# Patient Record
Sex: Female | Born: 1990
Health system: Southern US, Community
[De-identification: ages and names within clinical notes are randomized; demographics above are authoritative.]

## PROBLEM LIST (undated history)

## (undated) ENCOUNTER — Inpatient Hospital Stay (HOSPITAL_COMMUNITY): Payer: Self-pay

## (undated) DIAGNOSIS — E876 Hypokalemia: Secondary | ICD-10-CM

## (undated) DIAGNOSIS — M549 Dorsalgia, unspecified: Secondary | ICD-10-CM

## (undated) DIAGNOSIS — F329 Major depressive disorder, single episode, unspecified: Secondary | ICD-10-CM

## (undated) DIAGNOSIS — F32A Depression, unspecified: Secondary | ICD-10-CM

## (undated) DIAGNOSIS — F419 Anxiety disorder, unspecified: Secondary | ICD-10-CM

## (undated) DIAGNOSIS — R87610 Atypical squamous cells of undetermined significance on cytologic smear of cervix (ASC-US): Secondary | ICD-10-CM

## (undated) DIAGNOSIS — M419 Scoliosis, unspecified: Secondary | ICD-10-CM

## (undated) DIAGNOSIS — N739 Female pelvic inflammatory disease, unspecified: Secondary | ICD-10-CM

## (undated) DIAGNOSIS — B977 Papillomavirus as the cause of diseases classified elsewhere: Secondary | ICD-10-CM

## (undated) DIAGNOSIS — G43909 Migraine, unspecified, not intractable, without status migrainosus: Secondary | ICD-10-CM

## (undated) DIAGNOSIS — R87810 Cervical high risk human papillomavirus (HPV) DNA test positive: Secondary | ICD-10-CM

## (undated) DIAGNOSIS — D649 Anemia, unspecified: Secondary | ICD-10-CM

## (undated) HISTORY — PX: DILATION AND CURETTAGE OF UTERUS: SHX78

## (undated) HISTORY — DX: Atypical squamous cells of undetermined significance on cytologic smear of cervix (ASC-US): R87.610

## (undated) HISTORY — DX: Cervical high risk human papillomavirus (HPV) DNA test positive: R87.810

## (undated) HISTORY — PX: NO PAST SURGERIES: SHX2092

## (undated) HISTORY — DX: Hypokalemia: E87.6

---
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
## 2011-03-17 ENCOUNTER — Emergency Department (HOSPITAL_COMMUNITY)
Admission: EM | Admit: 2011-03-17 | Discharge: 2011-03-17 | Disposition: A | Discharge status: Home / Self Care | Payer: No Typology Code available for payment source | Attending: Emergency Medicine | Admitting: Emergency Medicine

## 2011-03-17 DIAGNOSIS — M545 Low back pain, unspecified: Secondary | ICD-10-CM | POA: Insufficient documentation

## 2011-05-06 ENCOUNTER — Encounter (HOSPITAL_COMMUNITY): Payer: Self-pay | Admitting: Radiology

## 2011-05-06 ENCOUNTER — Emergency Department (HOSPITAL_COMMUNITY)
Admission: EM | Admit: 2011-05-06 | Discharge: 2011-05-06 | Disposition: A | Discharge status: Home / Self Care | Payer: No Typology Code available for payment source | Attending: Emergency Medicine | Admitting: Emergency Medicine

## 2011-05-06 ENCOUNTER — Emergency Department (HOSPITAL_COMMUNITY): Payer: No Typology Code available for payment source

## 2011-05-06 DIAGNOSIS — R109 Unspecified abdominal pain: Secondary | ICD-10-CM | POA: Insufficient documentation

## 2011-05-06 DIAGNOSIS — N739 Female pelvic inflammatory disease, unspecified: Secondary | ICD-10-CM | POA: Insufficient documentation

## 2011-05-06 DIAGNOSIS — R300 Dysuria: Secondary | ICD-10-CM | POA: Insufficient documentation

## 2011-05-06 LAB — COMPREHENSIVE METABOLIC PANEL
ALT: 9 U/L (ref 0–35)
AST: 15 U/L (ref 0–37)
Albumin: 4.1 g/dL (ref 3.5–5.2)
Alkaline Phosphatase: 85 U/L (ref 39–117)
BUN: 5 mg/dL — ABNORMAL LOW (ref 6–23)
CO2: 25 mEq/L (ref 19–32)
Calcium: 10 mg/dL (ref 8.4–10.5)
Chloride: 105 mEq/L (ref 96–112)
Creatinine, Ser: 0.69 mg/dL (ref 0.50–1.10)
GFR calc Af Amer: >60 mL/min (ref >60)
GFR calc non Af Amer: >60 mL/min (ref >60)
Glucose, Bld: 101 mg/dL — ABNORMAL HIGH (ref 70–99)
Potassium: 3.4 mEq/L — ABNORMAL LOW (ref 3.5–5.1)
Sodium: 140 mEq/L (ref 135–145)
Total Bilirubin: 0.4 mg/dL (ref 0.3–1.2)
Total Protein: 7.5 g/dL (ref 6.0–8.3)

## 2011-05-06 LAB — POCT PREGNANCY, URINE: Preg Test, Ur: NEGATIVE

## 2011-05-06 LAB — URINALYSIS, ROUTINE W REFLEX MICROSCOPIC
Bilirubin Urine: NEGATIVE
Glucose, UA: NEGATIVE mg/dL
Ketones, ur: NEGATIVE mg/dL
Nitrite: NEGATIVE
Protein, ur: NEGATIVE mg/dL
Specific Gravity, Urine: 1.009 (ref 1.005–1.030)
Urobilinogen, UA: 0.2 mg/dL (ref 0.0–1.0)
pH: 6.5 (ref 5.0–8.0)

## 2011-05-06 LAB — DIFFERENTIAL
Basophils Absolute: 0.1 10*3/uL (ref 0.0–0.1)
Basophils Relative: 0 % (ref 0–1)
Eosinophils Absolute: 0.1 10*3/uL (ref 0.0–0.7)
Eosinophils Relative: 0 % (ref 0–5)
Lymphocytes Relative: 12 % (ref 12–46)
Lymphs Abs: 2.1 10*3/uL (ref 0.7–4.0)
Monocytes Absolute: 1.3 10*3/uL — ABNORMAL HIGH (ref 0.1–1.0)
Monocytes Relative: 8 % (ref 3–12)
Neutro Abs: 13.9 10*3/uL — ABNORMAL HIGH (ref 1.7–7.7)
Neutrophils Relative %: 80 % — ABNORMAL HIGH (ref 43–77)

## 2011-05-06 LAB — CBC
HCT: 37.5 % (ref 36.0–46.0)
Hemoglobin: 12.1 g/dL (ref 12.0–15.0)
MCH: 28.3 pg (ref 26.0–34.0)
MCHC: 32.3 g/dL (ref 30.0–36.0)
MCV: 87.8 fL (ref 78.0–100.0)
Platelets: 346 10*3/uL (ref 150–400)
RBC: 4.27 MIL/uL (ref 3.87–5.11)
RDW: 12.4 % (ref 11.5–15.5)
WBC: 17.4 10*3/uL — ABNORMAL HIGH (ref 4.0–10.5)

## 2011-05-06 LAB — URINE MICROSCOPIC-ADD ON

## 2011-05-06 LAB — WET PREP, GENITAL
Trich, Wet Prep: NONE SEEN
Yeast Wet Prep HPF POC: NONE SEEN

## 2011-05-06 MED ORDER — IOHEXOL 300 MG/ML  SOLN
100.0000 mL | Freq: Once | INTRAMUSCULAR | Status: AC | PRN
  Administered 2011-05-06: 100 mL via INTRAVENOUS

## 2011-05-08 LAB — GC/CHLAMYDIA PROBE AMP, GENITAL
Chlamydia, DNA Probe: NEGATIVE
GC Probe Amp, Genital: NEGATIVE

## 2011-06-14 LAB — URINALYSIS, ROUTINE W REFLEX MICROSCOPIC
Bilirubin Urine: NEGATIVE
Glucose, UA: NEGATIVE
Hgb urine dipstick: NEGATIVE
Ketones, ur: NEGATIVE
Nitrite: NEGATIVE
Protein, ur: NEGATIVE
Specific Gravity, Urine: 1.009
Urobilinogen, UA: 0.2
pH: 7

## 2011-06-14 LAB — URINE MICROSCOPIC-ADD ON

## 2011-06-14 LAB — BASIC METABOLIC PANEL
BUN: 5 — ABNORMAL LOW
CO2: 27
Calcium: 9.5
Chloride: 106
Creatinine, Ser: 0.69
Glucose, Bld: 102 — ABNORMAL HIGH
Potassium: 3.4 — ABNORMAL LOW
Sodium: 140

## 2011-06-14 LAB — PREGNANCY, URINE: Preg Test, Ur: NEGATIVE

## 2011-12-25 ENCOUNTER — Emergency Department (HOSPITAL_BASED_OUTPATIENT_CLINIC_OR_DEPARTMENT_OTHER)
Admission: EM | Admit: 2011-12-25 | Discharge: 2011-12-25 | Disposition: A | Discharge status: Home / Self Care | Payer: No Typology Code available for payment source | Attending: Emergency Medicine | Admitting: Emergency Medicine

## 2011-12-25 ENCOUNTER — Encounter (HOSPITAL_BASED_OUTPATIENT_CLINIC_OR_DEPARTMENT_OTHER): Payer: Self-pay | Admitting: Emergency Medicine

## 2011-12-25 DIAGNOSIS — Z043 Encounter for examination and observation following other accident: Secondary | ICD-10-CM | POA: Insufficient documentation

## 2011-12-25 MED ORDER — TRAMADOL HCL 50 MG PO TABS: 50.0000 mg | ORAL_TABLET | Freq: Four times a day (QID) | ORAL | Status: AC | PRN

## 2011-12-25 MED ORDER — CYCLOBENZAPRINE HCL 10 MG PO TABS: 10.0000 mg | ORAL_TABLET | Freq: Two times a day (BID) | ORAL | Status: AC | PRN
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
## 2011-12-25 NOTE — ED Provider Notes (Signed)
Medical screening examination/treatment/procedure(s) were performed by non-physician practitioner and as supervising physician I was immediately available for consultation/collaboration.   Celene Kras, MD 12/25/11 1330
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
## 2012-04-25 ENCOUNTER — Emergency Department (HOSPITAL_COMMUNITY)
Admission: EM | Admit: 2012-04-25 | Discharge: 2012-04-26 | Disposition: A | Discharge status: Home / Self Care | Payer: No Typology Code available for payment source | Attending: Emergency Medicine | Admitting: Emergency Medicine

## 2012-04-25 ENCOUNTER — Encounter (HOSPITAL_COMMUNITY): Payer: Self-pay

## 2012-04-25 ENCOUNTER — Emergency Department (HOSPITAL_COMMUNITY): Payer: No Typology Code available for payment source

## 2012-04-25 DIAGNOSIS — Y998 Other external cause status: Secondary | ICD-10-CM | POA: Insufficient documentation

## 2012-04-25 DIAGNOSIS — S134XXA Sprain of ligaments of cervical spine, initial encounter: Secondary | ICD-10-CM

## 2012-04-25 DIAGNOSIS — F17200 Nicotine dependence, unspecified, uncomplicated: Secondary | ICD-10-CM | POA: Insufficient documentation

## 2012-04-25 DIAGNOSIS — S139XXA Sprain of joints and ligaments of unspecified parts of neck, initial encounter: Secondary | ICD-10-CM | POA: Insufficient documentation

## 2012-04-25 DIAGNOSIS — Y93I9 Activity, other involving external motion: Secondary | ICD-10-CM | POA: Insufficient documentation

## 2012-04-25 MED ORDER — CYCLOBENZAPRINE HCL 10 MG PO TABS: 10.0000 mg | ORAL_TABLET | Freq: Two times a day (BID) | ORAL | Status: AC | PRN

## 2012-04-25 MED ORDER — HYDROCODONE-ACETAMINOPHEN 5-325 MG PO TABS: 1.0000 | ORAL_TABLET | Freq: Four times a day (QID) | ORAL | Status: AC | PRN

## 2012-04-25 MED ORDER — HYDROCODONE-ACETAMINOPHEN 5-325 MG PO TABS
2.0000 | ORAL_TABLET | Freq: Once | ORAL | Status: AC
  Administered 2012-04-25: 2 via ORAL
  Filled 2012-04-25: qty 2
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
## 2012-04-25 NOTE — ED Notes (Signed)
Cervical collar applied

## 2012-04-28 NOTE — ED Provider Notes (Signed)
Medical screening examination/treatment/procedure(s) were performed by non-physician practitioner and as supervising physician I was immediately available for consultation/collaboration.  Solomiya Pascale, MD 04/28/12 1508 

## 2012-05-16 ENCOUNTER — Encounter (HOSPITAL_BASED_OUTPATIENT_CLINIC_OR_DEPARTMENT_OTHER): Payer: Self-pay

## 2012-05-16 ENCOUNTER — Emergency Department (HOSPITAL_BASED_OUTPATIENT_CLINIC_OR_DEPARTMENT_OTHER)
Admission: EM | Admit: 2012-05-16 | Discharge: 2012-05-16 | Disposition: A | Discharge status: Home / Self Care | Payer: BC Managed Care – PPO | Attending: Emergency Medicine | Admitting: Emergency Medicine

## 2012-05-16 DIAGNOSIS — L089 Local infection of the skin and subcutaneous tissue, unspecified: Secondary | ICD-10-CM | POA: Insufficient documentation

## 2012-05-16 DIAGNOSIS — F17200 Nicotine dependence, unspecified, uncomplicated: Secondary | ICD-10-CM | POA: Insufficient documentation

## 2012-05-16 MED ORDER — SULFAMETHOXAZOLE-TRIMETHOPRIM 800-160 MG PO TABS: 1.0000 | ORAL_TABLET | Freq: Two times a day (BID) | ORAL | Status: AC

## 2012-05-16 MED ORDER — HYDROCODONE-ACETAMINOPHEN 5-325 MG PO TABS: 2.0000 | ORAL_TABLET | ORAL | Status: AC | PRN

## 2012-05-16 MED ORDER — CHLORHEXIDINE GLUCONATE 4 % EX LIQD: 60.0000 mL | Freq: Every day | CUTANEOUS | Status: AC | PRN

## 2012-05-16 NOTE — ED Notes (Signed)
Pt reports "i found a blood blister on my right breast" 4 days ago

## 2012-05-16 NOTE — ED Provider Notes (Signed)
Medical screening examination/treatment/procedure(s) were performed by non-physician practitioner and as supervising physician I was immediately available for consultation/collaboration.   Richardean Canal, MD 05/16/12 [phone]

## 2012-05-16 NOTE — ED Provider Notes (Signed)
History     CSN: [id number]  Arrival date & time 05/16/12  1135   First MD Initiated Contact with Patient 05/16/12 1205      Chief Complaint  Patient presents with  . Breast Pain    (Consider location/radiation/quality/duration/timing/severity/associated sxs/prior treatment) Patient is a 21 y.o. female presenting with rash. The history is provided by the patient. No language interpreter was used.  Rash  This is a new problem. The problem has been gradually worsening. The problem is associated with nothing. There has been no fever. The fever has been present for 1 to 2 days. The rash is present on the torso. The pain is at a severity of 5/10. The pain is moderate. The pain has been constant since onset. Associated symptoms include blisters. She has tried nothing for the symptoms. The treatment provided no relief.  Pt complains of a swollen area 2 right breast.   History reviewed. No pertinent past medical history.  History reviewed. No pertinent past surgical history.  No family history on file.  History  Substance Use Topics  . Smoking status: Current Everyday Smoker  . Smokeless tobacco: Not on file  . Alcohol Use: No    OB History    Grav Para Term Preterm Abortions TAB SAB Ect Mult Living                  Review of Systems  Skin: Positive for rash.  All other systems reviewed and are negative.    Allergies  Review of patient's allergies indicates no known allergies.  Home Medications   Current Outpatient Rx  Name Route Sig Dispense Refill  . CHLORHEXIDINE GLUCONATE 4 % EX LIQD Topical Apply 60 mLs (4 application total) topically daily as needed. 120 mL 0  . HYDROCODONE-ACETAMINOPHEN 5-325 MG PO TABS Oral Take 2 tablets by mouth every 4 (four) hours as needed for pain. 10 tablet 0  . SULFAMETHOXAZOLE-TRIMETHOPRIM 800-160 MG PO TABS Oral Take 1 tablet by mouth 2 (two) times daily. 20 tablet 0    BP 116/70  Pulse 93  Resp 16  Ht 5\' 1"  (1.549 m)  Wt 102 lb  (46.267 kg)  BMI 19.27 kg/m2  SpO2 100%  LMP 04/19/2012  Physical Exam  Vitals reviewed. Constitutional: She is oriented to person, place, and time. She appears well-developed and well-nourished.  HENT:  Head: Normocephalic and atraumatic.  Eyes: EOM are normal.  Neck: Normal range of motion.  Pulmonary/Chest: Effort normal.       Pimples right breast, swollen red area, no abscess  Abdominal: She exhibits no distension.  Musculoskeletal: Normal range of motion.  Neurological: She is alert and oriented to person, place, and time.  Psychiatric: She has a normal mood and affect.    ED Course  Procedures (including critical care time)  Labs Reviewed - No data to display No results found.   1. Skin infection       MDM  Pt has multiple pimples and pustules,  Pt given rx for bactrim and hibiclens rs       Lonia Skinner [place], [place] 05/16/12 1229

## 2012-07-23 ENCOUNTER — Encounter (HOSPITAL_BASED_OUTPATIENT_CLINIC_OR_DEPARTMENT_OTHER): Payer: Self-pay | Admitting: Student

## 2012-07-23 ENCOUNTER — Emergency Department (HOSPITAL_BASED_OUTPATIENT_CLINIC_OR_DEPARTMENT_OTHER)
Admission: EM | Admit: 2012-07-23 | Discharge: 2012-07-23 | Disposition: A | Discharge status: Home / Self Care | Payer: BC Managed Care – PPO | Attending: Emergency Medicine | Admitting: Emergency Medicine

## 2012-07-23 DIAGNOSIS — F17200 Nicotine dependence, unspecified, uncomplicated: Secondary | ICD-10-CM | POA: Insufficient documentation

## 2012-07-23 DIAGNOSIS — N649 Disorder of breast, unspecified: Secondary | ICD-10-CM | POA: Insufficient documentation

## 2012-07-23 DIAGNOSIS — N644 Mastodynia: Secondary | ICD-10-CM

## 2012-07-23 NOTE — ED Provider Notes (Signed)
History     CSN: [id number]  Arrival date & time 07/23/12  1204   First MD Initiated Contact with Patient 07/23/12 1244      Chief Complaint  Patient presents with  . Breast Discharge  . Breast Problem    Swelling to Right lower breast    (Consider location/radiation/quality/duration/timing/severity/associated sxs/prior treatment) HPI Comments: Pt complains of a swollen area right breat.  Pt reports area is tender and she feels a knot.  Pt reports I saw her in August and treated with antibiotics.  Pt reports area has continued to stay swollen.  Pt reports discharge from that breast.  Discharge started 3 weeks ago.  Pt denies any fever or chills.    The history is provided by the patient. No language interpreter was used.    History reviewed. No pertinent past medical history.  History reviewed. No pertinent past surgical history.  History reviewed. No pertinent family history.  History  Substance Use Topics  . Smoking status: Current Every Day Smoker  . Smokeless tobacco: Not on file  . Alcohol Use: No    OB History    Grav Para Term Preterm Abortions TAB SAB Ect Mult Living                  Review of Systems  All other systems reviewed and are negative.    Allergies  Review of patient's allergies indicates no known allergies.  Home Medications  No current outpatient prescriptions on file.  BP 101/82  Pulse 89  Temp 98.4 F (36.9 C) (Oral)  Resp 16  Ht 5' (1.524 m)  Wt 102 lb (46.267 kg)  BMI 19.92 kg/m2  SpO2 100%  LMP 07/23/2012  Physical Exam  Nursing note and vitals reviewed. Constitutional: She is oriented to person, place, and time. She appears well-developed and well-nourished.  HENT:  Head: Normocephalic.  Cardiovascular: Normal rate and normal heart sounds.   Pulmonary/Chest: Effort normal.       Right breast no obvious mass,  Possible increased area of swelling.    Neurological: She is alert and oriented to person, place, and time. She  has normal reflexes.  Skin: Skin is warm.    ED Course  Procedures (including critical care time)  Labs Reviewed - No data to display No results found.   1. Breast pain, right       MDM  Dr. Karma Ganja in to see.   Pt advised to follow up at the breast clinic for further evaluation.   Pt given primary care referral sheet.        Lonia Skinner [place], [place] 07/23/12 1431

## 2012-07-23 NOTE — ED Provider Notes (Signed)
Medical screening examination/treatment/procedure(s) were conducted as a shared visit with non-physician practitioner(s) and myself.  I personally evaluated the patient during the encounter  Pt seen and evaluated, approx 2cm area of prominence on upper right breast, no overlying erythema, no discharge expressed from nipple.  Pt referred to breast center for further imaging and evaluation.    Ethelda Chick, MD 07/23/12 [phone]

## 2012-07-23 NOTE — ED Notes (Signed)
Pt in with c/o right breast swelling with clear dc since aug 2013. Reports change to milky dc x 2-3 weeks. Right lower breast swelling with possible "knot" noted by pt. Pt unsure if "knot" is fixed or freely moveable. Pt reports tenderness to palpation.

## 2012-07-27 ENCOUNTER — Telehealth (HOSPITAL_BASED_OUTPATIENT_CLINIC_OR_DEPARTMENT_OTHER): Payer: Self-pay | Admitting: *Deleted

## 2012-07-27 NOTE — ED Provider Notes (Signed)
Patient has been told that she cannot be seen at [facility] until she has had an ultrasound of her breast. Ultrasound has been ordered.  Dione Booze, MD 07/27/12 [phone]

## 2012-07-27 NOTE — ED Notes (Signed)
Patient called to state she was unable to schedule an appointment with [facility] due to lack of breast Ultrasound.  Chart reviewed with Dr. Preston Fleeting.  Order placed for [place] of right breast for breast pain.  Call placed to [facility] with referral.  Call placed to patient with info and phone #.

## 2012-07-28 ENCOUNTER — Ambulatory Visit
Admission: RE | Admit: 2012-07-28 | Discharge: 2012-07-28 | Disposition: A | Discharge status: Home / Self Care | Payer: BC Managed Care – PPO | Source: Ambulatory Visit | Attending: Emergency Medicine | Admitting: Emergency Medicine

## 2012-07-28 DIAGNOSIS — N644 Mastodynia: Secondary | ICD-10-CM

## 2012-08-23 ENCOUNTER — Emergency Department (HOSPITAL_BASED_OUTPATIENT_CLINIC_OR_DEPARTMENT_OTHER)
Admission: EM | Admit: 2012-08-23 | Discharge: 2012-08-23 | Disposition: A | Discharge status: Home / Self Care | Payer: BC Managed Care – PPO | Attending: Emergency Medicine | Admitting: Emergency Medicine

## 2012-08-23 ENCOUNTER — Encounter (HOSPITAL_BASED_OUTPATIENT_CLINIC_OR_DEPARTMENT_OTHER): Payer: Self-pay | Admitting: *Deleted

## 2012-08-23 DIAGNOSIS — Y929 Unspecified place or not applicable: Secondary | ICD-10-CM | POA: Insufficient documentation

## 2012-08-23 DIAGNOSIS — L089 Local infection of the skin and subcutaneous tissue, unspecified: Secondary | ICD-10-CM

## 2012-08-23 DIAGNOSIS — IMO0002 Reserved for concepts with insufficient information to code with codable children: Secondary | ICD-10-CM

## 2012-08-23 DIAGNOSIS — W269XXA Contact with unspecified sharp object(s), initial encounter: Secondary | ICD-10-CM | POA: Insufficient documentation

## 2012-08-23 DIAGNOSIS — Y939 Activity, unspecified: Secondary | ICD-10-CM | POA: Insufficient documentation

## 2012-08-23 DIAGNOSIS — S61209A Unspecified open wound of unspecified finger without damage to nail, initial encounter: Secondary | ICD-10-CM | POA: Insufficient documentation

## 2012-08-23 DIAGNOSIS — F17200 Nicotine dependence, unspecified, uncomplicated: Secondary | ICD-10-CM | POA: Insufficient documentation

## 2012-08-23 MED ORDER — CEPHALEXIN 500 MG PO CAPS: 500.0000 mg | ORAL_CAPSULE | Freq: Two times a day (BID) | ORAL | Status: DC

## 2012-08-23 MED ORDER — LIDOCAINE HCL 2 % IJ SOLN
20.0000 mL | Freq: Once | INTRAMUSCULAR | Status: AC
  Administered 2012-08-23: 400 mg
  Filled 2012-08-23: qty 20

## 2012-08-23 NOTE — ED Provider Notes (Signed)
History     CSN: [id number]  Arrival date & time 08/23/12  1437   First MD Initiated Contact with Patient 08/23/12 1619      Chief Complaint  Patient presents with  . Wound Infection    (Consider location/radiation/quality/duration/timing/severity/associated sxs/prior treatment) HPI Comments: Pt states that she had a piercing to the left middle finger and the area has started to have some redness:pt is here to have the piercing removed  The history is provided by the patient. No language interpreter was used.    History reviewed. No pertinent past medical history.  History reviewed. No pertinent past surgical history.  History reviewed. No pertinent family history.  History  Substance Use Topics  . Smoking status: Current Every Day Smoker  . Smokeless tobacco: Not on file  . Alcohol Use: No    OB History    Grav Para Term Preterm Abortions TAB SAB Ect Mult Living                  Review of Systems  Constitutional: Negative.   Respiratory: Negative.   Cardiovascular: Negative.     Allergies  Review of patient's allergies indicates no known allergies.  Home Medications  No current outpatient prescriptions on file.  BP 109/70  Pulse 71  Temp 98.3 F (36.8 C)  Resp 16  Ht 5' (1.524 m)  Wt 99 lb (44.906 kg)  BMI 19.33 kg/m2  SpO2 100%  LMP 08/23/2012  Physical Exam  Nursing note and vitals reviewed. Cardiovascular: Normal rate and regular rhythm.   Pulmonary/Chest: Effort normal and breath sounds normal.  Musculoskeletal:       Pt has localized redness or dorsal aspect of the base of left middle finger:pt has full rom without drainage:pt has a piercing to the area    ED Course  FOREIGN BODY REMOVAL Performed by: Teressa Lower Authorized by: Teressa Lower Consent: Verbal consent obtained. Consent given by: patient Patient identity confirmed: verbally with patient Time out: Immediately prior to procedure a "time out" was called to verify  the correct patient, procedure, equipment, support staff and site/side marked as required. Body area: skin General location: upper extremity Location details: left long finger Anesthesia: digital block Local anesthetic: lidocaine 2% without epinephrine Removal mechanism: scalpel Dressing: dressing applied and antibiotic ointment Depth: subcutaneous Complexity: simple Post-procedure assessment: foreign body removed Patient tolerance: Patient tolerated the procedure well with no immediate complications.   (including critical care time)  Labs Reviewed - No data to display No results found.   1. Skin infection   2. Foreign body       MDM  pts tetanus is NWG:NFAOZHYQ removed        Teressa Lower, NP 08/23/12 1728

## 2012-08-23 NOTE — ED Notes (Signed)
Pt c/o left 3rd finger infection from dermal piercing .

## 2012-08-23 NOTE — ED Provider Notes (Signed)
Medical screening examination/treatment/procedure(s) were performed by non-physician practitioner and as supervising physician I was immediately available for consultation/collaboration.   Jakobi Thetford, MD 08/23/12 2315 

## 2012-12-18 ENCOUNTER — Encounter (HOSPITAL_BASED_OUTPATIENT_CLINIC_OR_DEPARTMENT_OTHER): Payer: Self-pay | Admitting: Student

## 2012-12-18 ENCOUNTER — Emergency Department (HOSPITAL_BASED_OUTPATIENT_CLINIC_OR_DEPARTMENT_OTHER)
Admission: EM | Admit: 2012-12-18 | Discharge: 2012-12-18 | Disposition: A | Discharge status: Home / Self Care | Payer: BC Managed Care – PPO | Attending: Emergency Medicine | Admitting: Emergency Medicine

## 2012-12-18 DIAGNOSIS — F17200 Nicotine dependence, unspecified, uncomplicated: Secondary | ICD-10-CM | POA: Insufficient documentation

## 2012-12-18 DIAGNOSIS — G5600 Carpal tunnel syndrome, unspecified upper limb: Secondary | ICD-10-CM | POA: Insufficient documentation

## 2012-12-18 DIAGNOSIS — R209 Unspecified disturbances of skin sensation: Secondary | ICD-10-CM | POA: Insufficient documentation

## 2012-12-18 DIAGNOSIS — G5601 Carpal tunnel syndrome, right upper limb: Secondary | ICD-10-CM

## 2012-12-18 MED ORDER — IBUPROFEN 800 MG PO TABS: 800.0000 mg | ORAL_TABLET | Freq: Three times a day (TID) | ORAL | Status: DC | PRN

## 2012-12-18 NOTE — ED Provider Notes (Signed)
History     CSN: [id number]  Arrival date & time 12/18/12  1056   First MD Initiated Contact with Patient 12/18/12 1117      Chief Complaint  Patient presents with  . Wrist Pain    right wrist    (Consider location/radiation/quality/duration/timing/severity/associated sxs/prior treatment) Patient is a 22 y.o. female presenting with wrist pain.  Wrist Pain   Pt reports increasing pain in R wrist for the last 3-4 weeks, now associated with intermittent numbness in R hand. Pain occasionally radiates up her arm, worse at night and while she is at work. Denies any trauma/injury. No prior history of same.   History reviewed. No pertinent past medical history.  History reviewed. No pertinent past surgical history.  History reviewed. No pertinent family history.  History  Substance Use Topics  . Smoking status: Current Every Day Smoker  . Smokeless tobacco: Not on file  . Alcohol Use: No    OB History   Grav Para Term Preterm Abortions TAB SAB Ect Mult Living                  Review of Systems All other systems reviewed and are negative except as noted in HPI.   Allergies  Review of patient's allergies indicates no known allergies.  Home Medications  No current outpatient prescriptions on file.  BP 114/65  Pulse 59  Temp(Src) 98.8 F (37.1 C) (Oral)  Resp 16  Ht 5' (1.524 m)  Wt 100 lb (45.36 kg)  BMI 19.53 kg/m2  SpO2 100%  LMP 11/18/2012  Physical Exam  Constitutional: She is oriented to person, place, and time. She appears well-developed and well-nourished.  HENT:  Head: Normocephalic and atraumatic.  Neck: Neck supple.  Pulmonary/Chest: Effort normal.  Musculoskeletal: Normal range of motion. She exhibits tenderness (Positive Phalen's and Tinnel's signs). She exhibits no edema.  Distribution of paresthesias is not consistent with median nerve but she is unsure where the numbness was previously. Now only tip of R middle finger  Neurological: She is  alert and oriented to person, place, and time. No cranial nerve deficit.  Psychiatric: She has a normal mood and affect. Her behavior is normal.    ED Course  Procedures (including critical care time)  Labs Reviewed - No data to display No results found.   1. Carpal tunnel syndrome, right       MDM  Likely carpal tunnel syndrome. Advised wrist splint, NSAIDs, rest and Hand followup if not improving.         Charles B. Bernette Mayers, MD 12/18/12 1144

## 2012-12-18 NOTE — ED Notes (Signed)
Pt in with c/o right wrist pain x 2 weeks with reports of numbness and tingling in right hand and finds this past weekend. Pt able to move right hand and fingers without incident.

## 2013-03-06 ENCOUNTER — Encounter (HOSPITAL_BASED_OUTPATIENT_CLINIC_OR_DEPARTMENT_OTHER): Payer: Self-pay

## 2013-03-06 ENCOUNTER — Emergency Department (HOSPITAL_BASED_OUTPATIENT_CLINIC_OR_DEPARTMENT_OTHER)
Admission: EM | Admit: 2013-03-06 | Discharge: 2013-03-06 | Disposition: A | Discharge status: Home / Self Care | Payer: BC Managed Care – PPO | Attending: Emergency Medicine | Admitting: Emergency Medicine

## 2013-03-06 DIAGNOSIS — G43909 Migraine, unspecified, not intractable, without status migrainosus: Secondary | ICD-10-CM | POA: Insufficient documentation

## 2013-03-06 DIAGNOSIS — F17200 Nicotine dependence, unspecified, uncomplicated: Secondary | ICD-10-CM | POA: Insufficient documentation

## 2013-03-06 DIAGNOSIS — G8929 Other chronic pain: Secondary | ICD-10-CM | POA: Insufficient documentation

## 2013-03-06 MED ORDER — SUMATRIPTAN SUCCINATE 100 MG PO TABS: 100.0000 mg | ORAL_TABLET | ORAL | Status: DC | PRN

## 2013-03-06 NOTE — ED Notes (Signed)
C/o HA across forehead and "down into my sinuses" x 2 days-deneis n/v, +photophobia

## 2013-03-06 NOTE — ED Provider Notes (Signed)
History    CSN: [id number] Arrival date & time 03/06/13  1344 First MD Initiated Contact with Patient 03/06/13 1400     Chief Complaint  Patient presents with  . Headache   HPI Comments: Headaches ongoing for over a year and a half.  They will last days at a time.  Usually one to two times per month.  When they occur they last for days.    Patient is a 22 y.o. female presenting with headaches. The history is provided by the patient.  Headache Pain location:  Frontal Radiates to: moves around sinuses and eyes. Timing:  Constant Chronicity:  Chronic Similar to prior headaches: yes   Context: bright light and loud noise   Associated symptoms: no fever and no neck pain    the patient has never seen anyone for this trouble before. Her mother finally convinced her to get this checked out. It hasn't particularly changed. History reviewed. No pertinent past medical history.  History reviewed. No pertinent past surgical history.  No family history on file.  History  Substance Use Topics  . Smoking status: Current Every Day Smoker  . Smokeless tobacco: Not on file  . Alcohol Use: No    OB History   Grav Para Term Preterm Abortions TAB SAB Ect Mult Living                  Review of Systems  Constitutional: Negative for fever.  HENT: Negative for neck pain.   Neurological: Positive for headaches.  All other systems reviewed and are negative.    Allergies  Review of patient's allergies indicates no known allergies.  Home Medications   Current Outpatient Rx  Name  Route  Sig  Dispense  Refill  . NAPROXEN PO   Oral   Take by mouth.         Marland Kitchen ibuprofen (ADVIL,MOTRIN) 800 MG tablet   Oral   Take 1 tablet (800 mg total) by mouth every 8 (eight) hours as needed for pain.   30 tablet   0     BP 122/76  Pulse 61  Temp(Src) 98.1 F (36.7 C) (Oral)  Resp 20  SpO2 100%  LMP 02/21/2013  Physical Exam  Nursing note and vitals reviewed. Constitutional: She is  oriented to person, place, and time. She appears well-developed and well-nourished. No distress.  HENT:  Head: Normocephalic and atraumatic.  Right Ear: External ear normal.  Left Ear: External ear normal.  Mouth/Throat: Oropharynx is clear and moist.  Eyes: Conjunctivae are normal. Right eye exhibits no discharge. Left eye exhibits no discharge. No scleral icterus.  Neck: Neck supple. No tracheal deviation present.  Cardiovascular: Normal rate, regular rhythm and intact distal pulses.   Pulmonary/Chest: Effort normal and breath sounds normal. No stridor. No respiratory distress. She has no wheezes. She has no rales.  Abdominal: Soft. Bowel sounds are normal. She exhibits no distension. There is no tenderness. There is no rebound and no guarding.  Musculoskeletal: She exhibits no edema and no tenderness.  Neurological: She is alert and oriented to person, place, and time. She has normal strength. No cranial nerve deficit ( no gross defecits noted) or sensory deficit. She exhibits normal muscle tone. She displays no seizure activity. Coordination normal.  No pronator drift bilateral upper extrem, able to hold both legs off bed for 5 seconds, sensation intact in all extremities, no visual field cuts, no left or right sided neglect  Skin: Skin is warm and dry. No rash  noted.  Psychiatric: She has a normal mood and affect.    ED Course  Procedures (including critical care time)  Labs Reviewed - No data to display No results found.    MDM  patient does have a normal neurologic exam. She does not appear to be in any distress. I doubt brain tumor, acute infection, subarachnoid hemorrhage or other emergency medical condition. I suspect she may have migraine headaches considering the chronicity and pattern she describes. I will discharge her home on a course of Imitrex. The primary care doctor or neurologist to make sure this treatment is effective and to discuss other options       Celene Kras, MD 03/06/13 [id number]

## 2013-04-24 ENCOUNTER — Encounter (HOSPITAL_BASED_OUTPATIENT_CLINIC_OR_DEPARTMENT_OTHER): Payer: Self-pay | Admitting: *Deleted

## 2013-04-24 ENCOUNTER — Emergency Department (HOSPITAL_BASED_OUTPATIENT_CLINIC_OR_DEPARTMENT_OTHER)
Admission: EM | Admit: 2013-04-24 | Discharge: 2013-04-24 | Disposition: A | Discharge status: Home / Self Care | Payer: BC Managed Care – PPO | Attending: Emergency Medicine | Admitting: Emergency Medicine

## 2013-04-24 DIAGNOSIS — Y92838 Other recreation area as the place of occurrence of the external cause: Secondary | ICD-10-CM | POA: Insufficient documentation

## 2013-04-24 DIAGNOSIS — T148XXA Other injury of unspecified body region, initial encounter: Secondary | ICD-10-CM

## 2013-04-24 DIAGNOSIS — F17200 Nicotine dependence, unspecified, uncomplicated: Secondary | ICD-10-CM | POA: Insufficient documentation

## 2013-04-24 DIAGNOSIS — X58XXXA Exposure to other specified factors, initial encounter: Secondary | ICD-10-CM | POA: Insufficient documentation

## 2013-04-24 DIAGNOSIS — S335XXA Sprain of ligaments of lumbar spine, initial encounter: Secondary | ICD-10-CM | POA: Insufficient documentation

## 2013-04-24 DIAGNOSIS — Y9239 Other specified sports and athletic area as the place of occurrence of the external cause: Secondary | ICD-10-CM | POA: Insufficient documentation

## 2013-04-24 DIAGNOSIS — Y9389 Activity, other specified: Secondary | ICD-10-CM | POA: Insufficient documentation

## 2013-04-24 MED ORDER — CYCLOBENZAPRINE HCL 5 MG PO TABS: 5.0000 mg | ORAL_TABLET | Freq: Two times a day (BID) | ORAL | Status: DC | PRN

## 2013-04-24 NOTE — ED Notes (Signed)
Lower back and right shoulder pain x 3 days since going to amusement park. Things she was slung around on the rides causing the pain.

## 2013-04-24 NOTE — ED Provider Notes (Signed)
  CSN: [id number]     Arrival date & time 04/24/13  1423 History     First MD Initiated Contact with Patient 04/24/13 1440     Chief Complaint  Patient presents with  . Back Pain   (Consider location/radiation/quality/duration/timing/severity/associated sxs/prior Treatment) HPI Comments: Pt states that she was riding rides 5 days ago  And has had pain for the last 4 days  Patient is a 22 y.o. female presenting with back pain. The history is provided by the patient. No language interpreter was used.  Back Pain Location:  Lumbar spine Quality:  Aching Radiates to:  Does not radiate Pain severity:  Moderate Onset quality:  Gradual Duration:  2 days Timing:  Constant Progression:  Unable to specify Relieved by:  Nothing Worsened by:  Nothing tried   History reviewed. No pertinent past medical history. History reviewed. No pertinent past surgical history. No family history on file. History  Substance Use Topics  . Smoking status: Current Every Day Smoker  . Smokeless tobacco: Not on file  . Alcohol Use: No   OB History   Grav Para Term Preterm Abortions TAB SAB Ect Mult Living                 Review of Systems  Constitutional: Negative.   Respiratory: Negative.   Cardiovascular: Negative.   Musculoskeletal: Positive for back pain.    Allergies  Review of patient's allergies indicates no known allergies.  Home Medications   Current Outpatient Rx  Name  Route  Sig  Dispense  Refill  . cyclobenzaprine (FLEXERIL) 5 MG tablet   Oral   Take 1 tablet (5 mg total) by mouth 2 (two) times daily as needed for muscle spasms.   20 tablet   0   . ibuprofen (ADVIL,MOTRIN) 800 MG tablet   Oral   Take 1 tablet (800 mg total) by mouth every 8 (eight) hours as needed for pain.   30 tablet   0   . NAPROXEN PO   Oral   Take by mouth.         . SUMAtriptan (IMITREX) 100 MG tablet   Oral   Take 1 tablet (100 mg total) by mouth every 2 (two) hours as needed for migraine.  Max 200 mg per 24 hour period   10 tablet   0    BP 124/84  Pulse 96  Temp(Src) 98.7 F (37.1 C) (Oral)  Resp 20  Ht 5' (1.524 m)  Wt 100 lb (45.36 kg)  BMI 19.53 kg/m2  SpO2 100%  LMP 04/20/2013 Physical Exam  Nursing note and vitals reviewed. Constitutional: She is oriented to person, place, and time. She appears well-developed and well-nourished.  HENT:  Head: Normocephalic and atraumatic.  Eyes: EOM are normal.  Neck: Neck supple.  Cardiovascular: Normal rate and regular rhythm.   Pulmonary/Chest: Effort normal and breath sounds normal.  Musculoskeletal: Normal range of motion.  Lumbar paraspinal tenderness:posterior right shoulder pain:pt has full rom  Neurological: She is alert and oriented to person, place, and time.  Skin: Skin is warm and dry.  Psychiatric: She has a normal mood and affect.    ED Course   Procedures (including critical care time)  Labs Reviewed - No data to display No results found. 1. Muscle strain     MDM  Will treat symptomatically:pt doesn't need imaging at this time  Teressa Lower, NP 04/24/13 1503

## 2013-04-24 NOTE — ED Provider Notes (Signed)
Medical screening examination/treatment/procedure(s) were performed by non-physician practitioner and as supervising physician I was immediately available for consultation/collaboration.  Derwood Kaplan, MD 04/24/13 1616

## 2013-05-11 ENCOUNTER — Ambulatory Visit: Payer: BC Managed Care – PPO | Admitting: Family Medicine

## 2013-06-04 ENCOUNTER — Emergency Department (HOSPITAL_BASED_OUTPATIENT_CLINIC_OR_DEPARTMENT_OTHER)
Admission: EM | Admit: 2013-06-04 | Discharge: 2013-06-04 | Disposition: A | Discharge status: Home / Self Care | Payer: BC Managed Care – PPO | Attending: Emergency Medicine | Admitting: Emergency Medicine

## 2013-06-04 ENCOUNTER — Encounter (HOSPITAL_BASED_OUTPATIENT_CLINIC_OR_DEPARTMENT_OTHER): Payer: Self-pay | Admitting: Student

## 2013-06-04 DIAGNOSIS — B9789 Other viral agents as the cause of diseases classified elsewhere: Secondary | ICD-10-CM

## 2013-06-04 DIAGNOSIS — R509 Fever, unspecified: Secondary | ICD-10-CM | POA: Insufficient documentation

## 2013-06-04 DIAGNOSIS — F17200 Nicotine dependence, unspecified, uncomplicated: Secondary | ICD-10-CM | POA: Insufficient documentation

## 2013-06-04 DIAGNOSIS — R110 Nausea: Secondary | ICD-10-CM | POA: Insufficient documentation

## 2013-06-04 DIAGNOSIS — J029 Acute pharyngitis, unspecified: Secondary | ICD-10-CM | POA: Insufficient documentation

## 2013-06-04 DIAGNOSIS — J988 Other specified respiratory disorders: Secondary | ICD-10-CM | POA: Insufficient documentation

## 2013-06-04 DIAGNOSIS — R5381 Other malaise: Secondary | ICD-10-CM | POA: Insufficient documentation

## 2013-06-04 LAB — CBC
HCT: 41 % (ref 36.0–46.0)
Hemoglobin: 13.4 g/dL (ref 12.0–15.0)
RBC: 4.54 MIL/uL (ref 3.87–5.11)
WBC: 5.8 10*3/uL (ref 4.0–10.5)

## 2013-06-04 NOTE — ED Provider Notes (Signed)
CSN: [id number]     Arrival date & time 06/04/13  1251 History   First MD Initiated Contact with Patient 06/04/13 1337     Chief Complaint  Patient presents with  . URI   (Consider location/radiation/quality/duration/timing/severity/associated sxs/prior Treatment) HPI Complains of cough nasal congestion sore throat, fatigue and nausea onset 4 days ago. Symptoms accompanied by subjective fever. No vomiting. No abdominal pain. No diarrhea. No chest pain. No other associated symptoms. . Sore throat is worse with swallowing. Nothing makes other symptoms better or worse No treatment prior to coming here History reviewed. No pertinent past medical history. History reviewed. No pertinent past surgical history. past medical history is negative History reviewed. No pertinent family history. History  Substance Use Topics  . Smoking status: Current Every Day Smoker  . Smokeless tobacco: Not on file  . Alcohol Use: No   OB History   Grav Para Term Preterm Abortions TAB SAB Ect Mult Living                 Review of Systems  Constitutional: Positive for fatigue.  HENT: Positive for congestion and sore throat.   Respiratory: Negative.   Cardiovascular: Negative.   Gastrointestinal: Positive for nausea.  Musculoskeletal: Negative.   Skin: Negative.   Neurological: Negative.   Psychiatric/Behavioral: Negative.   All other systems reviewed and are negative.    Allergies  Review of patient's allergies indicates no known allergies.  Home Medications   Current Outpatient Rx  Name  Route  Sig  Dispense  Refill  . cyclobenzaprine (FLEXERIL) 5 MG tablet   Oral   Take 1 tablet (5 mg total) by mouth 2 (two) times daily as needed for muscle spasms.   20 tablet   0   . ibuprofen (ADVIL,MOTRIN) 800 MG tablet   Oral   Take 1 tablet (800 mg total) by mouth every 8 (eight) hours as needed for pain.   30 tablet   0   . NAPROXEN PO   Oral   Take by mouth.         . SUMAtriptan (IMITREX)  100 MG tablet   Oral   Take 1 tablet (100 mg total) by mouth every 2 (two) hours as needed for migraine. Max 200 mg per 24 hour period   10 tablet   0    BP 118/70  Pulse 60  Temp(Src) 99.5 F (37.5 C) (Oral)  Resp 20  Wt 100 lb (45.36 kg)  BMI 19.53 kg/m2  LMP 05/21/2013 Physical Exam  Nursing note and vitals reviewed. Constitutional: She appears well-developed and well-nourished. No distress.  HENT:  Head: Normocephalic and atraumatic.  Right Ear: External ear normal.  Left Ear: External ear normal.  Nose: Nose normal.  Mouth/Throat: No oropharyngeal exudate.  Oral pharynx reddened uvula midline. Handling secretions well. Bilateral tympanic membranes normal  Eyes: Conjunctivae are normal. Pupils are equal, round, and reactive to light.  Neck: Neck supple. No tracheal deviation present. No thyromegaly present.  Cardiovascular: Normal rate and regular rhythm.   No murmur heard. Pulmonary/Chest: Effort normal and breath sounds normal.  Abdominal: Soft. Bowel sounds are normal. She exhibits no distension. There is no tenderness.  No splenomegaly  Musculoskeletal: Normal range of motion. She exhibits no edema and no tenderness.  Lymphadenopathy:    She has no cervical adenopathy.  Neurological: She is alert. Coordination normal.  Skin: Skin is warm and dry. No rash noted.  Psychiatric: She has a normal mood and affect.   Results  for orders placed during the hospital encounter of 06/04/13  CBC      Result Value Range   WBC 5.8  4.0 - 10.5 K/uL   RBC 4.54  3.87 - 5.11 MIL/uL   Hemoglobin 13.4  12.0 - 15.0 g/dL   HCT 78.2  95.6 - 21.3 %   MCV 90.3  78.0 - 100.0 fL   MCH 29.5  26.0 - 34.0 pg   MCHC 32.7  30.0 - 36.0 g/dL   RDW 08.6  57.8 - 46.9 %   Platelets 287  150 - 400 K/uL  MONONUCLEOSIS SCREEN      Result Value Range   Mono Screen NEGATIVE  NEGATIVE   No results found.  ED Course  Procedures (including critical care time) Labs Review Labs Reviewed - No data  to display Imaging Review No results found. 3 PM patient resting comfortably. No distress. MDM  No diagnosis found. Spent 5 minutes counseling patient on smoking cessation Plan expectant management Referral wellness Center Diagnosis #1 viral respiratory illness #2 tobacco abuse    Doug Sou, MD 06/04/13 1506

## 2013-06-04 NOTE — ED Notes (Signed)
Cold symptoms - nasal congestion, headaches, cough, upper chest congestion

## 2013-06-28 ENCOUNTER — Telehealth: Payer: Self-pay

## 2013-06-28 ENCOUNTER — Emergency Department (HOSPITAL_BASED_OUTPATIENT_CLINIC_OR_DEPARTMENT_OTHER)
Admission: EM | Admit: 2013-06-28 | Discharge: 2013-06-28 | Disposition: A | Discharge status: Home / Self Care | Payer: BC Managed Care – PPO | Attending: Emergency Medicine | Admitting: Emergency Medicine

## 2013-06-28 ENCOUNTER — Emergency Department (HOSPITAL_BASED_OUTPATIENT_CLINIC_OR_DEPARTMENT_OTHER): Payer: BC Managed Care – PPO

## 2013-06-28 ENCOUNTER — Encounter (HOSPITAL_BASED_OUTPATIENT_CLINIC_OR_DEPARTMENT_OTHER): Payer: Self-pay | Admitting: Emergency Medicine

## 2013-06-28 DIAGNOSIS — Y929 Unspecified place or not applicable: Secondary | ICD-10-CM | POA: Insufficient documentation

## 2013-06-28 DIAGNOSIS — X500XXA Overexertion from strenuous movement or load, initial encounter: Secondary | ICD-10-CM | POA: Insufficient documentation

## 2013-06-28 DIAGNOSIS — S93409A Sprain of unspecified ligament of unspecified ankle, initial encounter: Secondary | ICD-10-CM | POA: Insufficient documentation

## 2013-06-28 DIAGNOSIS — S93401A Sprain of unspecified ligament of right ankle, initial encounter: Secondary | ICD-10-CM

## 2013-06-28 DIAGNOSIS — Y9301 Activity, walking, marching and hiking: Secondary | ICD-10-CM | POA: Insufficient documentation

## 2013-06-28 DIAGNOSIS — F17200 Nicotine dependence, unspecified, uncomplicated: Secondary | ICD-10-CM | POA: Insufficient documentation

## 2013-06-28 MED ORDER — IBUPROFEN 800 MG PO TABS: 800.0000 mg | ORAL_TABLET | Freq: Three times a day (TID) | ORAL | Status: DC | PRN

## 2013-06-28 NOTE — ED Notes (Signed)
Right ankle injury today. Twisted it while walking.

## 2013-06-28 NOTE — Telephone Encounter (Signed)
Patient was not available to talk when called.

## 2013-06-28 NOTE — ED Provider Notes (Signed)
CSN: [id number]     Arrival date & time 06/28/13  1734 History   First MD Initiated Contact with Patient 06/28/13 1834     Chief Complaint  Patient presents with  . Ankle Pain   (Consider location/radiation/quality/duration/timing/severity/associated sxs/prior Treatment) Patient is a 22 y.o. female presenting with ankle pain. The history is provided by the patient. No language interpreter was used.  Ankle Pain Location:  Ankle Time since incident:  1 day Ankle location:  R ankle Pain details:    Quality:  Aching   Radiates to:  Does not radiate   Onset quality:  Gradual   Timing:  Constant Chronicity:  New Dislocation: no   Foreign body present:  No foreign bodies Worsened by:  Nothing tried Ineffective treatments:  None tried Associated symptoms: swelling   Pt reports she has turned the same ankle trice going down stairs.   Pt complains of pain with walking  History reviewed. No pertinent past medical history. History reviewed. No pertinent past surgical history. No family history on file. History  Substance Use Topics  . Smoking status: Current Every Day Smoker  . Smokeless tobacco: Not on file  . Alcohol Use: No   OB History   Grav Para Term Preterm Abortions TAB SAB Ect Mult Living                 Review of Systems  Musculoskeletal: Positive for joint swelling and myalgias.  All other systems reviewed and are negative.    Allergies  Review of patient's allergies indicates no known allergies.  Home Medications   Current Outpatient Rx  Name  Route  Sig  Dispense  Refill  . cyclobenzaprine (FLEXERIL) 5 MG tablet   Oral   Take 1 tablet (5 mg total) by mouth 2 (two) times daily as needed for muscle spasms.   20 tablet   0   . ibuprofen (ADVIL,MOTRIN) 800 MG tablet   Oral   Take 1 tablet (800 mg total) by mouth every 8 (eight) hours as needed for pain.   30 tablet   0   . NAPROXEN PO   Oral   Take by mouth.         . SUMAtriptan (IMITREX) 100 MG  tablet   Oral   Take 1 tablet (100 mg total) by mouth every 2 (two) hours as needed for migraine. Max 200 mg per 24 hour period   10 tablet   0    BP 111/76  Pulse 94  Temp(Src) 98.6 F (37 C) (Oral)  Resp 16  Ht 5' (1.524 m)  Wt 100 lb (45.36 kg)  BMI 19.53 kg/m2  SpO2 100%  LMP 06/24/2013 Physical Exam  Nursing note and vitals reviewed. Constitutional: She is oriented to person, place, and time. She appears well-developed and well-nourished.  HENT:  Head: Normocephalic and atraumatic.  Eyes: Pupils are equal, round, and reactive to light.  Neck: Normal range of motion.  Cardiovascular: Normal rate.   Pulmonary/Chest: Effort normal and breath sounds normal.  Musculoskeletal: She exhibits tenderness.  Swollen tender right ankle,  Decreased range of motion,  nv and ns intact  Neurological: She is alert and oriented to person, place, and time.  Skin: Skin is warm.  Psychiatric: She has a normal mood and affect.    ED Course  Procedures (including critical care time) Labs Review Labs Reviewed - No data to display Imaging Review Dg Ankle Complete Right  06/28/2013   CLINICAL DATA:  Ankle pain after  twisting injury  EXAM: RIGHT ANKLE - COMPLETE 3+ VIEW  COMPARISON:  None.  FINDINGS: No evidence of fracture dislocation. There is fullness in the ankle joint anteriorly, suspicious for an ankle joint effusion. There is no evidence of arthropathy or other focal bone abnormality.  IMPRESSION: Possible ankle joint effusion. No acute bony abnormality identified.   Electronically Signed   By: Britta Mccreedy M.D.   On: 06/28/2013 18:25    EKG Interpretation   None       MDM   1. Ankle sprain, right, initial encounter    ibuprofen    Elson Areas, PA-C 06/28/13 1907

## 2013-06-29 ENCOUNTER — Encounter: Payer: Self-pay | Admitting: Family Medicine

## 2013-06-29 ENCOUNTER — Encounter: Payer: Self-pay | Admitting: General Practice

## 2013-06-29 ENCOUNTER — Ambulatory Visit (INDEPENDENT_AMBULATORY_CARE_PROVIDER_SITE_OTHER): Payer: BC Managed Care – PPO | Admitting: Family Medicine

## 2013-06-29 VITALS — BP 110/76 | HR 66 | Temp 97.9°F | Resp 16 | Ht 60.5 in | Wt 102.2 lb

## 2013-06-29 DIAGNOSIS — R002 Palpitations: Secondary | ICD-10-CM | POA: Insufficient documentation

## 2013-06-29 DIAGNOSIS — E876 Hypokalemia: Secondary | ICD-10-CM | POA: Insufficient documentation

## 2013-06-29 LAB — CBC WITH DIFFERENTIAL/PLATELET
Basophils Relative: 0.7 % (ref 0.0–3.0)
Eosinophils Relative: 1.5 % (ref 0.0–5.0)
HCT: 39.2 % (ref 36.0–46.0)
Hemoglobin: 12.9 g/dL (ref 12.0–15.0)
Lymphs Abs: 1.7 10*3/uL (ref 0.7–4.0)
MCV: 89.2 fl (ref 78.0–100.0)
Monocytes Absolute: 0.8 10*3/uL (ref 0.1–1.0)
Neutro Abs: 5.2 10*3/uL (ref 1.4–7.7)
Neutrophils Relative %: 65.7 % (ref 43.0–77.0)
RBC: 4.39 Mil/uL (ref 3.87–5.11)
WBC: 7.8 10*3/uL (ref 4.5–10.5)

## 2013-06-29 LAB — BASIC METABOLIC PANEL
BUN: 8 mg/dL (ref 6–23)
GFR: 123.23 mL/min (ref >60.00)
Glucose, Bld: 82 mg/dL (ref 70–99)
Potassium: 3.8 mEq/L (ref 3.5–5.1)

## 2013-06-29 NOTE — ED Provider Notes (Signed)
Medical screening examination/treatment/procedure(s) were performed by non-physician practitioner and as supervising physician I was immediately available for consultation/collaboration.  Megan E Docherty, MD 06/29/13 1538 

## 2013-06-29 NOTE — Progress Notes (Signed)
  Subjective:    Patient ID: Tammy Mejia, female    DOB: 12/06/1990, 22 y.o.   MRN: [id number]  HPI New to establish.  No previous PCP.  Last pap 2012.  Palpitations- was told in HS she had low K+.  Describes current sensation as 'heart sink and then come back up'.  Occuring 2-3x/week.  No particular time of day, no relation to food or caffeine.  Started 2-3 weeks ago.  Started while in [place] w/ boyfriend.  Some associated SOB.  Lasts for 'seconds'.  Spontaneously resolves w/ lying or sitting down.  No nausea.  Some dizziness.  No hx of similar.  Pt admits to unprotected sex and wonders if she could be pregnant   Review of Systems For ROS see HPI     Objective:   Physical Exam  Vitals reviewed. Constitutional: She is oriented to person, place, and time. She appears well-developed and well-nourished. No distress.  HENT:  Head: Normocephalic and atraumatic.  Multiple facial piercings  Neck: No thyromegaly present.  Cardiovascular: Normal rate, regular rhythm, normal heart sounds and intact distal pulses.   No murmur heard. Pulmonary/Chest: Effort normal and breath sounds normal. No respiratory distress. She has no wheezes. She has no rales.  Musculoskeletal: She exhibits no edema.  Lymphadenopathy:    She has no cervical adenopathy.  Neurological: She is alert and oriented to person, place, and time. No cranial nerve deficit. Coordination normal.  Skin: Skin is warm and dry. No rash noted. No erythema.  Multiple tattoos  Psychiatric: She has a normal mood and affect. Her behavior is normal.          Assessment & Plan:

## 2013-06-29 NOTE — Telephone Encounter (Signed)
Unable to reach prior to visit  

## 2013-06-29 NOTE — Patient Instructions (Signed)
We'll notify you of your lab results and determine the next steps Make sure you are drinking plenty of water Change positions slowly- allow yourself time to adjust Call with any questions or concerns Welcome!  We're glad to have you!

## 2013-07-01 NOTE — Assessment & Plan Note (Signed)
New.  Asymptomatic in office today.  EKG WNL.  Check labs to r/o anemia, electrolyte abnormalities, thyroid problem.  Encouraged increased water intake, regular eating.  Refer to cards for complete evaluation.

## 2013-07-01 NOTE — Assessment & Plan Note (Signed)
New to provider, pt w/ hx of this.  Check labs to ensure normal electrolytes in setting of palpitations.

## 2013-07-03 ENCOUNTER — Ambulatory Visit: Payer: BC Managed Care – PPO | Admitting: Cardiology

## 2013-07-03 ENCOUNTER — Encounter: Payer: Self-pay | Admitting: General Practice

## 2013-07-24 ENCOUNTER — Encounter: Payer: Self-pay | Admitting: Cardiology

## 2013-07-25 ENCOUNTER — Emergency Department (HOSPITAL_BASED_OUTPATIENT_CLINIC_OR_DEPARTMENT_OTHER)
Admission: EM | Admit: 2013-07-25 | Discharge: 2013-07-25 | Disposition: A | Discharge status: Home / Self Care | Payer: BC Managed Care – PPO | Attending: Emergency Medicine | Admitting: Emergency Medicine

## 2013-07-25 ENCOUNTER — Encounter (HOSPITAL_BASED_OUTPATIENT_CLINIC_OR_DEPARTMENT_OTHER): Payer: Self-pay | Admitting: Emergency Medicine

## 2013-07-25 DIAGNOSIS — N946 Dysmenorrhea, unspecified: Secondary | ICD-10-CM | POA: Insufficient documentation

## 2013-07-25 DIAGNOSIS — Z3202 Encounter for pregnancy test, result negative: Secondary | ICD-10-CM | POA: Insufficient documentation

## 2013-07-25 DIAGNOSIS — Z862 Personal history of diseases of the blood and blood-forming organs and certain disorders involving the immune mechanism: Secondary | ICD-10-CM | POA: Insufficient documentation

## 2013-07-25 DIAGNOSIS — Z8639 Personal history of other endocrine, nutritional and metabolic disease: Secondary | ICD-10-CM | POA: Insufficient documentation

## 2013-07-25 LAB — URINALYSIS, ROUTINE W REFLEX MICROSCOPIC
Glucose, UA: NEGATIVE mg/dL
Hgb urine dipstick: NEGATIVE
Protein, ur: NEGATIVE mg/dL
Specific Gravity, Urine: 1.007 (ref 1.005–1.030)

## 2013-07-25 LAB — PREGNANCY, URINE: Preg Test, Ur: NEGATIVE

## 2013-07-25 MED ORDER — IBUPROFEN 400 MG PO TABS
400.0000 mg | ORAL_TABLET | Freq: Once | ORAL | Status: AC
  Administered 2013-07-25: 400 mg via ORAL
  Filled 2013-07-25: qty 1

## 2013-07-25 MED ORDER — IBUPROFEN 400 MG PO TABS: 400.0000 mg | ORAL_TABLET | Freq: Three times a day (TID) | ORAL | Status: DC | PRN

## 2013-07-25 NOTE — ED Provider Notes (Signed)
CSN: [id number]     Arrival date & time 07/25/13  1331 History   First MD Initiated Contact with Patient 07/25/13 1345     Chief Complaint  Patient presents with  . Pelvic Pain   (Consider location/radiation/quality/duration/timing/severity/associated sxs/prior Treatment) Patient is a 22 y.o. female presenting with pelvic pain. The history is provided by the patient.  Pelvic Pain This is a new problem. The current episode started yesterday. The problem occurs constantly. The problem has not changed since onset.Pertinent negatives include no chest pain, no abdominal pain and no shortness of breath. Nothing aggravates the symptoms. Nothing relieves the symptoms. She has tried acetaminophen for the symptoms. The treatment provided no relief.    Past Medical History  Diagnosis Date  . Deficiency of potassium    History reviewed. No pertinent past surgical history. Family History  Problem Relation Age of Onset  . COPD Mother    History  Substance Use Topics  . Smoking status: Never Smoker   . Smokeless tobacco: Never Used  . Alcohol Use: Yes   OB History   Grav Para Term Preterm Abortions TAB SAB Ect Mult Living                 Review of Systems  Constitutional: Negative for fever.  Respiratory: Negative for cough and shortness of breath.   Cardiovascular: Negative for chest pain and leg swelling.  Gastrointestinal: Negative for nausea, vomiting, abdominal pain and diarrhea.  Genitourinary: Positive for pelvic pain.  All other systems reviewed and are negative.    Allergies  Review of patient's allergies indicates no known allergies.  Home Medications   Current Outpatient Rx  Name  Route  Sig  Dispense  Refill  . cyclobenzaprine (FLEXERIL) 5 MG tablet   Oral   Take 1 tablet (5 mg total) by mouth 2 (two) times daily as needed for muscle spasms.   20 tablet   0   . ibuprofen (ADVIL,MOTRIN) 400 MG tablet   Oral   Take 1 tablet (400 mg total) by mouth every 8 (eight)  hours as needed.   30 tablet   0   . ibuprofen (ADVIL,MOTRIN) 800 MG tablet   Oral   Take 1 tablet (800 mg total) by mouth every 8 (eight) hours as needed for pain.   30 tablet   0   . NAPROXEN PO   Oral   Take by mouth.         . SUMAtriptan (IMITREX) 100 MG tablet   Oral   Take 1 tablet (100 mg total) by mouth every 2 (two) hours as needed for migraine. Max 200 mg per 24 hour period   10 tablet   0    BP 115/79  Pulse 100  Temp(Src) 98 F (36.7 C) (Oral)  Resp 18  Ht 5\' 1"  (1.549 m)  Wt 100 lb (45.36 kg)  BMI 18.90 kg/m2  SpO2 100%  LMP 07/24/2013 Physical Exam  Nursing note and vitals reviewed. Constitutional: She is oriented to person, place, and time. She appears well-developed and well-nourished. No distress.  HENT:  Head: Normocephalic and atraumatic.  Eyes: EOM are normal. Pupils are equal, round, and reactive to light.  Neck: Normal range of motion. Neck supple.  Cardiovascular: Normal rate and regular rhythm.  Exam reveals no friction rub.   No murmur heard. Pulmonary/Chest: Effort normal and breath sounds normal. No respiratory distress. She has no wheezes. She has no rales.  Abdominal: Soft. She exhibits no distension. There is  no tenderness. There is no rebound.  Musculoskeletal: Normal range of motion. She exhibits no edema.  Neurological: She is alert and oriented to person, place, and time. No cranial nerve deficit. She exhibits normal muscle tone. Coordination normal.  Skin: No rash noted. She is not diaphoretic.    ED Course  Procedures (including critical care time) Labs Review Labs Reviewed  URINALYSIS, ROUTINE W REFLEX MICROSCOPIC  PREGNANCY, URINE   Imaging Review No results found.  EKG Interpretation   None       MDM   1. Dysmenorrhea    65F presents with abdominal cramping. On menses now, tylenol not helping with menstraul cramps. No relief with tylenol. No fever, N/V/D, vaginal discharge, urinary symptoms. AFVSS here.  Abdomen benign. Will give motrin for her menstrual cramps. Rx for motrin given.  Normal UA, not pregnant. Stable for discharge.   Dagmar Hait, MD 07/25/13 [phone]

## 2013-07-25 NOTE — ED Notes (Signed)
C/o pelvic pain with menstrual pd-LMP 11/4

## 2013-09-22 ENCOUNTER — Encounter (HOSPITAL_BASED_OUTPATIENT_CLINIC_OR_DEPARTMENT_OTHER): Payer: Self-pay | Admitting: Emergency Medicine

## 2013-09-22 ENCOUNTER — Emergency Department (HOSPITAL_BASED_OUTPATIENT_CLINIC_OR_DEPARTMENT_OTHER)
Admission: EM | Admit: 2013-09-22 | Discharge: 2013-09-22 | Disposition: A | Discharge status: Home / Self Care | Payer: BC Managed Care – PPO | Attending: Emergency Medicine | Admitting: Emergency Medicine

## 2013-09-22 DIAGNOSIS — G4700 Insomnia, unspecified: Secondary | ICD-10-CM | POA: Insufficient documentation

## 2013-09-22 DIAGNOSIS — F17200 Nicotine dependence, unspecified, uncomplicated: Secondary | ICD-10-CM | POA: Insufficient documentation

## 2013-09-22 DIAGNOSIS — Z862 Personal history of diseases of the blood and blood-forming organs and certain disorders involving the immune mechanism: Secondary | ICD-10-CM | POA: Insufficient documentation

## 2013-09-22 DIAGNOSIS — R5381 Other malaise: Secondary | ICD-10-CM | POA: Insufficient documentation

## 2013-09-22 DIAGNOSIS — Z8639 Personal history of other endocrine, nutritional and metabolic disease: Secondary | ICD-10-CM | POA: Insufficient documentation

## 2013-09-22 DIAGNOSIS — R5383 Other fatigue: Secondary | ICD-10-CM

## 2013-09-22 MED ORDER — ZOLPIDEM TARTRATE 5 MG PO TABS: 5.0000 mg | ORAL_TABLET | Freq: Every evening | ORAL | Status: DC | PRN

## 2013-09-22 NOTE — ED Notes (Signed)
Pt having sleeping difficulty for approximately two weeks.

## 2013-09-22 NOTE — Discharge Instructions (Signed)
Please try and control life stressors so you do not have to rely on medications. Go to sleep at the same time, try and read or do something relaxing before bed every day, avoid stimulants (ie. Caffeine). Minimize alcohol.  If you were given medicines take as directed.  If you are on coumadin or contraceptives realize their levels and effectiveness is altered by many different medicines.  If you have any reaction (rash, tongues swelling, other) to the medicines stop taking and see a physician.   Please follow up as directed and return to the ER or see a physician for new or worsening symptoms.  Thank you.  Insomnia Insomnia means you have trouble falling or staying asleep. It affects about one person in three at different times and is usually related to stress from work, school, or personal relations. Insomnia is also a sign of depression or anxiety. Other medical problems that cause insomnia include conditions that cause pain, night leg cramps, coughing, shortness of breath, urinary problems, and fevers. Sleep apnea is an abnormal breathing pattern at night that can cause insomnia and loud snoring. Certain medications and excess intake of caffeine drinks (coffee, tea, colas) can also interfere with normal sleep. Treatment for insomnia depends on the cause. Besides specific medical treatment, the following measures can help you relax and get better sleep. Get regular exercise every day, at least several hours before bed time. Try to get to bed at the same time every night. Take a hot bath before retiring to help you relax. Do not stay in bed if you are unable to sleep. During the daytime avoid staying in bed to watch television, eat, or read. Reduce unwanted noise and light in your room. Keep your room at a comfortable temperature. Avoid alcohol as it causes one to sleep less soundly, may cause you to awaken during the night, and can leave you feeling groggy the next day. Using a mild sedative prescribed or  suggested by your caregiver may be needed, but the daily use of sleeping pills is not recommended. Anti-depressant medicines can improve sleep in people with depression. Please call your doctor for follow up care to better understand the cause and proper treatment of your insomnia. Document Released: 10/14/2004 Document Revised: 11/29/2011 Document Reviewed: 09/06/2005 [facility] Patient Information 2014 [place]Care, [place].

## 2013-09-22 NOTE — ED Provider Notes (Signed)
CSN: [id number]     Arrival date & time 09/22/13  1022 History   First MD Initiated Contact with Patient 09/22/13 1039     Chief Complaint  Patient presents with  . Insomnia   (Consider location/radiation/quality/duration/timing/severity/associated sxs/prior Treatment) HPI Comments: 23 yo female with smoking hx presents with difficulty sleeping for a few weeks despite trying routines and melatonin.  Pt has had life and family stressors, no SI or HI.  No stimulant use.  Nothing improves.  No neuro sxs.  The history is provided by the patient.    Past Medical History  Diagnosis Date  . Deficiency of potassium    No past surgical history on file. Family History  Problem Relation Age of Onset  . COPD Mother    History  Substance Use Topics  . Smoking status: Current Every Day Smoker  . Smokeless tobacco: Never Used  . Alcohol Use: No   OB History   Grav Para Term Preterm Abortions TAB SAB Ect Mult Living                 Review of Systems  Constitutional: Positive for appetite change and fatigue. Negative for fever and chills.  HENT: Negative for congestion.   Cardiovascular: Negative for chest pain.  Gastrointestinal: Negative for vomiting.  Musculoskeletal: Negative for neck pain and neck stiffness.  Skin: Negative for rash.  Neurological: Negative for weakness and headaches.    Allergies  Review of patient's allergies indicates no known allergies.  Home Medications   Current Outpatient Rx  Name  Route  Sig  Dispense  Refill  . zolpidem (AMBIEN) 5 MG tablet   Oral   Take 1 tablet (5 mg total) by mouth at bedtime as needed for sleep.   14 tablet   0    BP 107/71  Pulse 69  Temp(Src) 98.4 F (36.9 C) (Oral)  Ht 5' (1.524 m)  Wt 100 lb (45.36 kg)  BMI 19.53 kg/m2  SpO2 100%  LMP 08/22/2013 Physical Exam  Nursing note and vitals reviewed. Constitutional: She is oriented to person, place, and time. She appears well-developed and well-nourished.  HENT:   Head: Normocephalic and atraumatic.  Eyes: Conjunctivae are normal. Right eye exhibits no discharge. Left eye exhibits no discharge.  Neck: Normal range of motion. Neck supple. No tracheal deviation present.  Cardiovascular: Normal rate and regular rhythm.   Pulmonary/Chest: Effort normal and breath sounds normal.  Musculoskeletal: She exhibits no edema.  Neurological: She is alert and oriented to person, place, and time. No cranial nerve deficit.  Skin: Skin is warm. No rash noted.  Psychiatric: She has a normal mood and affect.    ED Course  Procedures (including critical care time) Labs Review Labs Reviewed - No data to display Imaging Review No results found.  EKG Interpretation   None       MDM   1. Insomnia    Well appearing. Discussed ways to help sleeping habits.  Pt will continue to try and control life stressors. Trial of ambien however discussed with pt that relying on medicines is not the best option in this scenerio.  Results and differential diagnosis were discussed with the patient. Close follow up outpatient was discussed, patient comfortable with the plan.   Diagnosis: sleep disturbance/ insomnia    Enid SkeensJoshua M Wei Poplaski, MD 09/22/13 1136

## 2013-10-09 ENCOUNTER — Encounter: Payer: Self-pay | Admitting: General Practice

## 2013-12-11 ENCOUNTER — Encounter: Payer: Self-pay | Admitting: General Practice

## 2014-03-15 ENCOUNTER — Emergency Department (HOSPITAL_BASED_OUTPATIENT_CLINIC_OR_DEPARTMENT_OTHER)
Admission: EM | Admit: 2014-03-15 | Discharge: 2014-03-15 | Disposition: A | Discharge status: Home / Self Care | Payer: BC Managed Care – PPO | Attending: Emergency Medicine | Admitting: Emergency Medicine

## 2014-03-15 ENCOUNTER — Encounter (HOSPITAL_BASED_OUTPATIENT_CLINIC_OR_DEPARTMENT_OTHER): Payer: Self-pay | Admitting: Emergency Medicine

## 2014-03-15 DIAGNOSIS — R110 Nausea: Secondary | ICD-10-CM | POA: Insufficient documentation

## 2014-03-15 DIAGNOSIS — N898 Other specified noninflammatory disorders of vagina: Secondary | ICD-10-CM | POA: Insufficient documentation

## 2014-03-15 DIAGNOSIS — Z8639 Personal history of other endocrine, nutritional and metabolic disease: Secondary | ICD-10-CM | POA: Insufficient documentation

## 2014-03-15 DIAGNOSIS — Z3202 Encounter for pregnancy test, result negative: Secondary | ICD-10-CM | POA: Insufficient documentation

## 2014-03-15 DIAGNOSIS — R5381 Other malaise: Secondary | ICD-10-CM | POA: Insufficient documentation

## 2014-03-15 DIAGNOSIS — Z862 Personal history of diseases of the blood and blood-forming organs and certain disorders involving the immune mechanism: Secondary | ICD-10-CM | POA: Insufficient documentation

## 2014-03-15 DIAGNOSIS — R5383 Other fatigue: Secondary | ICD-10-CM

## 2014-03-15 DIAGNOSIS — F17200 Nicotine dependence, unspecified, uncomplicated: Secondary | ICD-10-CM | POA: Insufficient documentation

## 2014-03-15 LAB — URINALYSIS, ROUTINE W REFLEX MICROSCOPIC
BILIRUBIN URINE: NEGATIVE
GLUCOSE, UA: NEGATIVE mg/dL
KETONES UR: NEGATIVE mg/dL
Leukocytes, UA: NEGATIVE
Nitrite: NEGATIVE
PH: 6.5 (ref 5.0–8.0)
PROTEIN: NEGATIVE mg/dL
Specific Gravity, Urine: 1.002 — ABNORMAL LOW (ref 1.005–1.030)
Urobilinogen, UA: 0.2 mg/dL (ref 0.0–1.0)

## 2014-03-15 LAB — CBC WITH DIFFERENTIAL/PLATELET
Basophils Absolute: 0.1 10*3/uL (ref 0.0–0.1)
Basophils Relative: 1 % (ref 0–1)
Eosinophils Absolute: 0.1 10*3/uL (ref 0.0–0.7)
Eosinophils Relative: 1 % (ref 0–5)
HCT: 38.8 % (ref 36.0–46.0)
Hemoglobin: 12.4 g/dL (ref 12.0–15.0)
LYMPHS PCT: 25 % (ref 12–46)
Lymphs Abs: 2.2 10*3/uL (ref 0.7–4.0)
MCH: 28 pg (ref 26.0–34.0)
MCHC: 32 g/dL (ref 30.0–36.0)
MCV: 87.6 fL (ref 78.0–100.0)
Monocytes Absolute: 1 10*3/uL (ref 0.1–1.0)
Monocytes Relative: 11 % (ref 3–12)
NEUTROS ABS: 5.6 10*3/uL (ref 1.7–7.7)
Neutrophils Relative %: 62 % (ref 43–77)
PLATELETS: 316 10*3/uL (ref 150–400)
RBC: 4.43 MIL/uL (ref 3.87–5.11)
RDW: 13 % (ref 11.5–15.5)
WBC: 9 10*3/uL (ref 4.0–10.5)

## 2014-03-15 LAB — URINE MICROSCOPIC-ADD ON

## 2014-03-15 LAB — PREGNANCY, URINE: Preg Test, Ur: NEGATIVE

## 2014-03-15 LAB — BASIC METABOLIC PANEL
BUN: 6 mg/dL (ref 6–23)
CALCIUM: 9.6 mg/dL (ref 8.4–10.5)
CHLORIDE: 104 meq/L (ref 96–112)
CO2: 25 meq/L (ref 19–32)
Creatinine, Ser: 0.7 mg/dL (ref 0.50–1.10)
GFR calc Af Amer: >90 mL/min (ref >90)
GFR calc non Af Amer: >90 mL/min (ref >90)
Glucose, Bld: 92 mg/dL (ref 70–99)
Potassium: 4.2 mEq/L (ref 3.7–5.3)
SODIUM: 141 meq/L (ref 137–147)

## 2014-03-15 MED ORDER — PROMETHAZINE HCL 25 MG PO TABS: 25.0000 mg | ORAL_TABLET | Freq: Four times a day (QID) | ORAL | Status: DC | PRN

## 2014-03-15 NOTE — ED Notes (Signed)
Pt c/o nausea but no vomiting or abd pain x2-3 weeks.

## 2014-03-15 NOTE — ED Provider Notes (Signed)
CSN: [id number]     Arrival date & time 03/15/14  1453 History   First MD Initiated Contact with Patient 03/15/14 1503     Chief Complaint  Patient presents with  . Nausea     (Consider location/radiation/quality/duration/timing/severity/associated sxs/prior Treatment) HPI Comments: Pt presents with nausea. She states the nausea is been going on for about 2 weeks. She denies any vomiting. She denies abdominal pain. She says is been intermittent. She has been increasingly fatigued over the last couple weeks as well. She's had irregular periods and currently has some slight spotting. She missed her normal period 2 weeks ago. She denies any vaginal discharge. She denies any urinary symptoms. She denies any cough or cold symptoms. She denies a sore throat. She denies any chest pain or shortness of breath.   Past Medical History  Diagnosis Date  . Deficiency of potassium    History reviewed. No pertinent past surgical history. Family History  Problem Relation Age of Onset  . COPD Mother    History  Substance Use Topics  . Smoking status: Current Every Day Smoker  . Smokeless tobacco: Never Used  . Alcohol Use: No   OB History   Grav Para Term Preterm Abortions TAB SAB Ect Mult Living                 Review of Systems  Constitutional: Positive for fatigue. Negative for fever, chills and diaphoresis.  HENT: Negative for congestion, rhinorrhea and sneezing.   Eyes: Negative.   Respiratory: Negative for cough, chest tightness and shortness of breath.   Cardiovascular: Negative for chest pain and leg swelling.  Gastrointestinal: Positive for nausea. Negative for vomiting, abdominal pain, diarrhea and blood in stool.  Genitourinary: Positive for vaginal bleeding. Negative for frequency, hematuria, flank pain, vaginal discharge, difficulty urinating and vaginal pain.  Musculoskeletal: Negative for arthralgias and back pain.  Skin: Negative for rash.  Neurological: Negative for  dizziness, speech difficulty, weakness, numbness and headaches.      Allergies  Review of patient's allergies indicates no known allergies.  Home Medications   Prior to Admission medications   Medication Sig Start Date End Date Taking? Authorizing Zella Dewan  promethazine (PHENERGAN) 25 MG tablet Take 1 tablet (25 mg total) by mouth every 6 (six) hours as needed for nausea or vomiting. 03/15/14   Rolan BuccoMelanie Belfi, MD  zolpidem (AMBIEN) 5 MG tablet Take 1 tablet (5 mg total) by mouth at bedtime as needed for sleep. 09/22/13   Enid SkeensJoshua M Zavitz, MD   BP 122/87  Pulse 95  Temp(Src) 98.5 F (36.9 C) (Oral)  Resp 16  Ht 5' (1.524 m)  Wt 105 lb (47.628 kg)  BMI 20.51 kg/m2  SpO2 100%  LMP 03/10/2014 Physical Exam  Constitutional: She is oriented to person, place, and time. She appears well-developed and well-nourished.  HENT:  Head: Normocephalic and atraumatic.  Mouth/Throat: Oropharynx is clear and moist.  Eyes: Pupils are equal, round, and reactive to light.  Neck: Normal range of motion. Neck supple.  Cardiovascular: Normal rate, regular rhythm and normal heart sounds.   Pulmonary/Chest: Effort normal and breath sounds normal. No respiratory distress. She has no wheezes. She has no rales. She exhibits no tenderness.  Abdominal: Soft. Bowel sounds are normal. There is no tenderness. There is no rebound and no guarding.  Musculoskeletal: Normal range of motion. She exhibits no edema.  Lymphadenopathy:    She has no cervical adenopathy.  Neurological: She is alert and oriented to person, place, and  time.  Skin: Skin is warm and dry. No rash noted.  Psychiatric: She has a normal mood and affect.    ED Course  Procedures (including critical care time) Labs Review Results for orders placed during the hospital encounter of 03/15/14  URINALYSIS, ROUTINE W REFLEX MICROSCOPIC      Result Value Ref Range   Color, Urine YELLOW  YELLOW   APPearance CLEAR  CLEAR   Specific Gravity, Urine  1.002 (*) 1.005 - 1.030   pH 6.5  5.0 - 8.0   Glucose, UA NEGATIVE  NEGATIVE mg/dL   Hgb urine dipstick SMALL (*) NEGATIVE   Bilirubin Urine NEGATIVE  NEGATIVE   Ketones, ur NEGATIVE  NEGATIVE mg/dL   Protein, ur NEGATIVE  NEGATIVE mg/dL   Urobilinogen, UA 0.2  0.0 - 1.0 mg/dL   Nitrite NEGATIVE  NEGATIVE   Leukocytes, UA NEGATIVE  NEGATIVE  PREGNANCY, URINE      Result Value Ref Range   Preg Test, Ur NEGATIVE  NEGATIVE  URINE MICROSCOPIC-ADD ON      Result Value Ref Range   Squamous Epithelial / LPF RARE  RARE   WBC, UA 0-2  <3 WBC/hpf   RBC / HPF 0-2  <3 RBC/hpf   Bacteria, UA RARE  RARE   Urine-Other MUCOUS PRESENT    CBC WITH DIFFERENTIAL      Result Value Ref Range   WBC 9.0  4.0 - 10.5 K/uL   RBC 4.43  3.87 - 5.11 MIL/uL   Hemoglobin 12.4  12.0 - 15.0 g/dL   HCT 81.138.8  91.436.0 - 78.246.0 %   MCV 87.6  78.0 - 100.0 fL   MCH 28.0  26.0 - 34.0 pg   MCHC 32.0  30.0 - 36.0 g/dL   RDW 95.613.0  21.311.5 - 08.615.5 %   Platelets 316  150 - 400 K/uL   Neutrophils Relative % 62  43 - 77 %   Neutro Abs 5.6  1.7 - 7.7 K/uL   Lymphocytes Relative 25  12 - 46 %   Lymphs Abs 2.2  0.7 - 4.0 K/uL   Monocytes Relative 11  3 - 12 %   Monocytes Absolute 1.0  0.1 - 1.0 K/uL   Eosinophils Relative 1  0 - 5 %   Eosinophils Absolute 0.1  0.0 - 0.7 K/uL   Basophils Relative 1  0 - 1 %   Basophils Absolute 0.1  0.0 - 0.1 K/uL  BASIC METABOLIC PANEL      Result Value Ref Range   Sodium 141  137 - 147 mEq/L   Potassium 4.2  3.7 - 5.3 mEq/L   Chloride 104  96 - 112 mEq/L   CO2 25  19 - 32 mEq/L   Glucose, Bld 92  70 - 99 mg/dL   BUN 6  6 - 23 mg/dL   Creatinine, Ser 5.780.70  0.50 - 1.10 mg/dL   Calcium 9.6  8.4 - 46.910.5 mg/dL   GFR calc non Af Amer >90  >90 mL/min   GFR calc Af Amer >90  >90 mL/min   No results found.    Imaging Review No results found.   EKG Interpretation None      MDM   Final diagnoses:  Nausea    Urine/labs unremarkable.  Not pregnant.  Advised to repeat pregnancy test  in 1-2 weeks.  Return if symptoms worsen.    Rolan BuccoMelanie Belfi, MD 03/15/14 [phone]

## 2014-03-15 NOTE — Discharge Instructions (Signed)

## 2014-04-29 ENCOUNTER — Emergency Department (HOSPITAL_BASED_OUTPATIENT_CLINIC_OR_DEPARTMENT_OTHER)
Admission: EM | Admit: 2014-04-29 | Discharge: 2014-04-29 | Disposition: A | Discharge status: Home / Self Care | Payer: BC Managed Care – PPO | Attending: Emergency Medicine | Admitting: Emergency Medicine

## 2014-04-29 ENCOUNTER — Emergency Department (HOSPITAL_BASED_OUTPATIENT_CLINIC_OR_DEPARTMENT_OTHER): Payer: BC Managed Care – PPO

## 2014-04-29 ENCOUNTER — Encounter (HOSPITAL_BASED_OUTPATIENT_CLINIC_OR_DEPARTMENT_OTHER): Payer: Self-pay | Admitting: Emergency Medicine

## 2014-04-29 DIAGNOSIS — R61 Generalized hyperhidrosis: Secondary | ICD-10-CM | POA: Insufficient documentation

## 2014-04-29 DIAGNOSIS — R0602 Shortness of breath: Secondary | ICD-10-CM | POA: Insufficient documentation

## 2014-04-29 DIAGNOSIS — R079 Chest pain, unspecified: Secondary | ICD-10-CM | POA: Insufficient documentation

## 2014-04-29 DIAGNOSIS — Z79899 Other long term (current) drug therapy: Secondary | ICD-10-CM | POA: Diagnosis not present

## 2014-04-29 DIAGNOSIS — R6883 Chills (without fever): Secondary | ICD-10-CM | POA: Insufficient documentation

## 2014-04-29 DIAGNOSIS — F17200 Nicotine dependence, unspecified, uncomplicated: Secondary | ICD-10-CM | POA: Diagnosis not present

## 2014-04-29 DIAGNOSIS — J069 Acute upper respiratory infection, unspecified: Secondary | ICD-10-CM | POA: Diagnosis not present

## 2014-04-29 DIAGNOSIS — J029 Acute pharyngitis, unspecified: Secondary | ICD-10-CM | POA: Insufficient documentation

## 2014-04-29 DIAGNOSIS — J3489 Other specified disorders of nose and nasal sinuses: Secondary | ICD-10-CM | POA: Diagnosis present

## 2014-04-29 MED ORDER — AZITHROMYCIN 250 MG PO TABS: 250.0000 mg | ORAL_TABLET | Freq: Every day | ORAL | Status: DC

## 2014-04-29 MED ORDER — ALBUTEROL SULFATE HFA 108 (90 BASE) MCG/ACT IN AERS
4.0000 | INHALATION_SPRAY | Freq: Once | RESPIRATORY_TRACT | Status: AC
  Administered 2014-04-29: 4 via RESPIRATORY_TRACT
  Filled 2014-04-29: qty 6.7

## 2014-04-29 NOTE — ED Notes (Signed)
Pt c/o URi symptoms x 2 weeks  

## 2014-04-29 NOTE — Discharge Instructions (Signed)
Upper Respiratory Infection, Adult An upper respiratory infection (URI) is also sometimes known as the common cold. The upper respiratory tract includes the nose, sinuses, throat, trachea, and bronchi. Bronchi are the airways leading to the lungs. Most people improve within 1 week, but symptoms can last up to 2 weeks. A residual cough may last even longer.  CAUSES Many different viruses can infect the tissues lining the upper respiratory tract. The tissues become irritated and inflamed and often become very moist. Mucus production is also common. A cold is contagious. You can easily spread the virus to others by oral contact. This includes kissing, sharing a glass, coughing, or sneezing. Touching your mouth or nose and then touching a surface, which is then touched by another person, can also spread the virus. SYMPTOMS  Symptoms typically develop 1 to 3 days after you come in contact with a cold virus. Symptoms vary from person to person. They may include:  Runny nose.  Sneezing.  Nasal congestion.  Sinus irritation.  Sore throat.  Loss of voice (laryngitis).  Cough.  Fatigue.  Muscle aches.  Loss of appetite.  Headache.  Low-grade fever. DIAGNOSIS  You might diagnose your own cold based on familiar symptoms, since most people get a cold 2 to 3 times a year. Your caregiver can confirm this based on your exam. Most importantly, your caregiver can check that your symptoms are not due to another disease such as strep throat, sinusitis, pneumonia, asthma, or epiglottitis. Blood tests, throat tests, and X-rays are not necessary to diagnose a common cold, but they may sometimes be helpful in excluding other more serious diseases. Your caregiver will decide if any further tests are required. RISKS AND COMPLICATIONS  You may be at risk for a more severe case of the common cold if you smoke cigarettes, have chronic heart disease (such as heart failure) or lung disease (such as asthma), or if  you have a weakened immune system. The very young and very old are also at risk for more serious infections. Bacterial sinusitis, middle ear infections, and bacterial pneumonia can complicate the common cold. The common cold can worsen asthma and chronic obstructive pulmonary disease (COPD). Sometimes, these complications can require emergency medical care and may be life-threatening. PREVENTION  The best way to protect against getting a cold is to practice good hygiene. Avoid oral or hand contact with people with cold symptoms. Wash your hands often if contact occurs. There is no clear evidence that vitamin C, vitamin E, echinacea, or exercise reduces the chance of developing a cold. However, it is always recommended to get plenty of rest and practice good nutrition. TREATMENT  Treatment is directed at relieving symptoms. There is no cure. Antibiotics are not effective, because the infection is caused by a virus, not by bacteria. Treatment may include:  Increased fluid intake. Sports drinks offer valuable electrolytes, sugars, and fluids.  Breathing heated mist or steam (vaporizer or shower).  Eating chicken soup or other clear broths, and maintaining good nutrition.  Getting plenty of rest.  Using gargles or lozenges for comfort.  Controlling fevers with ibuprofen or acetaminophen as directed by your caregiver.  Increasing usage of your inhaler if you have asthma. Zinc gel and zinc lozenges, taken in the first 24 hours of the common cold, can shorten the duration and lessen the severity of symptoms. Pain medicines may help with fever, muscle aches, and throat pain. A variety of non-prescription medicines are available to treat congestion and runny nose. Your caregiver   can make recommendations and may suggest nasal or lung inhalers for other symptoms.  HOME CARE INSTRUCTIONS   Only take over-the-counter or prescription medicines for pain, discomfort, or fever as directed by your  caregiver.  Use a warm mist humidifier or inhale steam from a shower to increase air moisture. This may keep secretions moist and make it easier to breathe.  Drink enough water and fluids to keep your urine clear or pale yellow.  Rest as needed.  Return to work when your temperature has returned to normal or as your caregiver advises. You may need to stay home longer to avoid infecting others. You can also use a face mask and careful hand washing to prevent spread of the virus. SEEK MEDICAL CARE IF:   After the first few days, you feel you are getting worse rather than better.  You need your caregiver's advice about medicines to control symptoms.  You develop chills, worsening shortness of breath, or brown or red sputum. These may be signs of pneumonia.  You develop yellow or brown nasal discharge or pain in the face, especially when you bend forward. These may be signs of sinusitis.  You develop a fever, swollen neck glands, pain with swallowing, or white areas in the back of your throat. These may be signs of strep throat. SEEK IMMEDIATE MEDICAL CARE IF:   You have a fever.  You develop severe or persistent headache, ear pain, sinus pain, or chest pain.  You develop wheezing, a prolonged cough, cough up blood, or have a change in your usual mucus (if you have chronic lung disease).  You develop sore muscles or a stiff neck. Document Released: 03/02/2001 Document Revised: 11/29/2011 Document Reviewed: 12/12/2013 [employer] Patient Information 2015 [employer]. This information is not intended to replace advice given to you by your health care provider. Make sure you discuss any questions you have with your health care provider.  

## 2014-04-29 NOTE — ED Notes (Addendum)
Pt. States that she has been having URI symptoms for 3 weeks. Complains of cough, primarily dry and painful but at times productive with clear thick mucous. Nasal congestion and throat dryness. Pt. States that she is having a difficulty falling asleep and staying asleep and has struggled with insomnia for years. Pt states that she feels SOB on exertion. States that she has been having alternating sweats and chills primarily at work. Denies hx of respiratory problems. States she has had chest pain over the last three weeks, midsternal.

## 2014-04-29 NOTE — ED Provider Notes (Signed)
CSN: [id number]     Arrival date & time 04/29/14  1334 History  This chart was scribed for Candyce Churn III, * by Modena Jansky, ED Scribe. This patient was seen in room MH04/MH04 and the patient's care was started at 3:50 PM.   Chief Complaint  Patient presents with  . URI   Patient is a 23 y.o. female presenting with URI. The history is provided by the patient. No language interpreter was used.  URI Presenting symptoms: congestion, cough and sore throat   Severity:  Moderate Onset quality:  Gradual Duration:  3 weeks Timing:  Intermittent Progression:  Unchanged Chronicity:  New Relieved by:  Nothing Ineffective treatments:  OTC medications  HPI Comments: Tammy Mejia is a 23 y.o. female who presents to the Emergency Department complaining of a intermittent moderate dry cough that has been going on for 3 weeks. She reports that she has been having associated nasal congestion, sore throat, and SOB. She states that she hears a "popping" noise when she takes a deep breath. She states that she has been having chills and sweats. She reports that she is unsure if she has been having any fevers. She states that she took Nyquil without relief. She denies any hx of breathing problems. She also denies any leg swelling, nausea, emesis.   Pt also complains of intermittent substernal chest pain that has been going on for three weeks. She describes the pain as sharp. She reports that the pain has a gradual onset. She states that the pain is exacerbated by taking a deep breat. She denies any hx of heart problems.   Pt also complains of trouble falling and staying asleep.    Past Medical History  Diagnosis Date  . Deficiency of potassium    History reviewed. No pertinent past surgical history. Family History  Problem Relation Age of Onset  . COPD Mother    History  Substance Use Topics  . Smoking status: Current Every Day Smoker -- 0.50 packs/day  . Smokeless tobacco: Never Used   . Alcohol Use: No   OB History   Grav Para Term Preterm Abortions TAB SAB Ect Mult Living                 Review of Systems  Constitutional: Positive for chills and diaphoresis.  HENT: Positive for congestion and sore throat.   Respiratory: Positive for cough and shortness of breath.   Cardiovascular: Positive for chest pain. Negative for leg swelling.  Gastrointestinal: Negative for nausea and vomiting.  Psychiatric/Behavioral: Positive for sleep disturbance.  All other systems reviewed and are negative.   Allergies  Review of patient's allergies indicates no known allergies.  Home Medications   Prior to Admission medications   Medication Sig Start Date End Date Taking? Authorizing Provider  promethazine (PHENERGAN) 25 MG tablet Take 1 tablet (25 mg total) by mouth every 6 (six) hours as needed for nausea or vomiting. 03/15/14   Rolan Bucco, MD  zolpidem (AMBIEN) 5 MG tablet Take 1 tablet (5 mg total) by mouth at bedtime as needed for sleep. 09/22/13   Enid Skeens, MD   BP 107/62  Pulse 100  Temp(Src) 97.8 F (36.6 C) (Oral)  Resp 16  Ht 5' (1.524 m)  Wt 105 lb (47.628 kg)  BMI 20.51 kg/m2  SpO2 99%  LMP 04/06/2014 Physical Exam  Nursing note and vitals reviewed. Constitutional: She is oriented to person, place, and time. She appears well-developed and well-nourished. No distress.  HENT:  Head: Normocephalic and atraumatic.  Mouth/Throat: Oropharynx is clear and moist.  Eyes: Conjunctivae are normal. Pupils are equal, round, and reactive to light. No scleral icterus.  Neck: Neck supple.  Cardiovascular: Normal rate, regular rhythm, normal heart sounds and intact distal pulses.   No murmur heard. Pulmonary/Chest: Effort normal and breath sounds normal. No stridor. No respiratory distress. She has no wheezes (mildly decreased air movement). She has no rales. She exhibits no tenderness.  Abdominal: Soft. Bowel sounds are normal. She exhibits no distension. There is  no tenderness.  Musculoskeletal: Normal range of motion. She exhibits no edema and no tenderness.  Neurological: She is alert and oriented to person, place, and time.  Skin: Skin is warm and dry. No rash noted.  Psychiatric: She has a normal mood and affect. Her behavior is normal.    ED Course  Procedures (including critical care time) DIAGNOSTIC STUDIES: Oxygen Saturation is 99% on RA, normal by my interpretation.    COORDINATION OF CARE: 3:54 PM- Pt advised of plan for treatment which includes medication, and radiology and pt agrees.  Labs Review Labs Reviewed - No data to display  Imaging Review Dg Chest 2 View  04/29/2014   CLINICAL DATA:  Cough and shortness of breath  EXAM: CHEST  2 VIEW  COMPARISON:  12/06/2007  FINDINGS: The cardiomediastinal silhouette is unremarkable.  There is no evidence of focal airspace disease, pulmonary edema, suspicious pulmonary nodule/mass, pleural effusion, or pneumothorax. No acute bony abnormalities are identified.  IMPRESSION: No active cardiopulmonary disease.   Electronically Signed   By: Laveda AbbeJeff  Hu M.D.   On: 04/29/2014 16:15  All radiology studies independently viewed by me.      EKG Interpretation None      MDM   Final diagnoses:  URI (upper respiratory infection)    23 yo female with 3 weeks of URI symptoms.  + sick contacts.  Well appearing, no distress.  PERC negative.  CXR negative. Cough better after albuterol.  O2 sats normal upon ambulation.  Due to time course of URI symptoms, will treat with azithromycin.    I personally performed the services described in this documentation, which was scribed in my presence. The recorded information has been reviewed and is accurate.      Candyce ChurnJohn David Trenita Hulme III, MD 04/29/14 [phone]

## 2014-04-29 NOTE — ED Notes (Signed)
Resting HR 97, RR 18, SpO2 100%.  After walking to x-ray HR 110, RR 18, SpO2 97%.  Pt did have a dry cough. No other complaints noted.

## 2014-06-07 ENCOUNTER — Emergency Department (HOSPITAL_BASED_OUTPATIENT_CLINIC_OR_DEPARTMENT_OTHER)
Admission: EM | Admit: 2014-06-07 | Discharge: 2014-06-07 | Disposition: A | Discharge status: Home / Self Care | Payer: BC Managed Care – PPO | Attending: Emergency Medicine | Admitting: Emergency Medicine

## 2014-06-07 ENCOUNTER — Emergency Department (HOSPITAL_BASED_OUTPATIENT_CLINIC_OR_DEPARTMENT_OTHER): Payer: BC Managed Care – PPO

## 2014-06-07 ENCOUNTER — Encounter (HOSPITAL_BASED_OUTPATIENT_CLINIC_OR_DEPARTMENT_OTHER): Payer: Self-pay | Admitting: Emergency Medicine

## 2014-06-07 DIAGNOSIS — IMO0002 Reserved for concepts with insufficient information to code with codable children: Secondary | ICD-10-CM | POA: Diagnosis not present

## 2014-06-07 DIAGNOSIS — X58XXXA Exposure to other specified factors, initial encounter: Secondary | ICD-10-CM | POA: Diagnosis not present

## 2014-06-07 DIAGNOSIS — M545 Low back pain, unspecified: Secondary | ICD-10-CM

## 2014-06-07 DIAGNOSIS — Z8639 Personal history of other endocrine, nutritional and metabolic disease: Secondary | ICD-10-CM | POA: Insufficient documentation

## 2014-06-07 DIAGNOSIS — Y9341 Activity, dancing: Secondary | ICD-10-CM | POA: Diagnosis not present

## 2014-06-07 DIAGNOSIS — Z792 Long term (current) use of antibiotics: Secondary | ICD-10-CM | POA: Insufficient documentation

## 2014-06-07 DIAGNOSIS — Z862 Personal history of diseases of the blood and blood-forming organs and certain disorders involving the immune mechanism: Secondary | ICD-10-CM | POA: Diagnosis not present

## 2014-06-07 DIAGNOSIS — S40011A Contusion of right shoulder, initial encounter: Secondary | ICD-10-CM

## 2014-06-07 DIAGNOSIS — Y9229 Other specified public building as the place of occurrence of the external cause: Secondary | ICD-10-CM | POA: Diagnosis not present

## 2014-06-07 DIAGNOSIS — Z87891 Personal history of nicotine dependence: Secondary | ICD-10-CM | POA: Diagnosis not present

## 2014-06-07 DIAGNOSIS — S40019A Contusion of unspecified shoulder, initial encounter: Secondary | ICD-10-CM | POA: Diagnosis not present

## 2014-06-07 MED ORDER — OXYCODONE-ACETAMINOPHEN 5-325 MG PO TABS: 1.0000 | ORAL_TABLET | Freq: Four times a day (QID) | ORAL | Status: DC | PRN
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
## 2014-06-07 NOTE — ED Notes (Signed)
MD at bedside. 

## 2014-06-07 NOTE — ED Notes (Signed)
Pt c/o back and bil shoulder pain x 3 days

## 2014-06-20 ENCOUNTER — Ambulatory Visit (INDEPENDENT_AMBULATORY_CARE_PROVIDER_SITE_OTHER): Payer: BC Managed Care – PPO | Admitting: Family Medicine

## 2014-06-20 ENCOUNTER — Encounter: Payer: Self-pay | Admitting: Family Medicine

## 2014-06-20 VITALS — BP 106/78 | HR 73 | Temp 98.2°F | Resp 16 | Wt 106.1 lb

## 2014-06-20 DIAGNOSIS — N946 Dysmenorrhea, unspecified: Secondary | ICD-10-CM

## 2014-06-20 DIAGNOSIS — G4700 Insomnia, unspecified: Secondary | ICD-10-CM

## 2014-06-20 DIAGNOSIS — M25511 Pain in right shoulder: Secondary | ICD-10-CM | POA: Insufficient documentation

## 2014-06-20 MED ORDER — NAPROXEN 500 MG PO TABS: 500.0000 mg | ORAL_TABLET | Freq: Two times a day (BID) | ORAL | Status: DC

## 2014-06-20 MED ORDER — TRAZODONE HCL 50 MG PO TABS: 25.0000 mg | ORAL_TABLET | Freq: Every evening | ORAL | Status: DC | PRN

## 2014-06-20 MED ORDER — NORGESTIMATE-ETH ESTRADIOL 0.25-35 MG-MCG PO TABS: 1.0000 | ORAL_TABLET | Freq: Every day | ORAL | Status: DC

## 2014-06-20 NOTE — Progress Notes (Signed)
   Subjective:    Patient ID: Tammy CarneAshley Borkenhagen, female    DOB: 02/13/1991, 23 y.o.   MRN: [id number]  HPI ER f/u- pt was seen in ER on 9/18 for R shoulder pain.  Pt reports hx of multiple dislocations ('popped out') and fell on shoulder while doing a flip at work.  Xray was negative.  Since then, pain has been worsening.  pt reports she needs a note for indicating that she either can or can't work while taking her oxycodone.  Pt works w/ machines, [employer].  Does a lot of repetitive motion.    Insomnia- pt has hx of this, difficulty both falling asleep and staying asleep.  Pt reports she only sleeps 2-3 hrs.  Has tried OTC sleep aides, melatonin.  Menstrual cramps- 'really bad cramps'.  Occuring monthly.  Will last first 2 days of menses.  'it's to the point i can't walk'.  Pt is interested in starting OCPs.  LMP 9/18.     Review of Systems For ROS see HPI     Objective:   Physical Exam  Vitals reviewed. Constitutional: She is oriented to person, place, and time. She appears well-developed. No distress.  Thin, pale  Abdominal: Soft. Bowel sounds are normal. She exhibits no distension. There is no tenderness. There is no rebound and no guarding.  Musculoskeletal: She exhibits no edema.  + trap spasm on R Pain w/ rotation of R arm, overhead motion  Neurological: She is alert and oriented to person, place, and time. She has normal reflexes. No cranial nerve deficit. Coordination normal.  Skin: Skin is warm and dry. There is pallor.  Psychiatric: She has a normal mood and affect. Her behavior is normal.          Assessment & Plan:

## 2014-06-20 NOTE — Patient Instructions (Signed)
Follow up as needed We'll call you with your orthopedic appt Start the Naproxen twice daily- take w/ food- for inflammation Start the birth control the Sunday after your start your next period Use the Trazodone for sleep Call with any questions or concerns Hang in there!

## 2014-06-20 NOTE — Progress Notes (Signed)
Pre visit review using our clinic review tool, if applicable. No additional management support is needed unless otherwise documented below in the visit note. 

## 2014-06-24 ENCOUNTER — Encounter: Payer: Self-pay | Admitting: General Practice

## 2014-06-24 NOTE — Assessment & Plan Note (Signed)
New.  Pt willing to start OCPs to improve pain associated w/ menses.  Start scheduled NSAIDs.  Reviewed supportive care and red flags that should prompt return.  Pt expressed understanding and is in agreement w/ plan.

## 2014-06-24 NOTE — Assessment & Plan Note (Addendum)
New to provider.  Pt w/ hx of similar- reporting multiple episodes where 'shoulder popped out'.  Due to this and pt's recent injury sustained after fall- will refer to ortho.  Pt to take NSAIDs.  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  Pt expressed understanding and is in agreement w/ plan.

## 2014-06-24 NOTE — Assessment & Plan Note (Signed)
Pt w/ hx of similar.  Asking for prescription sleep aid after failure of OTC products.  Will start trazodone and monitor for improvement.  Pt expressed understanding and is in agreement w/ plan.

## 2014-07-22 ENCOUNTER — Ambulatory Visit: Payer: BC Managed Care – PPO | Attending: Orthopedic Surgery | Admitting: Physical Therapy

## 2014-08-19 ENCOUNTER — Emergency Department (HOSPITAL_BASED_OUTPATIENT_CLINIC_OR_DEPARTMENT_OTHER)
Admission: EM | Admit: 2014-08-19 | Discharge: 2014-08-19 | Disposition: A | Discharge status: Home / Self Care | Payer: 59 | Attending: Emergency Medicine | Admitting: Emergency Medicine

## 2014-08-19 ENCOUNTER — Encounter (HOSPITAL_BASED_OUTPATIENT_CLINIC_OR_DEPARTMENT_OTHER): Payer: Self-pay | Admitting: *Deleted

## 2014-08-19 DIAGNOSIS — Z87891 Personal history of nicotine dependence: Secondary | ICD-10-CM | POA: Diagnosis not present

## 2014-08-19 DIAGNOSIS — Z791 Long term (current) use of non-steroidal anti-inflammatories (NSAID): Secondary | ICD-10-CM | POA: Diagnosis not present

## 2014-08-19 DIAGNOSIS — G43409 Hemiplegic migraine, not intractable, without status migrainosus: Secondary | ICD-10-CM

## 2014-08-19 DIAGNOSIS — Z79899 Other long term (current) drug therapy: Secondary | ICD-10-CM | POA: Diagnosis not present

## 2014-08-19 DIAGNOSIS — G43909 Migraine, unspecified, not intractable, without status migrainosus: Secondary | ICD-10-CM | POA: Diagnosis present

## 2014-08-19 HISTORY — DX: Migraine, unspecified, not intractable, without status migrainosus: G43.909

## 2014-08-19 MED ORDER — KETOROLAC TROMETHAMINE 60 MG/2ML IM SOLN
60.0000 mg | Freq: Once | INTRAMUSCULAR | Status: AC
  Administered 2014-08-19: 60 mg via INTRAMUSCULAR
  Filled 2014-08-19: qty 2

## 2014-08-19 MED ORDER — METOCLOPRAMIDE HCL 5 MG/ML IJ SOLN
10.0000 mg | Freq: Once | INTRAMUSCULAR | Status: AC
  Administered 2014-08-19: 10 mg via INTRAMUSCULAR
  Filled 2014-08-19: qty 2

## 2014-08-19 MED ORDER — DIPHENHYDRAMINE HCL 50 MG/ML IJ SOLN
25.0000 mg | Freq: Once | INTRAMUSCULAR | Status: AC
  Administered 2014-08-19: 25 mg via INTRAMUSCULAR
  Filled 2014-08-19: qty 1

## 2014-08-19 NOTE — ED Provider Notes (Signed)
CSN: [id number]     Arrival date & time 08/19/14  1744 History   First MD Initiated Contact with Patient 08/19/14 2001     Chief Complaint  Patient presents with  . Migraine     (Consider location/radiation/quality/duration/timing/severity/associated sxs/prior Treatment) Patient is a 23 y.o. female presenting with migraines. The history is provided by the patient. No language interpreter was used.  Migraine This is a new problem. The current episode started in the past 7 days. The problem occurs constantly. The problem has been gradually worsening. Associated symptoms include nausea. Pertinent negatives include no vomiting. Nothing aggravates the symptoms. She has tried nothing for the symptoms. The treatment provided no relief.  Pt complains of a migraine headache  Past Medical History  Diagnosis Date  . Deficiency of potassium   . Migraine    History reviewed. No pertinent past surgical history. Family History  Problem Relation Age of Onset  . COPD Mother    History  Substance Use Topics  . Smoking status: Former Smoker -- 0.50 packs/day    Types: Cigarettes  . Smokeless tobacco: Never Used  . Alcohol Use: No   OB History    No data available     Review of Systems  Gastrointestinal: Positive for nausea. Negative for vomiting.  All other systems reviewed and are negative.     Allergies  Review of patient's allergies indicates no known allergies.  Home Medications   Prior to Admission medications   Medication Sig Start Date End Date Taking? Authorizing Provider  naproxen (NAPROSYN) 500 MG tablet Take 1 tablet (500 mg total) by mouth 2 (two) times daily with a meal. 06/20/14 06/20/15 Yes Sheliah HatchKatherine E Tabori, MD  norgestimate-ethinyl estradiol (ORTHO-CYCLEN,SPRINTEC,PREVIFEM) 0.25-35 MG-MCG tablet Take 1 tablet by mouth daily. 06/20/14  Yes Sheliah HatchKatherine E Tabori, MD  traZODone (DESYREL) 50 MG tablet Take 0.5-1 tablets (25-50 mg total) by mouth at bedtime as needed for  sleep. 06/20/14  Yes Sheliah HatchKatherine E Tabori, MD  oxyCODONE-acetaminophen (PERCOCET) 5-325 MG per tablet Take 1 tablet by mouth every 6 (six) hours as needed for moderate pain. 06/07/14   Purvis SheffieldForrest Harrison, MD  zolpidem (AMBIEN) 5 MG tablet Take 1 tablet (5 mg total) by mouth at bedtime as needed for sleep. 09/22/13   Enid SkeensJoshua M Zavitz, MD   BP 104/52 mmHg  Pulse 92  Temp(Src) 98 F (36.7 C) (Oral)  Resp 18  Ht 5' (1.524 m)  Wt 105 lb (47.628 kg)  BMI 20.51 kg/m2  SpO2 100%  LMP 08/07/2014 Physical Exam  Constitutional: She is oriented to person, place, and time. She appears well-developed and well-nourished.  HENT:  Head: Normocephalic.  Right Ear: External ear normal.  Left Ear: External ear normal.  Nose: Nose normal.  Mouth/Throat: Oropharynx is clear and moist.  Eyes: Conjunctivae and EOM are normal. Pupils are equal, round, and reactive to light.  Neck: Normal range of motion.  Pulmonary/Chest: Effort normal.  Abdominal: Soft. She exhibits no distension.  Musculoskeletal: Normal range of motion.  Neurological: She is alert and oriented to person, place, and time.  Skin: Skin is warm.  Psychiatric: She has a normal mood and affect.  Nursing note and vitals reviewed.   ED Course  Procedures (including critical care time) Labs Review Labs Reviewed - No data to display  Imaging Review No results found.   EKG Interpretation None      MDM Pt given torodol, reglan and benadryl IM.    Pt reports relief to RN and wants to  leave.   Pt advised follow up with her MD for recheck   Final diagnoses:  Hemiplegic migraine without status migrainosus, not intractable        Elson AreasLeslie K Cissy Galbreath, PA-C 08/19/14 2133  Mirian MoMatthew Gentry, MD 08/25/14 [phone]

## 2014-08-19 NOTE — Discharge Instructions (Signed)

## 2014-08-19 NOTE — ED Notes (Signed)
Migraine headache x 1 week- OTC meds not helping- +nausea, denies vomiting

## 2014-08-19 NOTE — ED Notes (Signed)
Adult female with pt loud and angry stating they are ready to leave-when in room pt NAD-loud ceel phone music, tv and game electronics in room

## 2014-08-23 ENCOUNTER — Encounter: Payer: Self-pay | Admitting: Medical

## 2014-08-23 ENCOUNTER — Ambulatory Visit (INDEPENDENT_AMBULATORY_CARE_PROVIDER_SITE_OTHER): Payer: 59 | Admitting: Medical

## 2014-08-23 VITALS — BP 98/61 | HR 60 | Temp 98.1°F | Ht 60.5 in | Wt 105.4 lb

## 2014-08-23 DIAGNOSIS — G43009 Migraine without aura, not intractable, without status migrainosus: Secondary | ICD-10-CM

## 2014-08-23 DIAGNOSIS — G43909 Migraine, unspecified, not intractable, without status migrainosus: Secondary | ICD-10-CM | POA: Insufficient documentation

## 2014-08-23 MED ORDER — SUMATRIPTAN SUCCINATE 50 MG PO TABS
50.0000 mg | ORAL_TABLET | ORAL | Status: DC | PRN
Start: 2014-08-23 — End: 2014-08-23

## 2014-08-23 MED ORDER — SUMATRIPTAN SUCCINATE 50 MG PO TABS
50.0000 mg | ORAL_TABLET | ORAL | Status: DC | PRN
Start: 2014-08-23 — End: 2015-02-10

## 2014-08-23 NOTE — Assessment & Plan Note (Signed)
You have apparent migraines recently more severe than in the past. We gave you toradol im today. I am prescribing imitrex to take early for early onset migraine.   If your ha does not respond to imitrex then be seen here or ED.  For severe HA with neurologic signs and symptoms recommend ED visit.  Follow up in 3 wks or as needed. If your HA does not respond to imitrex then would likely get CT of head without contrast.

## 2014-08-23 NOTE — Patient Instructions (Addendum)
You have apparent migraines recently more severe than in the past. We gave you toradol im today. I am prescribing imitrex to take early for early onset migraine.   If your ha does not respond to imitrex then be seen here or ED.  For severe HA with neurologic signs and symptoms recommend ED visit.  Follow up in 3 wks or as needed. If your HA does not respond to imitrex then would likely get CT of head without contrast.  Note regarding pt flank pain and reported mechanism of injury, I was going to advise her to use ibuprofen and see if subsides. I though this was reasonable based on her reported probable mechanism of injury and absence of other suspicious signs or symptoms. Also on exam she did not appear to be in pain. Smiling and pleasant on exam.  I was planning to only advise her on on differential signs and symptoms to watch for that would necessitate work up. The ha were more of a concern and I got distracted regarding her rt flank pain complaints.  I called number in our chart and no voice mail box available. I called number our pharmacy has downstairs and not working number. I called [facility] which she also uses and that number did get voicemail of other person and left message for ashely [facility] to speak with me. No details of her condition given on message. Will see how she is on follow up.

## 2014-08-23 NOTE — Progress Notes (Signed)
Subjective:    Patient ID: Tammy CarneAshley Borkenhagen, female    DOB: 03/02/1991, 23 y.o.   MRN: [id number]  HPI   Pt states today this am some rt side abdomen that was present very early in am. Pt states pain was worse before in early am. No less. Intensity of this pain comes and goes. Pain level know at level faint at best. No back pain. No nausea or vomiting. No pain when she urinates. Normal appetite/no change. 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 She is not sure. No fever or chills. Side pain is new about 3-4 days.   Pt has history of migraines. She gets ha around 1-2 times a month. Pt states from Monday before thanksgiving had migraine that last 1 wk. Pt this Monday finally went to ED got treatment. She got 3 injection and pain did subside. All day this Tuesday no ha. Then wed at noon her ha returned. Pain level about 4. Still present now at same level.  Pt states typically she gets light and sound sentitive with HA. Some nausea. Her ha usually last 3-4 hours. Ha that last a week was unusual.  LMP- November 16 th.  HA for about 3-4 years.  Pt usually just takes advil, and ibuprofen and then sleeps ha off.  HA is usually on the rt side.  Past Medical History  Diagnosis Date  . Deficiency of potassium   . Migraine     History   Social History  . Marital Status: Single    Spouse Name: N/A    Number of Children: N/A  . Years of Education: N/A   Occupational History  . Not on file.   Social History Main Topics  . Smoking status: Former Smoker -- 0.50 packs/day    Types: Cigarettes  . Smokeless tobacco: Never Used  . Alcohol Use: No  . Drug Use: No  . Sexual Activity: Yes    Birth Control/ Protection: Pill   Other Topics Concern  . Not on file   Social History Narrative    No past surgical history on file.  Family History  Problem Relation Age of Onset  . COPD Mother     No Known Allergies  Current Outpatient  Prescriptions on File Prior to Visit  Medication Sig Dispense Refill  . naproxen (NAPROSYN) 500 MG tablet Take 1 tablet (500 mg total) by mouth 2 (two) times daily with a meal. 60 tablet 0  . norgestimate-ethinyl estradiol (ORTHO-CYCLEN,SPRINTEC,PREVIFEM) 0.25-35 MG-MCG tablet Take 1 tablet by mouth daily. 1 Package 11  . traZODone (DESYREL) 50 MG tablet Take 0.5-1 tablets (25-50 mg total) by mouth at bedtime as needed for sleep. 30 tablet 3   No current facility-administered medications on file prior to visit.    BP 98/61 mmHg  Pulse 60  Temp(Src) 98.1 F (36.7 C) (Oral)  Ht 5' 0.5" (1.537 m)  Wt 105 lb 6.4 oz (47.809 kg)  BMI 20.24 kg/m2  SpO2 98%  LMP 08/07/2014    Review of Systems  Constitutional: Negative for fever, chills, diaphoresis, activity change and fatigue.  Respiratory: Negative for cough, chest tightness and shortness of breath.   Cardiovascular: Negative for chest pain, palpitations and leg swelling.  Gastrointestinal: Negative for nausea, vomiting and abdominal pain.  Musculoskeletal: Negative for neck pain and neck stiffness.       Rt flank plain may have hit area dancing.   Neurological: Positive for headaches. Negative for dizziness, tremors,  seizures, syncope, facial asymmetry, speech difficulty, weakness, light-headedness and numbness.       See hpi  Psychiatric/Behavioral: Negative for behavioral problems, confusion and agitation. The patient is not nervous/anxious.        Objective:   Physical Exam   General Mental Status- Alert. General Appearance- Not in acute distress.   Skin General: Color- Normal Color. Moisture- Normal Moisture.  Neck Carotid Arteries- Normal color. Moisture- Normal Moisture. No carotid bruits. No JVD.  Chest and Lung Exam Auscultation: Breath Sounds:-Normal.  Cardiovascular Auscultation:Rythm- Regular. Murmurs & Other Heart Sounds:Auscultation of the heart reveals- No Murmurs.  Abdomen Inspection:-Inspeection  Normal. Palpation/Percussion:Note:No mass. Palpation and Percussion of the abdomen reveal- Non Tender, Non Distended + BS, no rebound or guarding.    Neurologic Cranial Nerve exam:- CN III-XII intact(No nystagmus), symmetric smile. Drift Test:- No drift. Romberg Exam:- Negative.  Heal to Toe Gait exam:-Normal. Finger to Nose:- Normal/Intact Strength:- 5/5 equal and symmetric strength both upper and lower extremities.          Assessment & Plan:

## 2014-08-23 NOTE — Progress Notes (Signed)
Pre visit review using our clinic review tool, if applicable. No additional management support is needed unless otherwise documented below in the visit note. 

## 2014-08-28 ENCOUNTER — Encounter (HOSPITAL_BASED_OUTPATIENT_CLINIC_OR_DEPARTMENT_OTHER): Payer: Self-pay

## 2014-08-28 ENCOUNTER — Emergency Department (HOSPITAL_BASED_OUTPATIENT_CLINIC_OR_DEPARTMENT_OTHER)
Admission: EM | Admit: 2014-08-28 | Discharge: 2014-08-28 | Disposition: A | Discharge status: Home / Self Care | Payer: 59 | Attending: Emergency Medicine | Admitting: Emergency Medicine

## 2014-08-28 ENCOUNTER — Emergency Department (HOSPITAL_BASED_OUTPATIENT_CLINIC_OR_DEPARTMENT_OTHER): Payer: 59

## 2014-08-28 DIAGNOSIS — R0602 Shortness of breath: Secondary | ICD-10-CM | POA: Diagnosis not present

## 2014-08-28 DIAGNOSIS — Z720 Tobacco use: Secondary | ICD-10-CM | POA: Insufficient documentation

## 2014-08-28 DIAGNOSIS — Z792 Long term (current) use of antibiotics: Secondary | ICD-10-CM | POA: Diagnosis not present

## 2014-08-28 DIAGNOSIS — N390 Urinary tract infection, site not specified: Secondary | ICD-10-CM | POA: Insufficient documentation

## 2014-08-28 DIAGNOSIS — Z791 Long term (current) use of non-steroidal anti-inflammatories (NSAID): Secondary | ICD-10-CM | POA: Insufficient documentation

## 2014-08-28 DIAGNOSIS — G43909 Migraine, unspecified, not intractable, without status migrainosus: Secondary | ICD-10-CM | POA: Insufficient documentation

## 2014-08-28 DIAGNOSIS — R251 Tremor, unspecified: Secondary | ICD-10-CM | POA: Diagnosis not present

## 2014-08-28 DIAGNOSIS — R079 Chest pain, unspecified: Secondary | ICD-10-CM | POA: Insufficient documentation

## 2014-08-28 DIAGNOSIS — Z3202 Encounter for pregnancy test, result negative: Secondary | ICD-10-CM | POA: Insufficient documentation

## 2014-08-28 DIAGNOSIS — Z8639 Personal history of other endocrine, nutritional and metabolic disease: Secondary | ICD-10-CM | POA: Insufficient documentation

## 2014-08-28 DIAGNOSIS — Z79899 Other long term (current) drug therapy: Secondary | ICD-10-CM | POA: Diagnosis not present

## 2014-08-28 DIAGNOSIS — R0681 Apnea, not elsewhere classified: Secondary | ICD-10-CM | POA: Insufficient documentation

## 2014-08-28 HISTORY — DX: Hypokalemia: E87.6

## 2014-08-28 LAB — CBC WITH DIFFERENTIAL/PLATELET
BASOS ABS: 0.1 10*3/uL (ref 0.0–0.1)
Basophils Relative: 0 % (ref 0–1)
Eosinophils Absolute: 0.1 10*3/uL (ref 0.0–0.7)
Eosinophils Relative: 1 % (ref 0–5)
HEMATOCRIT: 34 % — AB (ref 36.0–46.0)
HEMOGLOBIN: 10.5 g/dL — AB (ref 12.0–15.0)
Lymphocytes Relative: 13 % (ref 12–46)
Lymphs Abs: 1.9 10*3/uL (ref 0.7–4.0)
MCH: 24.9 pg — ABNORMAL LOW (ref 26.0–34.0)
MCHC: 30.9 g/dL (ref 30.0–36.0)
MCV: 80.6 fL (ref 78.0–100.0)
MONO ABS: 1.1 10*3/uL — AB (ref 0.1–1.0)
Monocytes Relative: 7 % (ref 3–12)
NEUTROS ABS: 12.1 10*3/uL — AB (ref 1.7–7.7)
Neutrophils Relative %: 79 % — ABNORMAL HIGH (ref 43–77)
Platelets: 313 10*3/uL (ref 150–400)
RBC: 4.22 MIL/uL (ref 3.87–5.11)
RDW: 14.4 % (ref 11.5–15.5)
WBC: 15.3 10*3/uL — AB (ref 4.0–10.5)

## 2014-08-28 LAB — URINE MICROSCOPIC-ADD ON

## 2014-08-28 LAB — COMPREHENSIVE METABOLIC PANEL
ALT: 8 U/L (ref 0–35)
AST: 17 U/L (ref 0–37)
Albumin: 3.4 g/dL — ABNORMAL LOW (ref 3.5–5.2)
Alkaline Phosphatase: 62 U/L (ref 39–117)
Anion gap: 12 (ref 5–15)
BILIRUBIN TOTAL: 0.3 mg/dL (ref 0.3–1.2)
BUN: 7 mg/dL (ref 6–23)
CHLORIDE: 105 meq/L (ref 96–112)
CO2: 23 meq/L (ref 19–32)
Calcium: 8.4 mg/dL (ref 8.4–10.5)
Creatinine, Ser: 0.8 mg/dL (ref 0.50–1.10)
GFR calc Af Amer: >90 mL/min (ref >90)
Glucose, Bld: 105 mg/dL — ABNORMAL HIGH (ref 70–99)
Potassium: 3.8 mEq/L (ref 3.7–5.3)
Sodium: 140 mEq/L (ref 137–147)
Total Protein: 7.1 g/dL (ref 6.0–8.3)

## 2014-08-28 LAB — URINALYSIS, ROUTINE W REFLEX MICROSCOPIC
BILIRUBIN URINE: NEGATIVE
GLUCOSE, UA: NEGATIVE mg/dL
HGB URINE DIPSTICK: NEGATIVE
KETONES UR: NEGATIVE mg/dL
Nitrite: NEGATIVE
Protein, ur: NEGATIVE mg/dL
Specific Gravity, Urine: 1.011 (ref 1.005–1.030)
Urobilinogen, UA: 1 mg/dL (ref 0.0–1.0)
pH: 7.5 (ref 5.0–8.0)

## 2014-08-28 LAB — PREGNANCY, URINE: Preg Test, Ur: NEGATIVE

## 2014-08-28 LAB — D-DIMER, QUANTITATIVE (NOT AT ARMC): D DIMER QUANT: 0.32 ug{FEU}/mL (ref 0.00–0.48)

## 2014-08-28 MED ORDER — LORAZEPAM 1 MG PO TABS
1.0000 mg | ORAL_TABLET | Freq: Once | ORAL | Status: AC
  Administered 2014-08-28: 1 mg via ORAL
  Filled 2014-08-28: qty 1

## 2014-08-28 MED ORDER — LORAZEPAM 1 MG PO TABS: 1.0000 mg | ORAL_TABLET | Freq: Three times a day (TID) | ORAL | Status: DC | PRN

## 2014-08-28 MED ORDER — NAPROXEN 500 MG PO TABS: 500.0000 mg | ORAL_TABLET | Freq: Two times a day (BID) | ORAL | Status: DC

## 2014-08-28 MED ORDER — IBUPROFEN 800 MG PO TABS
800.0000 mg | ORAL_TABLET | Freq: Once | ORAL | Status: AC
  Administered 2014-08-28: 800 mg via ORAL
  Filled 2014-08-28: qty 1

## 2014-08-28 MED ORDER — CEPHALEXIN 500 MG PO CAPS: 500.0000 mg | ORAL_CAPSULE | Freq: Two times a day (BID) | ORAL | Status: DC

## 2014-08-28 NOTE — ED Notes (Signed)
Pt NAD-speaking in full sentences

## 2014-08-28 NOTE — Discharge Instructions (Signed)
Please follow up with your primary care physician in 1-2 days. If you do not have one please [facility] listed above. Please take your antibiotic until completion. Please read all discharge instructions and return precautions.   Urinary Tract Infection Urinary tract infections (UTIs) can develop anywhere along your urinary tract. Your urinary tract is your body's drainage system for removing wastes and extra water. Your urinary tract includes two kidneys, two ureters, a bladder, and a urethra. Your kidneys are a pair of bean-shaped organs. Each kidney is about the size of your fist. They are located below your ribs, one on each side of your spine. CAUSES Infections are caused by microbes, which are microscopic organisms, including fungi, viruses, and bacteria. These organisms are so small that they can only be seen through a microscope. Bacteria are the microbes that most commonly cause UTIs. SYMPTOMS  Symptoms of UTIs may vary by age and gender of the patient and by the location of the infection. Symptoms in young women typically include a frequent and intense urge to urinate and a painful, burning feeling in the bladder or urethra during urination. Older women and men are more likely to be tired, shaky, and weak and have muscle aches and abdominal pain. A fever may mean the infection is in your kidneys. Other symptoms of a kidney infection include pain in your back or sides below the ribs, nausea, and vomiting. DIAGNOSIS To diagnose a UTI, your caregiver will ask you about your symptoms. Your caregiver also will ask to provide a urine sample. The urine sample will be tested for bacteria and white blood cells. White blood cells are made by your body to help fight infection. TREATMENT  Typically, UTIs can be treated with medication. Because most UTIs are caused by a bacterial infection, they usually can be treated with the use of antibiotics. The choice of antibiotic  and length of treatment depend on your symptoms and the type of bacteria causing your infection. HOME CARE INSTRUCTIONS  If you were prescribed antibiotics, take them exactly as your caregiver instructs you. Finish the medication even if you feel better after you have only taken some of the medication.  Drink enough water and fluids to keep your urine clear or pale yellow.  Avoid caffeine, tea, and carbonated beverages. They tend to irritate your bladder.  Empty your bladder often. Avoid holding urine for long periods of time.  Empty your bladder before and after sexual intercourse.  After a bowel movement, women should cleanse from front to back. Use each tissue only once. SEEK MEDICAL CARE IF:   You have back pain.  You develop a fever.  Your symptoms do not begin to resolve within 3 days. SEEK IMMEDIATE MEDICAL CARE IF:   You have severe back pain or lower abdominal pain.  You develop chills.  You have nausea or vomiting.  You have continued burning or discomfort with urination. MAKE SURE YOU:   Understand these instructions.  Will watch your condition.  Will get help right away if you are not doing well or get worse. Document Released: 06/16/2005 Document Revised: 03/07/2012 Document Reviewed: 10/15/2011 [facility] Patient Information 2015 [place], [place]. This information is not intended to replace advice given to you by your health care provider. Make sure you discuss any questions you have with your health care provider.  Shortness of Breath Shortness of breath means you have trouble breathing. It could also mean that you have a medical problem. You  should get immediate medical care for shortness of breath. CAUSES   Not enough oxygen in the air such as with high altitudes or a smoke-filled room.  Certain lung diseases, infections, or problems.  Heart disease or conditions, such as angina or heart failure.  Low red blood cells (anemia).  Poor physical fitness,  which can cause shortness of breath when you exercise.  Chest or back injuries or stiffness.  Being overweight.  Smoking.  Anxiety, which can make you feel like you are not getting enough air. DIAGNOSIS  Serious medical problems can often be found during your physical exam. Tests may also be done to determine why you are having shortness of breath. Tests may include:  Chest X-rays.  Lung function tests.  Blood tests.  An electrocardiogram (ECG).  An ambulatory electrocardiogram. An ambulatory ECG records your heartbeat patterns over a 24-hour period.  Exercise testing.  A transthoracic echocardiogram (TTE). During echocardiography, sound waves are used to evaluate how blood flows through your heart.  A transesophageal echocardiogram (TEE).  Imaging scans. Your health care provider may not be able to find a cause for your shortness of breath after your exam. In this case, it is important to have a follow-up exam with your health care provider as directed.  TREATMENT  Treatment for shortness of breath depends on the cause of your symptoms and can vary greatly. HOME CARE INSTRUCTIONS   Do not smoke. Smoking is a common cause of shortness of breath. If you smoke, ask for help to quit.  Avoid being around chemicals or things that may bother your breathing, such as paint fumes and dust.  Rest as needed. Slowly resume your usual activities.  If medicines were prescribed, take them as directed for the full length of time directed. This includes oxygen and any inhaled medicines.  Keep all follow-up appointments as directed by your health care provider. SEEK MEDICAL CARE IF:   Your condition does not improve in the time expected.  You have a hard time doing your normal activities even with rest.  You have any new symptoms. SEEK IMMEDIATE MEDICAL CARE IF:   Your shortness of breath gets worse.  You feel light-headed, faint, or develop a cough not controlled with  medicines.  You start coughing up blood.  You have pain with breathing.  You have chest pain or pain in your arms, shoulders, or abdomen.  You have a fever.  You are unable to walk up stairs or exercise the way you normally do. MAKE SURE YOU:  Understand these instructions.  Will watch your condition.  Will get help right away if you are not doing well or get worse. Document Released: 06/01/2001 Document Revised: 09/11/2013 Document Reviewed: 11/22/2011 [facility] Patient Information 2015 [place], [place]. This information is not intended to replace advice given to you by your health care provider. Make sure you discuss any questions you have with your health care provider.

## 2014-08-28 NOTE — ED Notes (Signed)
PA at bedside.

## 2014-08-28 NOTE — ED Notes (Signed)
C/o "shaking abd sob" x this am-NAD

## 2014-08-28 NOTE — ED Notes (Signed)
Pt now states she had abd pain 3 days ago-CP yesterday

## 2014-08-28 NOTE — ED Provider Notes (Signed)
CSN: [id number]     Arrival date & time 08/28/14  1354 History   First MD Initiated Contact with Patient 08/28/14 1409     Chief Complaint  Patient presents with  . Shortness of Breath     (Consider location/radiation/quality/duration/timing/severity/associated sxs/prior Treatment) HPI Comments: Patient is a 23 year old female past medical history significant for migraines presenting to the emergency department for multiple complaints. Patient states she has had 3 days of gradually worsening right-sided abdominal and chest pain, "shaking", shortness of breath. No alleviating or aggravating factors identified. Denies any history of similar symptoms. Patient is on OCPs. Denies any alcohol or recreational drug use. Last menstrual period was 08/07/2014. No history of surgeries.   Past Medical History  Diagnosis Date  . Deficiency of potassium   . Migraine   . Hypokalemia    History reviewed. No pertinent past surgical history. Family History  Problem Relation Age of Onset  . COPD Mother    History  Substance Use Topics  . Smoking status: Current Some Day Smoker -- 0.00 packs/day  . Smokeless tobacco: Never Used  . Alcohol Use: No   OB History    No data available     Review of Systems  Respiratory: Positive for apnea and shortness of breath.   Cardiovascular: Positive for chest pain. Negative for leg swelling.  Gastrointestinal: Positive for abdominal pain. Negative for nausea and vomiting.  Neurological: Positive for tremors. Negative for syncope and headaches.  All other systems reviewed and are negative.     Allergies  Review of patient's allergies indicates no known allergies.  Home Medications   Prior to Admission medications   Medication Sig Start Date End Date Taking? Authorizing Provider  cephALEXin (KEFLEX) 500 MG capsule Take 1 capsule (500 mg total) by mouth 2 (two) times daily. 08/28/14   Marlin Jarrard L Briane Birden, PA-C  LORazepam (ATIVAN) 1 MG tablet Take 1  tablet (1 mg total) by mouth 3 (three) times daily as needed for anxiety (Shakiness). 08/28/14   Ronan Dion L Noreen Mackintosh, PA-C  naproxen (NAPROSYN) 500 MG tablet Take 1 tablet (500 mg total) by mouth 2 (two) times daily with a meal. 08/28/14   Cardarius Senat L Oluwadarasimi Redmon, PA-C  norgestimate-ethinyl estradiol (ORTHO-CYCLEN,SPRINTEC,PREVIFEM) 0.25-35 MG-MCG tablet Take 1 tablet by mouth daily. 06/20/14   Sheliah HatchKatherine E Tabori, MD  SUMAtriptan (IMITREX) 50 MG tablet Take 1 tablet (50 mg total) by mouth every 2 (two) hours as needed for migraine or headache. May repeat in 2 hours if headache persists or recurs. Max number of tabs per 24 hrs is 2. 08/23/14   Bayard BeaverEdward M Saguier, PA-C  traZODone (DESYREL) 50 MG tablet Take 0.5-1 tablets (25-50 mg total) by mouth at bedtime as needed for sleep. 06/20/14   Sheliah HatchKatherine E Tabori, MD   BP 101/65 mmHg  Pulse 84  Temp(Src) 98.4 F (36.9 C) (Oral)  Resp 16  Ht 5' (1.524 m)  Wt 105 lb 5 oz (47.769 kg)  BMI 20.57 kg/m2  SpO2 100%  LMP 08/07/2014 Physical Exam  Constitutional: She is oriented to person, place, and time. She appears well-developed and well-nourished. No distress.  HENT:  Head: Normocephalic and atraumatic.  Right Ear: External ear normal.  Left Ear: External ear normal.  Nose: Nose normal.  Mouth/Throat: Oropharynx is clear and moist.  Eyes: Conjunctivae are normal.  Neck: Normal range of motion. Neck supple.  Cardiovascular: Normal rate, regular rhythm, normal heart sounds and intact distal pulses.   Pulmonary/Chest: Effort normal and breath sounds normal. No respiratory  distress. She exhibits no tenderness.  Abdominal: Soft. Bowel sounds are normal. She exhibits no distension. There is no tenderness. There is no rigidity, no rebound, no guarding and no CVA tenderness.  Musculoskeletal: Normal range of motion. She exhibits no edema.  Neurological: She is alert and oriented to person, place, and time. She has normal strength. No cranial nerve deficit. GCS  eye subscore is 4. GCS verbal subscore is 5. GCS motor subscore is 6.  Bilateral upper extremity tremor  Skin: Skin is warm and dry. No rash noted. She is not diaphoretic.  Psychiatric: She has a normal mood and affect.  Nursing note and vitals reviewed.   ED Course  Procedures (including critical care time) Medications  LORazepam (ATIVAN) tablet 1 mg (1 mg Oral Given 08/28/14 1434)  ibuprofen (ADVIL,MOTRIN) tablet 800 mg (800 mg Oral Given 08/28/14 1434)    Labs Review Labs Reviewed  CBC WITH DIFFERENTIAL - Abnormal; Notable for the following:    WBC 15.3 (*)    Hemoglobin 10.5 (*)    HCT 34.0 (*)    MCH 24.9 (*)    Neutrophils Relative % 79 (*)    Neutro Abs 12.1 (*)    Monocytes Absolute 1.1 (*)    All other components within normal limits  COMPREHENSIVE METABOLIC PANEL - Abnormal; Notable for the following:    Glucose, Bld 105 (*)    Albumin 3.4 (*)    All other components within normal limits  URINALYSIS, ROUTINE W REFLEX MICROSCOPIC - Abnormal; Notable for the following:    APPearance CLOUDY (*)    Leukocytes, UA MODERATE (*)    All other components within normal limits  URINE MICROSCOPIC-ADD ON - Abnormal; Notable for the following:    Squamous Epithelial / LPF MANY (*)    Bacteria, UA MANY (*)    All other components within normal limits  D-DIMER, QUANTITATIVE  PREGNANCY, URINE    Imaging Review Dg Chest 2 View  08/28/2014   CLINICAL DATA:  Initial encounter for two-day history shortness of breath with some slight chest pain  EXAM: CHEST  2 VIEW  COMPARISON:  04/29/2014.  FINDINGS: The heart size and mediastinal contours are within normal limits. Both lungs are clear. The visualized skeletal structures are unremarkable.  IMPRESSION: Normal exam.   Electronically Signed   By: Kennith Center M.D.   On: 08/28/2014 14:57     EKG Interpretation   Date/Time:  Wednesday August 28 2014 14:15:37 EST Ventricular Rate:  66 PR Interval:  120 QRS Duration: 88 QT  Interval:  390 QTC Calculation: 408 R Axis:   81 Text Interpretation:  Sinus rhythm with marked sinus arrhythmia Otherwise  normal ECG No old tracing to compare Confirmed by MILLER  MD, BRIAN  [phone]) on 08/28/2014 2:24:31 PM      3:38 PM Symptoms improved, but patient endorses still feeling shaking. Her friend with her states the patient has been drinking an increased amount of caffeinated beverages/energy drinks.   MDM   Final diagnoses:  Shortness of breath  UTI (lower urinary tract infection)    Filed Vitals:   08/28/14 1549  BP: 101/65  Pulse: 84  Temp:   Resp: 16   Afebrile, NAD, non-toxic appearing, AAOx4.   1) SOB and shakiness: Patient is to be discharged with recommendation to follow up with PCP in regards to today's hospital visit. SOB is not likely of cardiac or pulmonary etiology d/t presentation, VSS, no tracheal deviation, no JVD or new murmur, RRR, breath  sounds equal bilaterally, EKG without acute abnormalities, negative troponin, negative D-dimer and negative CXR. Pt has been advised discontinue excessive use of caffeine/energy drinks return to the ED is CP becomes exertional, associated with diaphoresis or nausea, radiates to left jaw/arm, worsens or becomes concerning in any way. Pt appears reliable for follow up and is agreeable to discharge.   2) UTI: Pt has been diagnosed with a UTI. Pt is afebrile, no CVA tenderness, abdominal examination is benign, normotensive, and denies N/V. Pt to be dc home with antibiotics and instructions to follow up with PCP if symptoms persist.   Patient is stable at time of discharge Patient d/w with Dr. Hyacinth MeekerMiller, agrees with plan.    Jeannetta EllisJennifer L Massimiliano Rohleder, PA-C 08/28/14 1729  Vida RollerBrian D Miller, MD 08/29/14 [phone]

## 2014-10-14 ENCOUNTER — Emergency Department (HOSPITAL_BASED_OUTPATIENT_CLINIC_OR_DEPARTMENT_OTHER): Payer: 59

## 2014-10-14 ENCOUNTER — Emergency Department (HOSPITAL_BASED_OUTPATIENT_CLINIC_OR_DEPARTMENT_OTHER)
Admission: EM | Admit: 2014-10-14 | Discharge: 2014-10-14 | Disposition: A | Discharge status: Home / Self Care | Payer: 59 | Attending: Emergency Medicine | Admitting: Emergency Medicine

## 2014-10-14 ENCOUNTER — Encounter (HOSPITAL_BASED_OUTPATIENT_CLINIC_OR_DEPARTMENT_OTHER): Payer: Self-pay | Admitting: Emergency Medicine

## 2014-10-14 DIAGNOSIS — J069 Acute upper respiratory infection, unspecified: Secondary | ICD-10-CM

## 2014-10-14 DIAGNOSIS — Z79899 Other long term (current) drug therapy: Secondary | ICD-10-CM | POA: Diagnosis not present

## 2014-10-14 DIAGNOSIS — R059 Cough, unspecified: Secondary | ICD-10-CM

## 2014-10-14 DIAGNOSIS — Z791 Long term (current) use of non-steroidal anti-inflammatories (NSAID): Secondary | ICD-10-CM | POA: Insufficient documentation

## 2014-10-14 DIAGNOSIS — R05 Cough: Secondary | ICD-10-CM

## 2014-10-14 DIAGNOSIS — Z720 Tobacco use: Secondary | ICD-10-CM | POA: Insufficient documentation

## 2014-10-14 DIAGNOSIS — Z8639 Personal history of other endocrine, nutritional and metabolic disease: Secondary | ICD-10-CM | POA: Insufficient documentation

## 2014-10-14 DIAGNOSIS — G43909 Migraine, unspecified, not intractable, without status migrainosus: Secondary | ICD-10-CM | POA: Diagnosis not present

## 2014-10-14 DIAGNOSIS — Z79818 Long term (current) use of other agents affecting estrogen receptors and estrogen levels: Secondary | ICD-10-CM | POA: Insufficient documentation

## 2014-10-14 MED ORDER — BENZONATATE 100 MG PO CAPS: 100.0000 mg | ORAL_CAPSULE | Freq: Three times a day (TID) | ORAL | Status: DC

## 2014-10-14 NOTE — ED Provider Notes (Signed)
CSN: [id number]     Arrival date & time 10/14/14  1049 History   First MD Initiated Contact with Patient 10/14/14 1115     Chief Complaint  Patient presents with  . URI     (Consider location/radiation/quality/duration/timing/severity/associated sxs/prior Treatment) Patient is a 24 y.o. female presenting with URI. No language interpreter was used.  URI Presenting symptoms: congestion, cough and sore throat   Presenting symptoms: no fever   Severity:  Moderate Onset quality:  Sudden Duration:  1 week Timing:  Constant Progression:  Unchanged Chronicity:  New Relieved by:  Nothing Worsened by:  Nothing tried Ineffective treatments:  None tried Associated symptoms: no neck pain, no sinus pain and no wheezing     Past Medical History  Diagnosis Date  . Deficiency of potassium   . Migraine   . Hypokalemia    No past surgical history on file. Family History  Problem Relation Age of Onset  . COPD Mother    History  Substance Use Topics  . Smoking status: Current Some Day Smoker -- 0.00 packs/day  . Smokeless tobacco: Never Used  . Alcohol Use: No   OB History    No data available     Review of Systems  Constitutional: Negative for fever.  HENT: Positive for congestion and sore throat.   Respiratory: Positive for cough. Negative for wheezing.   Musculoskeletal: Negative for neck pain.  All other systems reviewed and are negative.     Allergies  Review of patient's allergies indicates no known allergies.  Home Medications   Prior to Admission medications   Medication Sig Start Date End Date Taking? Authorizing Provider  norgestimate-ethinyl estradiol (ORTHO-CYCLEN,SPRINTEC,PREVIFEM) 0.25-35 MG-MCG tablet Take 1 tablet by mouth daily. 06/20/14  Yes Sheliah HatchKatherine E Tabori, MD  traZODone (DESYREL) 50 MG tablet Take 0.5-1 tablets (25-50 mg total) by mouth at bedtime as needed for sleep. 06/20/14  Yes Sheliah HatchKatherine E Tabori, MD  LORazepam (ATIVAN) 1 MG tablet Take 1 tablet  (1 mg total) by mouth 3 (three) times daily as needed for anxiety (Shakiness). 08/28/14   Jennifer L Piepenbrink, PA-C  naproxen (NAPROSYN) 500 MG tablet Take 1 tablet (500 mg total) by mouth 2 (two) times daily with a meal. 08/28/14   Jennifer L Piepenbrink, PA-C  SUMAtriptan (IMITREX) 50 MG tablet Take 1 tablet (50 mg total) by mouth every 2 (two) hours as needed for migraine or headache. May repeat in 2 hours if headache persists or recurs. Max number of tabs per 24 hrs is 2. 08/23/14   Bayard BeaverEdward M Saguier, PA-C   BP 107/62 mmHg  Pulse 98  Temp(Src) 98.3 F (36.8 C) (Oral)  Resp 16  Ht 5\' 1"  (1.549 m)  Wt 105 lb (47.628 kg)  BMI 19.85 kg/m2  SpO2 96%  LMP 09/23/2014 Physical Exam  Constitutional: She is oriented to person, place, and time. She appears well-developed and well-nourished.  HENT:  Head: Normocephalic and atraumatic.  Right Ear: External ear normal.  Left Ear: External ear normal.  Cardiovascular: Normal rate and regular rhythm.   Pulmonary/Chest: Effort normal and breath sounds normal. She has no wheezes.  Musculoskeletal: Normal range of motion.  Neurological: She is alert and oriented to person, place, and time.  Skin: Skin is warm and dry.  Psychiatric: She has a normal mood and affect.  Nursing note and vitals reviewed.   ED Course  Procedures (including critical care time) Labs Review Labs Reviewed - No data to display  Imaging Review Dg Chest 2  View  10/14/2014   CLINICAL DATA:  Cough and congestion for 1 week  EXAM: CHEST  2 VIEW  COMPARISON:  08/28/2014  FINDINGS: Cardiac shadow is within normal limits. The lungs are well aerated bilaterally without focal infiltrate or sizable effusion. No bony abnormality is seen.  IMPRESSION: No active cardiopulmonary disease.   Electronically Signed   By: Alcide Clever M.D.   On: 10/14/2014 11:42     EKG Interpretation None      MDM   Final diagnoses:  Cough  URI (upper respiratory infection)    No infection  noted. Will treat symptomatically with tessalon.     Teressa Lower, NP 10/14/14 1249  Gwyneth Sprout, MD 10/14/14 1318

## 2014-10-14 NOTE — Discharge Instructions (Signed)
Cough, Adult ° A cough is a reflex. It helps you clear your throat and airways. A cough can help heal your body. A cough can last 2 or 3 weeks (acute) or may last more than 8 weeks (chronic). Some common causes of a cough can include an infection, allergy, or a cold. °HOME CARE °· Only take medicine as told by your doctor. °· If given, take your medicines (antibiotics) as told. Finish them even if you start to feel better. °· Use a cold steam vaporizer or humidifier in your home. This can help loosen thick spit (secretions). °· Sleep so you are almost sitting up (semi-upright). Use pillows to do this. This helps reduce coughing. °· Rest as needed. °· Stop smoking if you smoke. °GET HELP RIGHT AWAY IF: °· You have yellowish-white fluid (pus) in your thick spit. °· Your cough gets worse. °· Your medicine does not reduce coughing, and you are losing sleep. °· You cough up blood. °· You have trouble breathing. °· Your pain gets worse and medicine does not help. °· You have a fever. °MAKE SURE YOU:  °· Understand these instructions. °· Will watch your condition. °· Will get help right away if you are not doing well or get worse. °Document Released: 05/20/2011 Document Revised: 01/21/2014 Document Reviewed: 05/20/2011 °ExitCare® Patient Information ©2015 [employer]. This information is not intended to replace advice given to you by your health care provider. Make sure you discuss any questions you have with your health care provider. ° °Upper Respiratory Infection, Adult °An upper respiratory infection (URI) is also known as the common cold. It is often caused by a type of germ (virus). Colds are easily spread (contagious). You can pass it to others by kissing, coughing, sneezing, or drinking out of the same glass. Usually, you get better in 1 or 2 weeks.  °HOME CARE  °· Only take medicine as told by your doctor. °· Use a warm mist humidifier or breathe in steam from a hot shower. °· Drink enough water and fluids to  keep your pee (urine) clear or pale yellow. °· Get plenty of rest. °· Return to work when your temperature is back to normal or as told by your doctor. You may use a face mask and wash your hands to stop your cold from spreading. °GET HELP RIGHT AWAY IF:  °· After the first few days, you feel you are getting worse. °· You have questions about your medicine. °· You have chills, shortness of breath, or brown or red spit (mucus). °· You have yellow or brown snot (nasal discharge) or pain in the face, especially when you bend forward. °· You have a fever, puffy (swollen) neck, pain when you swallow, or white spots in the back of your throat. °· You have a bad headache, ear pain, sinus pain, or chest pain. °· You have a high-pitched whistling sound when you breathe in and out (wheezing). °· You have a lasting cough or cough up blood. °· You have sore muscles or a stiff neck. °MAKE SURE YOU:  °· Understand these instructions. °· Will watch your condition. °· Will get help right away if you are not doing well or get worse. °Document Released: 02/23/2008 Document Revised: 11/29/2011 Document Reviewed: 12/12/2013 °ExitCare® Patient Information ©2015 [employer], [employer]. This information is not intended to replace advice given to you by your health care provider. Make sure you discuss any questions you have with your health care provider. ° °

## 2014-10-14 NOTE — ED Notes (Signed)
Nasal congestion with chest congestion for one week.  Some coughing.  Decreased appetite.  No fever.

## 2014-11-28 ENCOUNTER — Other Ambulatory Visit: Payer: Self-pay

## 2014-11-28 ENCOUNTER — Emergency Department (HOSPITAL_BASED_OUTPATIENT_CLINIC_OR_DEPARTMENT_OTHER)
Admission: EM | Admit: 2014-11-28 | Discharge: 2014-11-28 | Disposition: A | Discharge status: Home / Self Care | Payer: 59 | Attending: Emergency Medicine | Admitting: Emergency Medicine

## 2014-11-28 ENCOUNTER — Encounter (HOSPITAL_BASED_OUTPATIENT_CLINIC_OR_DEPARTMENT_OTHER): Payer: Self-pay | Admitting: *Deleted

## 2014-11-28 ENCOUNTER — Emergency Department (HOSPITAL_BASED_OUTPATIENT_CLINIC_OR_DEPARTMENT_OTHER): Payer: 59

## 2014-11-28 DIAGNOSIS — Z8639 Personal history of other endocrine, nutritional and metabolic disease: Secondary | ICD-10-CM | POA: Insufficient documentation

## 2014-11-28 DIAGNOSIS — Z791 Long term (current) use of non-steroidal anti-inflammatories (NSAID): Secondary | ICD-10-CM | POA: Diagnosis not present

## 2014-11-28 DIAGNOSIS — R0789 Other chest pain: Secondary | ICD-10-CM | POA: Insufficient documentation

## 2014-11-28 DIAGNOSIS — J3489 Other specified disorders of nose and nasal sinuses: Secondary | ICD-10-CM | POA: Diagnosis not present

## 2014-11-28 DIAGNOSIS — Z87891 Personal history of nicotine dependence: Secondary | ICD-10-CM | POA: Insufficient documentation

## 2014-11-28 DIAGNOSIS — G43819 Other migraine, intractable, without status migrainosus: Secondary | ICD-10-CM | POA: Insufficient documentation

## 2014-11-28 DIAGNOSIS — Z3202 Encounter for pregnancy test, result negative: Secondary | ICD-10-CM | POA: Diagnosis not present

## 2014-11-28 DIAGNOSIS — Z79899 Other long term (current) drug therapy: Secondary | ICD-10-CM | POA: Insufficient documentation

## 2014-11-28 DIAGNOSIS — R079 Chest pain, unspecified: Secondary | ICD-10-CM

## 2014-11-28 DIAGNOSIS — R51 Headache: Secondary | ICD-10-CM | POA: Diagnosis present

## 2014-11-28 LAB — PREGNANCY, URINE: Preg Test, Ur: NEGATIVE

## 2014-11-28 MED ORDER — SODIUM CHLORIDE 0.9 % IV BOLUS (SEPSIS)
1000.0000 mL | Freq: Once | INTRAVENOUS | Status: AC
  Administered 2014-11-28: 1000 mL via INTRAVENOUS

## 2014-11-28 MED ORDER — PROMETHAZINE HCL 25 MG/ML IJ SOLN
12.5000 mg | Freq: Once | INTRAMUSCULAR | Status: AC
  Administered 2014-11-28: 12.5 mg via INTRAVENOUS
  Filled 2014-11-28: qty 1

## 2014-11-28 MED ORDER — DIPHENHYDRAMINE HCL 25 MG PO CAPS
25.0000 mg | ORAL_CAPSULE | Freq: Once | ORAL | Status: AC
  Administered 2014-11-28: 25 mg via ORAL
  Filled 2014-11-28: qty 1

## 2014-11-28 MED ORDER — SODIUM CHLORIDE 0.9 % IV SOLN
INTRAVENOUS | Status: DC
  Administered 2014-11-28: 21:00:00 via INTRAVENOUS

## 2014-11-28 MED ORDER — DEXAMETHASONE SODIUM PHOSPHATE 10 MG/ML IJ SOLN
10.0000 mg | Freq: Once | INTRAMUSCULAR | Status: AC
  Administered 2014-11-28: 10 mg via INTRAVENOUS
  Filled 2014-11-28: qty 1

## 2014-11-28 MED ORDER — SODIUM CHLORIDE 0.9 % IV SOLN
INTRAVENOUS | Status: DC
Start: 2014-11-28 — End: 2014-11-29

## 2014-11-28 NOTE — ED Notes (Signed)
Patient transported to X-ray and returned 

## 2014-11-28 NOTE — ED Notes (Signed)
Pt c/o migraine since yesterday and a mid-sternal squeezing type chest pain that started today.

## 2014-11-28 NOTE — Discharge Instructions (Signed)
The home and rest. The headache should resolve completely. Return for any new or worse symptoms. If you have trouble with recurrent headaches follow-up with your doctor. Workup for the chest pain was negative. Work note provided if needed.

## 2014-11-28 NOTE — ED Provider Notes (Signed)
CSN: [id number]     Arrival date & time 11/28/14  1508 History  This chart was scribed for Vanetta Mulders, MD by Annye Asa, ED Scribe. This patient was seen in room MH11/MH11 and the patient's care was started at 6:20 PM.    Chief Complaint  Patient presents with  . Headache   Patient is a 24 y.o. female presenting with headaches. The history is provided by the patient. No language interpreter was used.  Headache Pain location:  R temporal and frontal Quality: throbbing. Radiates to:  Does not radiate Severity currently:  6/10 Severity at highest:  Unable to specify Onset quality:  Gradual Duration:  2 days Timing:  Constant Progression:  Unchanged Chronicity:  Recurrent Similar to prior headaches: yes   Relieved by:  None tried Worsened by:  Light Ineffective treatments:  None tried Associated symptoms: photophobia   Associated symptoms: no abdominal pain, no back pain, no cough, no diarrhea, no fever, no nausea, no sore throat and no vomiting      HPI Comments: Tammy Mejia is a 24 y.o. female with past medical history of migraine who presents to the Emergency Department complaining of migraine headache with associated photophobia beginning two days PTA (the afternoon of 11/26/14). She localizes her headache to her right forehead and temporal area, rating it 6/10 at present and "throbbing" in nature. She denies nausea, vomiting. Her current headache is similar to those previous: she states that she does not have any "regular medications" for this issue, and those she was previously prescribed were ineffective. No treatments or medications tried PTA.   She also notes intermittent substernal chest pain beginning around 10:00 this morning. She denies SOB.   Past Medical History  Diagnosis Date  . Deficiency of potassium   . Migraine   . Hypokalemia    History reviewed. No pertinent past surgical history. Family History  Problem Relation Age of Onset  . COPD Mother     History  Substance Use Topics  . Smoking status: Former Smoker -- 0.00 packs/day  . Smokeless tobacco: Never Used  . Alcohol Use: No   OB History    No data available     Review of Systems  Constitutional: Negative for fever and chills.  HENT: Positive for rhinorrhea. Negative for sore throat.   Eyes: Positive for photophobia. Negative for visual disturbance.  Respiratory: Negative for cough and shortness of breath.   Cardiovascular: Positive for chest pain. Negative for leg swelling.  Gastrointestinal: Negative for nausea, vomiting, abdominal pain and diarrhea.  Genitourinary: Negative for dysuria, frequency and hematuria.  Musculoskeletal: Negative for back pain.  Skin: Negative for rash.  Neurological: Positive for headaches.  Hematological: Does not bruise/bleed easily.  Psychiatric/Behavioral: Negative for confusion.    Allergies  Review of patient's allergies indicates no known allergies.  Home Medications   Prior to Admission medications   Medication Sig Start Date End Date Taking? Authorizing Provider  benzonatate (TESSALON) 100 MG capsule Take 1 capsule (100 mg total) by mouth every 8 (eight) hours. 10/14/14   Teressa Lower, NP  LORazepam (ATIVAN) 1 MG tablet Take 1 tablet (1 mg total) by mouth 3 (three) times daily as needed for anxiety (Shakiness). 08/28/14   Jennifer Piepenbrink, PA-C  naproxen (NAPROSYN) 500 MG tablet Take 1 tablet (500 mg total) by mouth 2 (two) times daily with a meal. 08/28/14   Francee Piccolo, PA-C  norgestimate-ethinyl estradiol (ORTHO-CYCLEN,SPRINTEC,PREVIFEM) 0.25-35 MG-MCG tablet Take 1 tablet by mouth daily. 06/20/14   Helane Rima  Beverely Lowabori, MD  SUMAtriptan (IMITREX) 50 MG tablet Take 1 tablet (50 mg total) by mouth every 2 (two) hours as needed for migraine or headache. May repeat in 2 hours if headache persists or recurs. Max number of tabs per 24 hrs is 2. 08/23/14   Bayard BeaverEdward M Saguier, PA-C  traZODone (DESYREL) 50 MG tablet Take 0.5-1  tablets (25-50 mg total) by mouth at bedtime as needed for sleep. 06/20/14   Sheliah HatchKatherine E Tabori, MD   BP 103/68 mmHg  Pulse 74  Temp(Src) 98.9 F (37.2 C) (Oral)  Resp 16  Ht 5\' 1"  (1.549 m)  Wt 104 lb (47.174 kg)  BMI 19.66 kg/m2  SpO2 100%  LMP 11/19/2014 Physical Exam  Constitutional: She is oriented to person, place, and time. She appears well-developed and well-nourished.  HENT:  Head: Normocephalic and atraumatic.  Moist mucous membranes   Eyes: EOM are normal. Pupils are equal, round, and reactive to light. No scleral icterus.  Neck: No tracheal deviation present.  Cardiovascular: Normal rate, regular rhythm and normal heart sounds.   No murmur heard. Pulmonary/Chest: Effort normal and breath sounds normal. No respiratory distress. She has no wheezes. She has no rales.  Abdominal: Soft. Bowel sounds are normal. There is no tenderness.  Musculoskeletal: Normal range of motion. She exhibits no edema.  Neurological: She is alert and oriented to person, place, and time.  Skin: Skin is warm and dry.  Psychiatric: She has a normal mood and affect. Her behavior is normal.  Nursing note and vitals reviewed.   ED Course  Procedures   DIAGNOSTIC STUDIES: Oxygen Saturation is 100% on RA, normal by my interpretation.    COORDINATION OF CARE: 6:26 PM Discussed treatment plan with pt at bedside and pt agreed to plan.   Labs Review Labs Reviewed  PREGNANCY, URINE   Results for orders placed or performed during the hospital encounter of 11/28/14  Pregnancy, urine  Result Value Ref Range   Preg Test, Ur NEGATIVE NEGATIVE   Dg Chest 2 View  11/28/2014   CLINICAL DATA:  Chest pain since this morning. Migraines since Tuesday. Smoker.  EXAM: CHEST  2 VIEW  COMPARISON:  10/14/2014  FINDINGS: The heart size and mediastinal contours are within normal limits. Both lungs are clear. The visualized skeletal structures are unremarkable.  IMPRESSION: No active cardiopulmonary disease.    Electronically Signed   By: Burman NievesWilliam  Stevens M.D.   On: 11/28/2014 19:22      Imaging Review No results found.   EKG Interpretation   Date/Time:  Thursday November 28 2014 15:40:30 EST Ventricular Rate:  83 PR Interval:  120 QRS Duration: 78 QT Interval:  354 QTC Calculation: 415 R Axis:   87 Text Interpretation:  Normal sinus rhythm with sinus arrhythmia  Nonspecific T wave abnormality Abnormal ECG ED PHYSICIAN INTERPRETATION  AVAILABLE IN [facility] Confirmed by TEST, Record ([id number]) on  11/30/2014 12:58:44 PM      MDM   Final diagnoses:  Chest pain  Other migraine without status migrainosus, intractable   Patient with history of migraines. Migraine improved with migraine cocktail. Migraine is typical for her migraines. Patient edition with the complaint of chest pain. Workup for that without any acute findings chest x-ray was negative. EKG without acute changes.   I personally performed the services described in this documentation, which was scribed in my presence. The recorded information has been reviewed and is accurate.       Vanetta MuldersScott Darrik Richman, MD 12/04/14 [id number]

## 2015-01-08 ENCOUNTER — Ambulatory Visit (INDEPENDENT_AMBULATORY_CARE_PROVIDER_SITE_OTHER): Payer: 59 | Admitting: Medical

## 2015-01-08 ENCOUNTER — Encounter: Payer: Self-pay | Admitting: Medical

## 2015-01-08 VITALS — BP 103/70 | HR 84 | Temp 98.4°F | Ht 60.5 in | Wt 102.0 lb

## 2015-01-08 DIAGNOSIS — M791 Myalgia: Secondary | ICD-10-CM | POA: Diagnosis not present

## 2015-01-08 DIAGNOSIS — M609 Myositis, unspecified: Secondary | ICD-10-CM | POA: Diagnosis not present

## 2015-01-08 DIAGNOSIS — J029 Acute pharyngitis, unspecified: Secondary | ICD-10-CM

## 2015-01-08 DIAGNOSIS — IMO0001 Reserved for inherently not codable concepts without codable children: Secondary | ICD-10-CM | POA: Insufficient documentation

## 2015-01-08 LAB — POCT INFLUENZA A/B
Influenza A, POC: NEGATIVE
Influenza B, POC: NEGATIVE

## 2015-01-08 LAB — POCT RAPID STREP A (OFFICE): Rapid Strep A Screen: NEGATIVE

## 2015-01-08 MED ORDER — BENZONATATE 100 MG PO CAPS: 100.0000 mg | ORAL_CAPSULE | Freq: Three times a day (TID) | ORAL | Status: DC | PRN

## 2015-01-08 MED ORDER — AZITHROMYCIN 250 MG PO TABS: ORAL_TABLET | ORAL | Status: DC

## 2015-01-08 NOTE — Progress Notes (Signed)
Subjective:    Patient ID: Tammy Mejia, female    DOB: 07/15/1991, 24 y.o.   MRN: [id number]  HPI  Pt in with cough for 3 days. Coughing is getting worse. Almost day and night. Barely can talk without coughing. No wheezing.No hx of asthma. Pt denies any obvious allergy type symptoms.   Pt has no fever now. Feels real tired and severe body aches cam on at work.  Soret as well past week. This was first symptom.  LMP- December 13, 2014.   Review of Systems  Constitutional: Positive for fatigue. Negative for fever and chills.  HENT: Positive for sore throat. Negative for congestion, ear pain, postnasal drip, rhinorrhea and sinus pressure.   Respiratory: Positive for cough. Negative for chest tightness, shortness of breath and wheezing.   Cardiovascular: Negative for chest pain and palpitations.  Gastrointestinal: Negative for abdominal pain.  Genitourinary: Negative.   Musculoskeletal:       Diffuse body aches within past 24 hours.  Neurological: Negative for dizziness, syncope, weakness and headaches.  Hematological: Negative for adenopathy. Does not bruise/bleed easily.  Psychiatric/Behavioral: Negative for behavioral problems and confusion.   Past Medical History  Diagnosis Date  . Deficiency of potassium   . Migraine   . Hypokalemia     History   Social History  . Marital Status: Single    Spouse Name: N/A  . Number of Children: N/A  . Years of Education: N/A   Occupational History  . Not on file.   Social History Main Topics  . Smoking status: Former Smoker -- 0.00 packs/day  . Smokeless tobacco: Never Used  . Alcohol Use: No  . Drug Use: No  . Sexual Activity: Yes    Birth Control/ Protection: Pill   Other Topics Concern  . Not on file   Social History Narrative    No past surgical history on file.  Family History  Problem Relation Age of Onset  . COPD Mother     No Known Allergies  Current Outpatient Prescriptions on File Prior to Visit    Medication Sig Dispense Refill  . SUMAtriptan (IMITREX) 50 MG tablet Take 1 tablet (50 mg total) by mouth every 2 (two) hours as needed for migraine or headache. May repeat in 2 hours if headache persists or recurs. Max number of tabs per 24 hrs is 2. 10 tablet 0  . traZODone (DESYREL) 50 MG tablet Take 0.5-1 tablets (25-50 mg total) by mouth at bedtime as needed for sleep. 30 tablet 3  . benzonatate (TESSALON) 100 MG capsule Take 1 capsule (100 mg total) by mouth every 8 (eight) hours. (Patient not taking: Reported on 01/08/2015) 21 capsule 0  . LORazepam (ATIVAN) 1 MG tablet Take 1 tablet (1 mg total) by mouth 3 (three) times daily as needed for anxiety (Shakiness). (Patient not taking: Reported on 01/08/2015) 15 tablet 0  . naproxen (NAPROSYN) 500 MG tablet Take 1 tablet (500 mg total) by mouth 2 (two) times daily with a meal. (Patient not taking: Reported on 01/08/2015) 30 tablet 0  . norgestimate-ethinyl estradiol (ORTHO-CYCLEN,SPRINTEC,PREVIFEM) 0.25-35 MG-MCG tablet Take 1 tablet by mouth daily. 1 Package 11   No current facility-administered medications on file prior to visit.    BP 103/70 mmHg  Pulse 84  Temp(Src) 98.4 F (36.9 C) (Oral)  Ht 5' 0.5" (1.537 m)  Wt 102 lb (46.267 kg)  BMI 19.59 kg/m2  SpO2 100%  LMP 12/12/2014      Objective:   Physical Exam  General  Mental Status - Alert. General Appearance - Well groomed. Not in acute distress.  Skin Rashes- No Rashes.  HEENT Head- Normal. Ear Auditory Canal - Left- Normal. Right - Normal.Tympanic Membrane- Left- Normal. Right- Normal. Eye Sclera/Conjunctiva- Left- Normal. Right- Normal. Nose & Sinuses Nasal Mucosa- Left-  Notb oggy or Congested. Right-  Not  boggy or Congested. Mouth & Throat Lips: Upper Lip- Normal: no dryness, cracking, pallor, cyanosis, or vesicular eruption. Lower Lip-Normal: no dryness, cracking, pallor, cyanosis or vesicular eruption. Buccal Mucosa- Bilateral- No Aphthous ulcers. Oropharynx-  No Discharge or Erythema. Tonsils: Characteristics- Bilateral-  Moderate bright  Erythema +  Congestion. Size/Enlargement- Bilateral- 2+ enlargement. Discharge- bilateral-None.  Neck Neck- Supple. No Masses. Moderate tender submandibular nodes.   Chest and Lung Exam Auscultation: Breath Sounds:- even and unlabored  Cardiovascular Auscultation:Rythm- Regular, rate and rhythm. Murmurs & Other Heart Sounds:Ausculatation of the heart reveal- No Murmurs.  Lymphatic Head & Neck General Head & Neck Lymphatics: Bilateral: Description- No Localized lymphadenopathy.       Assessment & Plan:

## 2015-01-08 NOTE — Assessment & Plan Note (Signed)
This may represent with viral or bacterial infection. By appearance of your throat etiology favors strep bacteria.

## 2015-01-08 NOTE — Progress Notes (Signed)
Pre visit review using our clinic review tool, if applicable. No additional management support is needed unless otherwise documented below in the visit note. 

## 2015-01-08 NOTE — Patient Instructions (Addendum)
Myalgia and myositis This may represent with viral or bacterial infection. By appearance of your throat etiology favors strep bacteria.   Acute pharyngitis Your strep test was negative. However, your physical exam and clinical presentation is suspicious for strep and it is important to note that rapid strep test can be falsely negative. So I am going to give you azithromycin  antibiotic today based on your exam and clinical presentation.  Rest hydrate, tylenol for fever, and warm salt water gargles.   Follow up in 7 days or as needed.      Note both strep and flu test were negative.  For cough benzonatate. Alleve otc for body aches.

## 2015-01-08 NOTE — Assessment & Plan Note (Signed)
Your strep test was negative. However, your physical exam and clinical presentation is suspicious for strep and it is important to note that rapid strep test can be falsely negative. So I am going to give you azithromycin  antibiotic today based on your exam and clinical presentation.  Rest hydrate, tylenol for fever, and warm salt water gargles.   Follow up in 7 days or as needed.    

## 2015-02-10 ENCOUNTER — Encounter: Payer: Self-pay | Admitting: General Practice

## 2015-02-10 ENCOUNTER — Encounter: Payer: Self-pay | Admitting: Family Medicine

## 2015-02-10 ENCOUNTER — Ambulatory Visit (INDEPENDENT_AMBULATORY_CARE_PROVIDER_SITE_OTHER): Payer: 59 | Admitting: Family Medicine

## 2015-02-10 VITALS — BP 104/74 | HR 62 | Temp 98.1°F | Resp 16 | Wt 104.5 lb

## 2015-02-10 DIAGNOSIS — G43009 Migraine without aura, not intractable, without status migrainosus: Secondary | ICD-10-CM | POA: Diagnosis not present

## 2015-02-10 MED ORDER — RIZATRIPTAN BENZOATE 10 MG PO TABS: 10.0000 mg | ORAL_TABLET | ORAL | Status: DC | PRN

## 2015-02-10 MED ORDER — KETOROLAC TROMETHAMINE 60 MG/2ML IM SOLN
60.0000 mg | Freq: Once | INTRAMUSCULAR | Status: AC
  Administered 2015-02-10: 60 mg via INTRAMUSCULAR

## 2015-02-10 NOTE — Patient Instructions (Signed)
Follow up as needed At the first sign of headache, take Excedrin migraine If no improvement in headache in 30 minutes, take the Maxalt for the migraine Drink plenty of fluids- dehydration can trigger migraines Call with any questions or concerns Hang in there!!

## 2015-02-10 NOTE — Progress Notes (Signed)
Pre visit review using our clinic review tool, if applicable. No additional management support is needed unless otherwise documented below in the visit note. 

## 2015-02-10 NOTE — Progress Notes (Signed)
   Subjective:    Patient ID: Tammy CarneAshley Borkenhagen, female    DOB: 05/25/1991, 24 y.o.   MRN: [id number]  HPI Migraine- pt has hx of migraines.  Reports Imitrex is not effective.  Pt having migraines once monthly or every other month.  Current HA started yesterday.  No N/V.  + photophobia, no phonophobia.  No dizziness.  Pt has taken Advil and Aleve w/o improvement.  Pt reports this feels similar to other migraines.  Has never taken other migraine meds w/ exception of imitrex.  No relation to menses.   Review of Systems For ROS see HPI     Objective:   Physical Exam  Constitutional: She is oriented to person, place, and time. She appears well-developed and well-nourished. No distress.  HENT:  Head: Normocephalic and atraumatic.  TMs WNL No TTP over sinuses Minimal nasal congestion  Eyes: Conjunctivae and EOM are normal. Pupils are equal, round, and reactive to light.  Neck: Normal range of motion. Neck supple.  Cardiovascular: Normal rate, regular rhythm, normal heart sounds and intact distal pulses.   Pulmonary/Chest: Effort normal and breath sounds normal. No respiratory distress. She has no wheezes. She has no rales.  Lymphadenopathy:    She has no cervical adenopathy.  Neurological: She is alert and oriented to person, place, and time. She has normal reflexes. No cranial nerve deficit. Coordination normal.  Psychiatric: She has a normal mood and affect. Her behavior is normal. Judgment and thought content normal.  Vitals reviewed.         Assessment & Plan:

## 2015-02-10 NOTE — Assessment & Plan Note (Signed)
Deteriorated.  Pt reports she is going to ER either monthly or every other month.  Not taking Imitrex due to side effects- nausea.  Pt's PE WNL- no red flags on hx or PE.  Toradol injxn given in office.  Script for Maxalt given to use as abortive medication.  Reviewed supportive care and red flags that should prompt return.  Pt expressed understanding and is in agreement w/ plan.

## 2015-02-10 NOTE — Addendum Note (Signed)
Addended by: Jackson LatinoYLER, [place] Behlke L on: 02/10/2015 04:26 PM   Modules accepted: Orders

## 2015-03-04 ENCOUNTER — Emergency Department (HOSPITAL_BASED_OUTPATIENT_CLINIC_OR_DEPARTMENT_OTHER)
Admission: EM | Admit: 2015-03-04 | Discharge: 2015-03-04 | Disposition: A | Discharge status: Home / Self Care | Payer: 59 | Attending: Emergency Medicine | Admitting: Emergency Medicine

## 2015-03-04 ENCOUNTER — Emergency Department (HOSPITAL_BASED_OUTPATIENT_CLINIC_OR_DEPARTMENT_OTHER): Payer: 59

## 2015-03-04 ENCOUNTER — Encounter (HOSPITAL_BASED_OUTPATIENT_CLINIC_OR_DEPARTMENT_OTHER): Payer: Self-pay

## 2015-03-04 DIAGNOSIS — G43909 Migraine, unspecified, not intractable, without status migrainosus: Secondary | ICD-10-CM | POA: Diagnosis not present

## 2015-03-04 DIAGNOSIS — Z3202 Encounter for pregnancy test, result negative: Secondary | ICD-10-CM | POA: Diagnosis not present

## 2015-03-04 DIAGNOSIS — N649 Disorder of breast, unspecified: Secondary | ICD-10-CM | POA: Diagnosis not present

## 2015-03-04 DIAGNOSIS — N644 Mastodynia: Secondary | ICD-10-CM

## 2015-03-04 DIAGNOSIS — M25511 Pain in right shoulder: Secondary | ICD-10-CM | POA: Diagnosis present

## 2015-03-04 DIAGNOSIS — Z8639 Personal history of other endocrine, nutritional and metabolic disease: Secondary | ICD-10-CM | POA: Insufficient documentation

## 2015-03-04 DIAGNOSIS — Z79899 Other long term (current) drug therapy: Secondary | ICD-10-CM | POA: Insufficient documentation

## 2015-03-04 DIAGNOSIS — Z87891 Personal history of nicotine dependence: Secondary | ICD-10-CM | POA: Insufficient documentation

## 2015-03-04 DIAGNOSIS — G8929 Other chronic pain: Secondary | ICD-10-CM | POA: Insufficient documentation

## 2015-03-04 LAB — PREGNANCY, URINE: Preg Test, Ur: NEGATIVE

## 2015-03-04 MED ORDER — CEPHALEXIN 500 MG PO CAPS: 500.0000 mg | ORAL_CAPSULE | Freq: Four times a day (QID) | ORAL | Status: DC

## 2015-03-04 NOTE — ED Notes (Signed)
Patient transported to X-ray 

## 2015-03-04 NOTE — ED Provider Notes (Signed)
CSN: [id number]     Arrival date & time 03/04/15  1648 History   First MD Initiated Contact with Patient 03/04/15 1844     Chief Complaint  Patient presents with  . Shoulder Pain     (Consider location/radiation/quality/duration/timing/severity/associated sxs/prior Treatment) The history is provided by the patient. No language interpreter was used.  Tammy Mejia is a 24 year old female with past mental history of migraines and hypokalemia presenting to the ED with right shoulder pain that started approximate 2 weeks ago and nipple pain bilaterally that started approximate 4 days ago. 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 Stated that she lives heavy boxes, more than 25 pounds at least 5 days per week 8 hours per shift. Reported that there is a dull, aching throbbing pain that is constant with an right shoulder worse with motion. Stated that she's been using Advil with minimal relief. Stated that she is right-hand dominant. Patient reported that started approximately 4 days ago she started to experience nipple pain bilaterally that started at the same time described as a sharp pain with touching the nipple and laying on her stomach. Denied fever, chills, injury, fall, loss of sensation, neck pain, neck stiffness, blurred vision, sudden loss of vision, headache, weakness, changes to skin colored, rashes, itching, breast pain, lumps, bumps, swelling, asymmetry to the breasts, drainage from the nipple, chest pain, shortness of breath, difficulty breathing. Denied history of breast cancer in the family. Denied breast-feeding.  PCP Dr. Beverely Low  Past Medical History  Diagnosis Date  . Deficiency of potassium   . Migraine   . Hypokalemia    History reviewed. No pertinent past surgical history. Family History  Problem Relation Age of Onset  . COPD Mother     History  Substance Use Topics  . Smoking status: Former Smoker -- 0.00 packs/day  . Smokeless tobacco: Never Used  . Alcohol Use: No   OB History    No data available     Review of Systems  Constitutional: Negative for fever and chills.  Respiratory: Negative for chest tightness and shortness of breath.        Bilateral nipple pain  Cardiovascular: Negative for chest pain.  Musculoskeletal: Positive for arthralgias (right shoulder pain ). Negative for neck pain and neck stiffness.  Skin: Negative for color change, rash and wound.  Neurological: Negative for weakness and numbness.      Allergies  Review of patient's allergies indicates no known allergies.  Home Medications   Prior to Admission medications   Medication Sig Start Date End Date Taking? Authorizing Provider  norgestimate-ethinyl estradiol (ORTHO-CYCLEN,SPRINTEC,PREVIFEM) 0.25-35 MG-MCG tablet Take 1 tablet by mouth daily. 06/20/14   Sheliah Hatch, MD  rizatriptan (MAXALT) 10 MG tablet Take 1 tablet (10 mg total) by mouth as needed for migraine. May repeat in 2 hours if needed 02/10/15   Sheliah Hatch, MD  traZODone (DESYREL) 50 MG tablet Take 0.5-1 tablets (25-50 mg total) by mouth at bedtime as needed for sleep. 06/20/14   Sheliah Hatch, MD   BP 103/65 mmHg  Pulse 72  Temp(Src) 98.4 F (36.9 C) (Oral)  Resp 16  Ht  (1.626 m)  Wt 104 lb (47.174 kg)  BMI 17.84 kg/m2  SpO2 99%  LMP 02/03/2015 Physical Exam  Constitutional: She is oriented to person, place, and time. She appears well-developed  and well-nourished. No distress.  HENT:  Head: Normocephalic and atraumatic.  Eyes: Conjunctivae and EOM are normal. Right eye exhibits no discharge. Left eye exhibits no discharge.  Neck: Normal range of motion. Neck supple.    Tenderness upon palpation to the right trapezius muscle  Cardiovascular: Normal rate, regular rhythm and normal heart sounds.   Pulses:      Radial pulses are 2+ on the  right side, and 2+ on the left side.  Pulmonary/Chest: Effort normal and breath sounds normal. No respiratory distress. She has no wheezes. She has no rales.  Best exam: Negative swelling, erythema, inflammation, lesions, sores, deformities, asymmetry, puckering, abnormalities identified to the breasts bilaterally. Negative active drainage or bleeding noted bilaterally. Negative masses, cystic lesions palpated bilaterally. Negative areas of induration or fluctuance noted bilaterally to the breasts. Mild discomfort upon palpation to the nipples bilaterally with negative erythema, warmth upon palpation. Negative drainage or discharge noted when pressure is applied to the nipples bilaterally. Negative lymphadenopathy bilaterally, negative axillary lymphadenopathy.  Musculoskeletal: She exhibits tenderness. She exhibits no edema.       Right shoulder: She exhibits tenderness (Musculoskeletal). She exhibits normal range of motion, no bony tenderness, no swelling, no effusion, no deformity and no laceration.  Neurological: She is alert and oriented to person, place, and time. No cranial nerve deficit. She exhibits normal muscle tone. Coordination normal.  Skin: Skin is warm and dry. No rash noted. She is not diaphoretic. No erythema.  Psychiatric: She has a normal mood and affect. Her behavior is normal. Thought content normal.  Nursing note and vitals reviewed.   ED Course  Procedures (including critical care time)  Results for orders placed or performed during the hospital encounter of 03/04/15  Pregnancy, urine  Result Value Ref Range   Preg Test, Ur NEGATIVE NEGATIVE    Labs Review Labs Reviewed  PREGNANCY, URINE    Imaging Review Dg Shoulder Right  03/04/2015   CLINICAL DATA:  RIGHT shoulder pain for 2 weeks without injury, anterior pain, lifts heavy boxes at work daily  EXAM: RIGHT SHOULDER - 2+ VIEW  COMPARISON:  06/07/2014  FINDINGS: Osseous mineralization normal.  AC joint alignment  normal.  No acute fracture, dislocation or bone destruction.  Visualized ribs unremarkable.  IMPRESSION: Normal exam, unchanged.   Electronically Signed   By: Ulyses Southward M.D.   On: 03/04/2015 19:53     EKG Interpretation None       8:23 PM This provider discussed case in great detail with attending physician, Dr. Marylen Ponto. Does not recommend imaging or antibiotics at this moment. Recommended supportive therapy. Referral to PCP and worsening symptoms to report back to the ED.   MDM   Final diagnoses:  Chronic right shoulder pain  Nipple pain    Medications - No data to display  Filed Vitals:   03/04/15 1650 03/04/15 2019  BP: 106/71 103/65  Pulse: 94 72  Temp: 98.4 F (36.9 C)   TempSrc: Oral   Resp: 16 16  Height:  (1.626 m)   Weight: 104 lb (47.174 kg)   SpO2: 100% 99%    Urine pregnancy negative. Plain film of right shoulder unremarkable. Patient presenting to the ED with right shoulder pain that started approximate 2 weeks ago, patient reports she's been having right shoulder pains and she was 24 years of age. This appears to be a chronic issue. Breast exam unremarkable. Negative findings of masses, cystic lesions, areas of induration or fluctuance. Doubt breast abscess.  Tenderness upon palpation to the nipples bilaterally with negative erythema, drainage upon pressure. Negative lymphadenopathy noted to the axillary and cervical regions. Negative focal neurological deficits noted. Pulses palpable and strong. Full range of motion to the right upper extremity without difficulty. Negative drop arm. Negative apprehension test. Unremarkable imaging. Patient stable, afebrile. Patient not septic appearing. Negative signs of respiratory distress. Discharged patient. Discussed with patient to apply cool compressions to the shoulder to the nipples bilaterally. Discussed with patient to avoid any physical or strenuous activity. Discussed with patient to avoid heavy lifting for the next  couple of days. Referred patient to PCP, breast clinic, orthopedics. Discussed with patient to closely monitor symptoms and if symptoms are to worsen or change to report back to the ED - strict return instructions given.  Patient agreed to plan of care, understood, all questions answered.   Raymon Mutton, PA-C 03/04/15 2024  Raeford Razor, MD 03/05/15 1452

## 2015-03-04 NOTE — Discharge Instructions (Signed)
Please call your doctor for a followup appointment within 24-48 hours. When you talk to your doctor please let them know that you were seen in the emergency department and have them acquire all of your records so that they can discuss the findings with you and formulate a treatment plan to fully care for your new and ongoing problems. Please follow-up with her primary care provider Please follow-up with orthopedics regarding continuous right shoulder pain does been ongoing for approximately 10 years Follow-up with breast clinic Please apply cool compressions to the breasts bilaterally Please apply warm compressions and massage to the right shoulder Please avoid any physical or strenuous activity, please no heavy lifting for the next couple of days Please continue to monitor symptoms closely and if symptoms are to worsen or change (fever greater than 101, chills, sweating, nausea, vomiting, chest pain, shortness of breathe, difficulty breathing, weakness, numbness, tingling, worsening or changes to pain pattern, red streaks, pus drainage from nipples, increased pain, bleeding, swelling to the breasts, changes to breast appearance, changes to color, breast feels hot to the touch, fall, injury, loss of sensation to the arm, inability to hold objects) please report back to the Emergency Department immediately.    Arthralgia Your caregiver has diagnosed you as suffering from an arthralgia. Arthralgia means there is pain in a joint. This can come from many reasons including:  Bruising the joint which causes soreness (inflammation) in the joint.  Wear and tear on the joints which occur as we grow older (osteoarthritis).  Overusing the joint.  Various forms of arthritis.  Infections of the joint. Regardless of the cause of pain in your joint, most of these different pains respond to anti-inflammatory drugs and rest. The exception to this is when a joint is infected, and these cases are treated with  antibiotics, if it is a bacterial infection. HOME CARE INSTRUCTIONS   Rest the injured area for as long as directed by your caregiver. Then slowly start using the joint as directed by your caregiver and as the pain allows. Crutches as directed may be useful if the ankles, knees or hips are involved. If the knee was splinted or casted, continue use and care as directed. If an stretchy or elastic wrapping bandage has been applied today, it should be removed and re-applied every 3 to 4 hours. It should not be applied tightly, but firmly enough to keep swelling down. Watch toes and feet for swelling, bluish discoloration, coldness, numbness or excessive pain. If any of these problems (symptoms) occur, remove the ace bandage and re-apply more loosely. If these symptoms persist, contact your caregiver or return to this location.  For the first 24 hours, keep the injured extremity elevated on pillows while lying down.  Apply ice for 15-20 minutes to the sore joint every couple hours while awake for the first half day. Then 03-04 times per day for the first 48 hours. Put the ice in a plastic bag and place a towel between the bag of ice and your skin.  Wear any splinting, casting, elastic bandage applications, or slings as instructed.  Only take over-the-counter or prescription medicines for pain, discomfort, or fever as directed by your caregiver. Do not use aspirin immediately after the injury unless instructed by your physician. Aspirin can cause increased bleeding and bruising of the tissues.  If you were given crutches, continue to use them as instructed and do not resume weight bearing on the sore joint until instructed. Persistent pain and inability to use  the sore joint as directed for more than 2 to 3 days are warning signs indicating that you should see a caregiver for a follow-up visit as soon as possible. Initially, a hairline fracture (break in bone) may not be evident on X-rays. Persistent pain  and swelling indicate that further evaluation, non-weight bearing or use of the joint (use of crutches or slings as instructed), or further X-rays are indicated. X-rays may sometimes not show a small fracture until a week or 10 days later. Make a follow-up appointment with your own caregiver or one to whom we have referred you. A radiologist (specialist in reading X-rays) may read your X-rays. Make sure you know how you are to obtain your X-ray results. Do not assume everything is normal if you do not hear from [place]. SEEK MEDICAL CARE IF: Bruising, swelling, or pain increases. SEEK IMMEDIATE MEDICAL CARE IF:   Your fingers or toes are numb or blue.  The pain is not responding to medications and continues to stay the same or get worse.  The pain in your joint becomes severe.  You develop a fever over 102 F (38.9 C).  It becomes impossible to move or use the joint. MAKE SURE YOU:   Understand these instructions.  Will watch your condition.  Will get help right away if you are not doing well or get worse. Document Released: 09/06/2005 Document Revised: 11/29/2011 Document Reviewed: 04/24/2008 [facility] Patient Information 2015 [place], [place]. This information is not intended to replace advice given to you by your health care provider. Make sure you discuss any questions you have with your health care provider.

## 2015-03-04 NOTE — ED Notes (Signed)
C/o right shoulder pain x 2 weeks-denies injury-also bilat nipple pain

## 2015-04-01 ENCOUNTER — Encounter: Payer: Self-pay | Admitting: Physician Assistant

## 2015-04-01 ENCOUNTER — Ambulatory Visit (INDEPENDENT_AMBULATORY_CARE_PROVIDER_SITE_OTHER): Payer: 59 | Admitting: Physician Assistant

## 2015-04-01 VITALS — BP 97/73 | HR 70 | Temp 98.1°F | Ht 60.5 in | Wt 102.6 lb

## 2015-04-01 DIAGNOSIS — L089 Local infection of the skin and subcutaneous tissue, unspecified: Secondary | ICD-10-CM | POA: Diagnosis not present

## 2015-04-01 DIAGNOSIS — T148 Other injury of unspecified body region: Secondary | ICD-10-CM

## 2015-04-01 DIAGNOSIS — T148XXA Other injury of unspecified body region, initial encounter: Principal | ICD-10-CM

## 2015-04-01 MED ORDER — CEPHALEXIN 250 MG PO CAPS: 250.0000 mg | ORAL_CAPSULE | Freq: Three times a day (TID) | ORAL | Status: DC

## 2015-04-01 NOTE — Assessment & Plan Note (Signed)
Rx Keflex. Wound care discussed with patient. Follow-up if symptoms not improving or fever develops.

## 2015-04-01 NOTE — Progress Notes (Signed)
Pre visit review using our clinic review tool, if applicable. No additional management support is needed unless otherwise documented below in the visit note. 

## 2015-04-01 NOTE — Progress Notes (Signed)
    Patient presents to clinic today c/o concerns of skin infection at site of ankle laceration sustained last week. Patient endorses laceration occurred when shaving legs last week.  Denies fever, chills, malaise.  Past Medical History  Diagnosis Date  . Deficiency of potassium   . Migraine   . Hypokalemia     Current Outpatient Prescriptions on File Prior to Visit  Medication Sig Dispense Refill  . norgestimate-ethinyl estradiol (ORTHO-CYCLEN,SPRINTEC,PREVIFEM) 0.25-35 MG-MCG tablet Take 1 tablet by mouth daily. 1 Package 11  . rizatriptan (MAXALT) 10 MG tablet Take 1 tablet (10 mg total) by mouth as needed for migraine. May repeat in 2 hours if needed 10 tablet 3  . traZODone (DESYREL) 50 MG tablet Take 0.5-1 tablets (25-50 mg total) by mouth at bedtime as needed for sleep. 30 tablet 3   No current facility-administered medications on file prior to visit.    No Known Allergies  Family History  Problem Relation Age of Onset  . COPD Mother     History   Social History  . Marital Status: Single    Spouse Name: N/A  . Number of Children: N/A  . Years of Education: N/A   Social History Main Topics  . Smoking status: Former Smoker -- 0.00 packs/day  . Smokeless tobacco: Never Used  . Alcohol Use: No  . Drug Use: No  . Sexual Activity: Yes    Birth Control/ Protection: Pill   Other Topics Concern  . None   Social History Narrative   Review of Systems - See HPI.  All other ROS are negative.  BP 97/73 mmHg  Pulse 70  Temp(Src) 98.1 F (36.7 C) (Oral)  Ht 5' 0.5" (1.537 m)  Wt 102 lb 9.6 oz (46.539 kg)  BMI 19.70 kg/m2  SpO2 100%  LMP 03/14/2015  Physical Exam  Constitutional: She is oriented to person, place, and time and well-developed, well-nourished, and in no distress.  HENT:  Head: Normocephalic and atraumatic.  Cardiovascular: Normal rate, regular rhythm, normal heart sounds and intact distal pulses.   Pulmonary/Chest: Effort normal and breath sounds  normal. No respiratory distress. She has no wheezes. She has no rales. She exhibits no tenderness.  Neurological: She is alert and oriented to person, place, and time.  Skin: Skin is warm and dry.     Vitals reviewed.   Recent Results (from the past 2160 hour(s))  POCT rapid strep A     Status: Normal   Collection Time: 01/08/15  8:38 AM  Result Value Ref Range   Rapid Strep A Screen Negative Negative  POCT Influenza A/B     Status: Abnormal   Collection Time: 01/08/15  8:38 AM  Result Value Ref Range   Influenza A, POC Negative    Influenza B, POC Negative   Pregnancy, urine     Status: None   Collection Time: 03/04/15  7:15 PM  Result Value Ref Range   Preg Test, Ur NEGATIVE NEGATIVE    Comment:        THE SENSITIVITY OF THIS METHODOLOGY IS >20 mIU/mL.    Assessment/Plan: Infected laceration Rx Keflex. Wound care discussed with patient. Follow-up if symptoms not improving or fever develops.

## 2015-04-01 NOTE — Patient Instructions (Signed)
Please take antibiotic as directed. Increase fluids. Keep area covered with bandage when going out to prevent something from rubbing against it.  If anything worsens or you develop fever, return to office.  Otherwise complete all antibiotic and wound should heal over next few weeks.

## 2015-04-11 ENCOUNTER — Encounter (HOSPITAL_BASED_OUTPATIENT_CLINIC_OR_DEPARTMENT_OTHER): Payer: Self-pay

## 2015-04-11 ENCOUNTER — Emergency Department (HOSPITAL_BASED_OUTPATIENT_CLINIC_OR_DEPARTMENT_OTHER)
Admission: EM | Admit: 2015-04-11 | Discharge: 2015-04-11 | Disposition: A | Discharge status: Home / Self Care | Payer: 59 | Attending: Physician Assistant | Admitting: Physician Assistant

## 2015-04-11 DIAGNOSIS — Z8679 Personal history of other diseases of the circulatory system: Secondary | ICD-10-CM | POA: Diagnosis not present

## 2015-04-11 DIAGNOSIS — Z87891 Personal history of nicotine dependence: Secondary | ICD-10-CM | POA: Diagnosis not present

## 2015-04-11 DIAGNOSIS — Z8639 Personal history of other endocrine, nutritional and metabolic disease: Secondary | ICD-10-CM | POA: Diagnosis not present

## 2015-04-11 DIAGNOSIS — Z792 Long term (current) use of antibiotics: Secondary | ICD-10-CM | POA: Insufficient documentation

## 2015-04-11 DIAGNOSIS — Z3202 Encounter for pregnancy test, result negative: Secondary | ICD-10-CM | POA: Diagnosis not present

## 2015-04-11 DIAGNOSIS — Z793 Long term (current) use of hormonal contraceptives: Secondary | ICD-10-CM | POA: Insufficient documentation

## 2015-04-11 DIAGNOSIS — M545 Low back pain: Secondary | ICD-10-CM | POA: Diagnosis present

## 2015-04-11 HISTORY — DX: Dorsalgia, unspecified: M54.9

## 2015-04-11 LAB — URINALYSIS, ROUTINE W REFLEX MICROSCOPIC
Bilirubin Urine: NEGATIVE
Glucose, UA: NEGATIVE mg/dL
Hgb urine dipstick: NEGATIVE
KETONES UR: NEGATIVE mg/dL
NITRITE: NEGATIVE
PROTEIN: NEGATIVE mg/dL
SPECIFIC GRAVITY, URINE: 1.016 (ref 1.005–1.030)
Urobilinogen, UA: 1 mg/dL (ref 0.0–1.0)
pH: 6 (ref 5.0–8.0)

## 2015-04-11 LAB — URINE MICROSCOPIC-ADD ON

## 2015-04-11 LAB — PREGNANCY, URINE: PREG TEST UR: NEGATIVE

## 2015-04-11 MED ORDER — CYCLOBENZAPRINE HCL 10 MG PO TABS
10.0000 mg | ORAL_TABLET | Freq: Once | ORAL | Status: AC
  Administered 2015-04-11: 10 mg via ORAL
  Filled 2015-04-11: qty 1

## 2015-04-11 MED ORDER — IBUPROFEN 800 MG PO TABS
800.0000 mg | ORAL_TABLET | Freq: Once | ORAL | Status: AC
  Administered 2015-04-11: 800 mg via ORAL
  Filled 2015-04-11: qty 1

## 2015-04-11 MED ORDER — CYCLOBENZAPRINE HCL 10 MG PO TABS: 10.0000 mg | ORAL_TABLET | Freq: Two times a day (BID) | ORAL | Status: DC | PRN

## 2015-04-11 MED ORDER — IBUPROFEN 800 MG PO TABS: 800.0000 mg | ORAL_TABLET | Freq: Three times a day (TID) | ORAL | Status: DC

## 2015-04-11 NOTE — ED Provider Notes (Signed)
CSN: [id number]     Arrival date & time 04/11/15  1654 History   First MD Initiated Contact with Patient 04/11/15 1705     Chief Complaint  Patient presents with  . Back Pain     (Consider location/radiation/quality/duration/timing/severity/associated sxs/prior Treatment) Patient is a 24 y.o. female presenting with back pain.  Back Pain Associated symptoms: no abdominal pain and no chest pain    patient is a 24 year old female presenting with lower back pain across her lumbar spine. Patient states it started 3 days ago. Patient was lifting heavy boxes work. Patient has no numbness tingling in bilateral lower extremity. Patient has no weakness in bilateral lower extremity. No evidence of external trauma. Patient denies any urinary symptoms.  Past Medical History  Diagnosis Date  . Deficiency of potassium   . Migraine   . Hypokalemia   . Back pain    History reviewed. No pertinent past surgical history. Family History  Problem Relation Age of Onset  . COPD Mother    History  Substance Use Topics  . Smoking status: Former Smoker -- 0.00 packs/day  . Smokeless tobacco: Never Used  . Alcohol Use: No   OB History    No data available     Review of Systems  Constitutional: Negative for activity change.  Respiratory: Negative for shortness of breath.   Cardiovascular: Negative for chest pain.  Gastrointestinal: Negative for abdominal pain.  Musculoskeletal: Positive for back pain.      Allergies  Review of patient's allergies indicates no known allergies.  Home Medications   Prior to Admission medications   Medication Sig Start Date End Date Taking? Authorizing Provider  cephALEXin (KEFLEX) 250 MG capsule Take 1 capsule (250 mg total) by mouth 3 (three) times daily. 04/01/15   Waldon Merl, PA-C  norgestimate-ethinyl estradiol (ORTHO-CYCLEN,SPRINTEC,PREVIFEM) 0.25-35 MG-MCG tablet Take 1 tablet by mouth daily. 06/20/14   Sheliah Hatch, MD  traZODone (DESYREL) 50  MG tablet Take 0.5-1 tablets (25-50 mg total) by mouth at bedtime as needed for sleep. 06/20/14   Sheliah Hatch, MD   BP 109/57 mmHg  Pulse 97  Temp(Src) 98.7 F (37.1 C) (Oral)  Resp 18  Ht  (1.549 m)  Wt 102 lb (46.267 kg)  BMI 19.28 kg/m2  SpO2 100%  LMP 03/14/2015 Physical Exam  Constitutional: She is oriented to person, place, and time. She appears well-developed and well-nourished.  HENT:  Head: Normocephalic and atraumatic.  Eyes: Right eye exhibits no discharge.  Cardiovascular: Normal rate, regular rhythm and normal heart sounds.   No murmur heard. Pulmonary/Chest: Effort normal and breath sounds normal. She has no wheezes. She has no rales.  Abdominal: Soft. She exhibits no distension. There is no tenderness.  Neurological: She is oriented to person, place, and time. No cranial nerve deficit. Coordination normal.  No numbness or tingling. Normal sensation bilateral LE Normal strength bilateral lower extremity. Gait is steady.  Skin: Skin is warm and dry. She is not diaphoretic.  Psychiatric: She has a normal mood and affect.  Nursing note and vitals reviewed.   ED Course  Procedures (including critical care time) Labs Review Labs Reviewed  URINALYSIS, ROUTINE W REFLEX MICROSCOPIC (NOT AT [facility]) - Abnormal; Notable for the following:    APPearance CLOUDY (*)    Leukocytes, UA TRACE (*)    All other components within normal limits  PREGNANCY, URINE  URINE MICROSCOPIC-ADD ON    Imaging Review No results found.   EKG Interpretation None  MDM   Final diagnoses:  None    Patient is 24 year old female with bilateral lower lumbar back pain. Started 3 days ago. No red flags. We will check urine and pregnancy. Plan to discharge with ibuprofen and cyclobenzaprine.    Torii Royse Randall An, MD 04/11/15 1744

## 2015-04-11 NOTE — Discharge Instructions (Signed)

## 2015-04-11 NOTE — ED Notes (Signed)
C/o lower back pain-started after dancing last night

## 2015-04-15 ENCOUNTER — Ambulatory Visit (INDEPENDENT_AMBULATORY_CARE_PROVIDER_SITE_OTHER): Payer: 59 | Admitting: Physician Assistant

## 2015-04-15 ENCOUNTER — Encounter: Payer: Self-pay | Admitting: Physician Assistant

## 2015-04-15 VITALS — BP 98/50 | HR 83 | Temp 98.3°F | Ht 60.5 in | Wt 102.2 lb

## 2015-04-15 DIAGNOSIS — G43009 Migraine without aura, not intractable, without status migrainosus: Secondary | ICD-10-CM | POA: Diagnosis not present

## 2015-04-15 MED ORDER — RIZATRIPTAN BENZOATE 10 MG PO TABS: 10.0000 mg | ORAL_TABLET | ORAL | Status: DC | PRN

## 2015-04-15 MED ORDER — KETOROLAC TROMETHAMINE 60 MG/2ML IM SOLN
60.0000 mg | Freq: Once | INTRAMUSCULAR | Status: AC
  Administered 2015-04-15: 60 mg via INTRAMUSCULAR

## 2015-04-15 NOTE — Progress Notes (Signed)
Pre visit review using our clinic review tool, if applicable. No additional management support is needed unless otherwise documented below in the visit note. 

## 2015-04-15 NOTE — Patient Instructions (Signed)
The Toradol given will help get rid of the acute migraine. Continue Maxalt as directed, if needed for recurrence of migraine. I want you to see an Eye Doctor for eye examination. Also, schedule a follow-up with Dr. Beverely Low to discuss preventive medications for migraine therapy.  Folllow-up sooner if symptoms recur and are not resolving with Maxalt.

## 2015-04-15 NOTE — Progress Notes (Signed)
Patient presents to clinic today c/o intermittent migraines over the past 4-5 days with current migraine lasting > 12 hours. Denies nausea or vomiting but notes photophobia, phonophobia and aura. Denies AMS or LOC. Denies change to diet. Endorses staying well hydrated. Is exposed to loud noises daily at work which she feels is worsening migraines.  Past Medical History  Diagnosis Date  . Deficiency of potassium   . Migraine   . Hypokalemia   . Back pain     Current Outpatient Prescriptions on File Prior to Visit  Medication Sig Dispense Refill  . cyclobenzaprine (FLEXERIL) 10 MG tablet Take 1 tablet (10 mg total) by mouth 2 (two) times daily as needed for muscle spasms. 10 tablet 0  . ibuprofen (ADVIL,MOTRIN) 800 MG tablet Take 1 tablet (800 mg total) by mouth 3 (three) times daily. 21 tablet 0  . norgestimate-ethinyl estradiol (ORTHO-CYCLEN,SPRINTEC,PREVIFEM) 0.25-35 MG-MCG tablet Take 1 tablet by mouth daily. 1 Package 11  . traZODone (DESYREL) 50 MG tablet Take 0.5-1 tablets (25-50 mg total) by mouth at bedtime as needed for sleep. 30 tablet 3   No current facility-administered medications on file prior to visit.    No Known Allergies  Family History  Problem Relation Age of Onset  . COPD Mother     History   Social History  . Marital Status: Single    Spouse Name: N/A  . Number of Children: N/A  . Years of Education: N/A   Social History Main Topics  . Smoking status: Former Smoker -- 0.00 packs/day  . Smokeless tobacco: Never Used  . Alcohol Use: No  . Drug Use: No  . Sexual Activity: Yes    Birth Control/ Protection: Pill   Other Topics Concern  . None   Social History Narrative   Review of Systems - See HPI.  All other ROS are negative.  BP 98/50 mmHg  Pulse 83  Temp(Src) 98.3 F (36.8 C) (Oral)  Ht 5' 0.5" (1.537 m)  Wt 102 lb 3.2 oz (46.358 kg)  BMI 19.62 kg/m2  SpO2 95%  LMP 03/14/2015  Physical Exam  Constitutional: She is oriented to  person, place, and time and well-developed, well-nourished, and in no distress.  HENT:  Head: Normocephalic and atraumatic.  Right Ear: External ear normal.  Left Ear: External ear normal.  Nose: Nose normal.  Mouth/Throat: Oropharynx is clear and moist. No oropharyngeal exudate.  TM within normal limits bilaterally.  Eyes: Conjunctivae and EOM are normal. Pupils are equal, round, and reactive to light.  Neck: Neck supple.  Cardiovascular: Normal rate, regular rhythm, normal heart sounds and intact distal pulses.   Pulmonary/Chest: Effort normal and breath sounds normal. No respiratory distress. She has no wheezes. She has no rales. She exhibits no tenderness.  Neurological: She is alert and oriented to person, place, and time. No cranial nerve deficit.  Skin: Skin is warm and dry. No rash noted.  Psychiatric: Affect normal.  Vitals reviewed.   Recent Results (from the past 2160 hour(s))  Pregnancy, urine     Status: None   Collection Time: 03/04/15  7:15 PM  Result Value Ref Range   Preg Test, Ur NEGATIVE NEGATIVE    Comment:        THE SENSITIVITY OF THIS METHODOLOGY IS >20 mIU/mL.   Urinalysis, Routine w reflex microscopic (not at [facility])     Status: Abnormal   Collection Time: 04/11/15  5:15 PM  Result Value Ref Range   Color, Urine YELLOW YELLOW  APPearance CLOUDY (A) CLEAR   Specific Gravity, Urine 1.016 1.005 - 1.030   pH 6.0 5.0 - 8.0   Glucose, UA NEGATIVE NEGATIVE mg/dL   Hgb urine dipstick NEGATIVE NEGATIVE   Bilirubin Urine NEGATIVE NEGATIVE   Ketones, ur NEGATIVE NEGATIVE mg/dL   Protein, ur NEGATIVE NEGATIVE mg/dL   Urobilinogen, UA 1.0 0.0 - 1.0 mg/dL   Nitrite NEGATIVE NEGATIVE   Leukocytes, UA TRACE (A) NEGATIVE  Pregnancy, urine     Status: None   Collection Time: 04/11/15  5:15 PM  Result Value Ref Range   Preg Test, Ur NEGATIVE NEGATIVE    Comment:        THE SENSITIVITY OF THIS METHODOLOGY IS >20 mIU/mL.   Urine microscopic-add on     Status: None    Collection Time: 04/11/15  5:15 PM  Result Value Ref Range   Squamous Epithelial / LPF RARE RARE   WBC, UA 0-2 <3 WBC/hpf   Bacteria, UA RARE RARE    Assessment/Plan: Migraine 60 IM toradol given to abort migraine. Discussed ongoing use of Maxalt as well as diet/hydration to help with migraines. Follow-up with PCP to discuss prophylactic treatment.

## 2015-04-15 NOTE — Assessment & Plan Note (Signed)
60 IM toradol given to abort migraine. Discussed ongoing use of Maxalt as well as diet/hydration to help with migraines. Follow-up with PCP to discuss prophylactic treatment.

## 2015-04-15 NOTE — Addendum Note (Signed)
Addended by: Verdie Shire on: 04/15/2015 04:42 PM   Modules accepted: Orders

## 2015-04-16 ENCOUNTER — Ambulatory Visit: Payer: 59 | Admitting: Family Medicine

## 2015-04-23 ENCOUNTER — Telehealth: Payer: Self-pay | Admitting: Family Medicine

## 2015-04-23 NOTE — Telephone Encounter (Signed)
Yes- pt needs a charge 

## 2015-04-23 NOTE — Telephone Encounter (Signed)
Pt was no show 04/16/15 8:45am, follow up appt for migraines, pt called 7/27 11:06 am stating she slept thru alarm, pt rescheduled for 05/15/15, charge for no show?

## 2015-04-24 ENCOUNTER — Emergency Department (HOSPITAL_BASED_OUTPATIENT_CLINIC_OR_DEPARTMENT_OTHER)
Admission: EM | Admit: 2015-04-24 | Discharge: 2015-04-24 | Disposition: A | Discharge status: Home / Self Care | Payer: 59 | Attending: Emergency Medicine | Admitting: Emergency Medicine

## 2015-04-24 ENCOUNTER — Encounter (HOSPITAL_BASED_OUTPATIENT_CLINIC_OR_DEPARTMENT_OTHER): Payer: Self-pay | Admitting: *Deleted

## 2015-04-24 DIAGNOSIS — Z8639 Personal history of other endocrine, nutritional and metabolic disease: Secondary | ICD-10-CM | POA: Diagnosis not present

## 2015-04-24 DIAGNOSIS — R1030 Lower abdominal pain, unspecified: Secondary | ICD-10-CM | POA: Diagnosis present

## 2015-04-24 DIAGNOSIS — R102 Pelvic and perineal pain: Secondary | ICD-10-CM

## 2015-04-24 DIAGNOSIS — Z3202 Encounter for pregnancy test, result negative: Secondary | ICD-10-CM | POA: Insufficient documentation

## 2015-04-24 DIAGNOSIS — Z79899 Other long term (current) drug therapy: Secondary | ICD-10-CM | POA: Diagnosis not present

## 2015-04-24 DIAGNOSIS — G43909 Migraine, unspecified, not intractable, without status migrainosus: Secondary | ICD-10-CM | POA: Insufficient documentation

## 2015-04-24 DIAGNOSIS — R110 Nausea: Secondary | ICD-10-CM | POA: Diagnosis not present

## 2015-04-24 DIAGNOSIS — R630 Anorexia: Secondary | ICD-10-CM | POA: Insufficient documentation

## 2015-04-24 LAB — URINALYSIS, ROUTINE W REFLEX MICROSCOPIC
Bilirubin Urine: NEGATIVE
GLUCOSE, UA: NEGATIVE mg/dL
HGB URINE DIPSTICK: NEGATIVE
KETONES UR: NEGATIVE mg/dL
LEUKOCYTES UA: NEGATIVE
NITRITE: NEGATIVE
PROTEIN: NEGATIVE mg/dL
SPECIFIC GRAVITY, URINE: 1.018 (ref 1.005–1.030)
Urobilinogen, UA: 0.2 mg/dL (ref 0.0–1.0)
pH: 5 (ref 5.0–8.0)

## 2015-04-24 LAB — COMPREHENSIVE METABOLIC PANEL
ALBUMIN: 3.8 g/dL (ref 3.5–5.0)
ALT: 10 U/L — AB (ref 14–54)
ANION GAP: 6 (ref 5–15)
AST: 18 U/L (ref 15–41)
Alkaline Phosphatase: 53 U/L (ref 38–126)
BUN: 7 mg/dL (ref 6–20)
CHLORIDE: 107 mmol/L (ref 101–111)
CO2: 25 mmol/L (ref 22–32)
CREATININE: 0.65 mg/dL (ref 0.44–1.00)
Calcium: 9 mg/dL (ref 8.9–10.3)
GFR calc Af Amer: >60 mL/min (ref >60)
GLUCOSE: 97 mg/dL (ref 65–99)
POTASSIUM: 4.4 mmol/L (ref 3.5–5.1)
SODIUM: 138 mmol/L (ref 135–145)
TOTAL PROTEIN: 7.2 g/dL (ref 6.5–8.1)
Total Bilirubin: 0.4 mg/dL (ref 0.3–1.2)

## 2015-04-24 LAB — WET PREP, GENITAL: Trich, Wet Prep: NONE SEEN

## 2015-04-24 LAB — CBC WITH DIFFERENTIAL/PLATELET
Basophils Absolute: 0 10*3/uL (ref 0.0–0.1)
Basophils Relative: 0 % (ref 0–1)
Eosinophils Absolute: 0.1 10*3/uL (ref 0.0–0.7)
Eosinophils Relative: 1 % (ref 0–5)
HCT: 36.6 % (ref 36.0–46.0)
Hemoglobin: 11.4 g/dL — ABNORMAL LOW (ref 12.0–15.0)
LYMPHS ABS: 1.7 10*3/uL (ref 0.7–4.0)
LYMPHS PCT: 19 % (ref 12–46)
MCH: 26.1 pg (ref 26.0–34.0)
MCHC: 31.1 g/dL (ref 30.0–36.0)
MCV: 83.8 fL (ref 78.0–100.0)
MONO ABS: 0.8 10*3/uL (ref 0.1–1.0)
MONOS PCT: 9 % (ref 3–12)
Neutro Abs: 6.6 10*3/uL (ref 1.7–7.7)
Neutrophils Relative %: 71 % (ref 43–77)
PLATELETS: 290 10*3/uL (ref 150–400)
RBC: 4.37 MIL/uL (ref 3.87–5.11)
RDW: 15 % (ref 11.5–15.5)
WBC: 9.2 10*3/uL (ref 4.0–10.5)

## 2015-04-24 LAB — PREGNANCY, URINE: Preg Test, Ur: NEGATIVE

## 2015-04-24 MED ORDER — LIDOCAINE HCL (PF) 1 % IJ SOLN
INTRAMUSCULAR | Status: AC
  Administered 2015-04-24: 2.1 mL
  Filled 2015-04-24: qty 5

## 2015-04-24 MED ORDER — CEFTRIAXONE SODIUM 250 MG IJ SOLR
250.0000 mg | Freq: Once | INTRAMUSCULAR | Status: AC
  Administered 2015-04-24: 250 mg via INTRAMUSCULAR
  Filled 2015-04-24: qty 250

## 2015-04-24 MED ORDER — AZITHROMYCIN 250 MG PO TABS
1000.0000 mg | ORAL_TABLET | Freq: Once | ORAL | Status: AC
  Administered 2015-04-24: 1000 mg via ORAL
  Filled 2015-04-24: qty 4

## 2015-04-24 NOTE — ED Notes (Signed)
Pelvic set up at this time.

## 2015-04-24 NOTE — Discharge Instructions (Signed)
Pelvic Pain Female pelvic pain can be caused by many different things and start from a variety of places. Pelvic pain refers to pain that is located in the lower half of the abdomen and between your hips. The pain may occur over a short period of time (acute) or may be reoccurring (chronic). The cause of pelvic pain may be related to disorders affecting the female reproductive organs (gynecologic), but it may also be related to the bladder, kidney stones, an intestinal complication, or muscle or skeletal problems. Getting help right away for pelvic pain is important, especially if there has been severe, sharp, or a sudden onset of unusual pain. It is also important to get help right away because some types of pelvic pain can be life threatening.  CAUSES  Below are only some of the causes of pelvic pain. The causes of pelvic pain can be in one of several categories.   Gynecologic.  Pelvic inflammatory disease.  Sexually transmitted infection.  Ovarian cyst or a twisted ovarian ligament (ovarian torsion).  Uterine lining that grows outside the uterus (endometriosis).  Fibroids, cysts, or tumors.  Ovulation.  Pregnancy.  Pregnancy that occurs outside the uterus (ectopic pregnancy).  Miscarriage.  Labor.  Abruption of the placenta or ruptured uterus.  Infection.  Uterine infection (endometritis).  Bladder infection.  Diverticulitis.  Miscarriage related to a uterine infection (septic abortion).  Bladder.  Inflammation of the bladder (cystitis).  Kidney stone(s).  Gastrointestinal.  Constipation.  Diverticulitis.  Neurologic.  Trauma.  Feeling pelvic pain because of mental or emotional causes (psychosomatic).  Cancers of the bowel or pelvis. EVALUATION  Your caregiver will want to take a careful history of your concerns. This includes recent changes in your health, a careful gynecologic history of your periods (menses), and a sexual history. Obtaining your family  history and medical history is also important. Your caregiver may suggest a pelvic exam. A pelvic exam will help identify the location and severity of the pain. It also helps in the evaluation of which organ system may be involved. In order to identify the cause of the pelvic pain and be properly treated, your caregiver may order tests. These tests may include:   A pregnancy test.  Pelvic ultrasonography.  An X-ray exam of the abdomen.  A urinalysis or evaluation of vaginal discharge.  Blood tests. HOME CARE INSTRUCTIONS   Only take over-the-counter or prescription medicines for pain, discomfort, or fever as directed by your caregiver.   Rest as directed by your caregiver.   Eat a balanced diet.   Drink enough fluids to make your urine clear or pale yellow, or as directed.   Avoid sexual intercourse if it causes pain.   Apply warm or cold compresses to the lower abdomen depending on which one helps the pain.   Avoid stressful situations.   Keep a journal of your pelvic pain. Write down when it started, where the pain is located, and if there are things that seem to be associated with the pain, such as food or your menstrual cycle.  Follow up with your caregiver as directed.  SEEK MEDICAL CARE IF:  Your medicine does not help your pain.  You have abnormal vaginal discharge. SEEK IMMEDIATE MEDICAL CARE IF:   You have heavy bleeding from the vagina.   Your pelvic pain increases.   You feel light-headed or faint.   You have chills.   You have pain with urination or blood in your urine.   You have uncontrolled diarrhea   or vomiting.   You have a fever or persistent symptoms for more than 3 days.  You have a fever and your symptoms suddenly get worse.   You are being physically or sexually abused.  MAKE SURE YOU:  Understand these instructions.  Will watch your condition.  Will get help if you are not doing well or get worse. Document Released:  08/03/2004 Document Revised: 01/21/2014 Document Reviewed: 12/27/2011 [employer] Patient Information 2015 [employer]. This information is not intended to replace advice given to you by your health care provider. Make sure you discuss any questions you have with your health care provider.  

## 2015-04-24 NOTE — ED Provider Notes (Signed)
CSN: [id number]     Arrival date & time 04/24/15  1037 History   First MD Initiated Contact with Patient 04/24/15 1110     Chief Complaint  Patient presents with  . Nausea     (Consider location/radiation/quality/duration/timing/severity/associated sxs/prior Treatment) HPI Comments: Patient presents with nausea. She states over the last week she's had some worsening nausea and decreased appetite. She denies any vomiting. She denies any change in her bowel habits. She denies any urinary symptoms. Over the last couple days she started having some pain across her lower abdomen. She denies any vaginal bleeding or discharge. She denies any known fevers. She hasn't taken anything at home for the symptoms. She is sexually active.   Past Medical History  Diagnosis Date  . Deficiency of potassium   . Migraine   . Hypokalemia   . Back pain    History reviewed. No pertinent past surgical history. Family History  Problem Relation Age of Onset  . COPD Mother    History  Substance Use Topics  . Smoking status: Never Smoker   . Smokeless tobacco: Never Used  . Alcohol Use: No   OB History    No data available     Review of Systems  Constitutional: Negative for fever, chills, diaphoresis and fatigue.  HENT: Negative for congestion, rhinorrhea and sneezing.   Eyes: Negative.   Respiratory: Negative for cough, chest tightness and shortness of breath.   Cardiovascular: Negative for chest pain and leg swelling.  Gastrointestinal: Positive for nausea and abdominal pain. Negative for vomiting, diarrhea and blood in stool.  Genitourinary: Negative for frequency, hematuria, flank pain, vaginal bleeding, vaginal discharge and difficulty urinating.  Musculoskeletal: Negative for back pain and arthralgias.  Skin: Negative for rash.  Neurological: Negative for dizziness, speech difficulty, weakness, numbness and headaches.      Allergies  Review of patient's allergies indicates no known  allergies.  Home Medications   Prior to Admission medications   Medication Sig Start Date End Date Taking? Authorizing Provider  cyclobenzaprine (FLEXERIL) 10 MG tablet Take 1 tablet (10 mg total) by mouth 2 (two) times daily as needed for muscle spasms. 04/11/15   Courteney Lyn Mackuen, MD  ibuprofen (ADVIL,MOTRIN) 800 MG tablet Take 1 tablet (800 mg total) by mouth 3 (three) times daily. 04/11/15   Courteney Lyn Mackuen, MD  norgestimate-ethinyl estradiol (ORTHO-CYCLEN,SPRINTEC,PREVIFEM) 0.25-35 MG-MCG tablet Take 1 tablet by mouth daily. 06/20/14   Sheliah Hatch, MD  rizatriptan (MAXALT) 10 MG tablet Take 1 tablet (10 mg total) by mouth as needed for migraine. May repeat in 2 hours if needed 04/15/15   Waldon Merl, PA-C  traZODone (DESYREL) 50 MG tablet Take 0.5-1 tablets (25-50 mg total) by mouth at bedtime as needed for sleep. 06/20/14   Sheliah Hatch, MD   BP 105/73 mmHg  Pulse 60  Temp(Src) 98.4 F (36.9 C) (Oral)  Resp 18  Ht  (1.549 m)  Wt 103 lb 1.6 oz (46.766 kg)  BMI 19.49 kg/m2  SpO2 100%  LMP 03/14/2015 Physical Exam  Constitutional: She is oriented to person, place, and time. She appears well-developed and well-nourished.  HENT:  Head: Normocephalic and atraumatic.  Eyes: Pupils are equal, round, and reactive to light.  Neck: Normal range of motion. Neck supple.  Cardiovascular: Normal rate, regular rhythm and normal heart sounds.   Pulmonary/Chest: Effort normal and breath sounds normal. No respiratory distress. She has no wheezes. She has no rales. She exhibits no tenderness.  Abdominal:  Soft. Bowel sounds are normal. There is tenderness (mild tenderness to the suprapubic area and across the lower abdomen bilaterally). There is no rebound and no guarding.  Genitourinary:  Small amount of white discharge. There some mild tenderness on palpation of the cervix but no definite cervical motion tenderness. There is no adnexal tenderness.  Musculoskeletal:  Normal range of motion. She exhibits no edema.  Lymphadenopathy:    She has no cervical adenopathy.  Neurological: She is alert and oriented to person, place, and time.  Skin: Skin is warm and dry. No rash noted.  Psychiatric: She has a normal mood and affect.    ED Course  Procedures (including critical care time) Labs Review Labs Reviewed  WET PREP, GENITAL - Abnormal; Notable for the following:    Yeast Wet Prep HPF POC FEW (*)    Clue Cells Wet Prep HPF POC FEW (*)    WBC, Wet Prep HPF POC MODERATE (*)    All other components within normal limits  CBC WITH DIFFERENTIAL/PLATELET - Abnormal; Notable for the following:    Hemoglobin 11.4 (*)    All other components within normal limits  COMPREHENSIVE METABOLIC PANEL - Abnormal; Notable for the following:    ALT 10 (*)    All other components within normal limits  URINALYSIS, ROUTINE W REFLEX MICROSCOPIC (NOT AT [facility])  PREGNANCY, URINE  HIV ANTIBODY (ROUTINE TESTING)  RPR  GC/CHLAMYDIA PROBE AMP ([place]) NOT AT [facility]    Imaging Review No results found.   EKG Interpretation None      MDM   Final diagnoses:  Pelvic pain in female    Patient presents with some lower abdominal pain and nausea. She denies any for any medications for symptom relief. She has a small amount of vaginal discharge but no overt signs of PID. Her wet prep showed white cells. She had a few clue cells and few yeast but I don't feel that her exam is consistent with that. She was treated here with Rocephin and Zithromax. She doesn't have a specific pain over her appendix. Her labs are unremarkable. She was discharged home in good condition. She was encouraged to follow-up with her primary care provider if her symptoms are not improving. She was advised to return here if she has any worsening pain vomiting or fevers. She did not want any nausea medicine for use at home.    Rolan Bucco, MD 04/24/15 [phone]

## 2015-04-24 NOTE — ED Notes (Signed)
patietn c/o nausea for the past week, able to eat but nausea after eating/intermittent abd pain

## 2015-04-25 LAB — GC/CHLAMYDIA PROBE AMP (~~LOC~~) NOT AT ARMC
Chlamydia: NEGATIVE
NEISSERIA GONORRHEA: NEGATIVE

## 2015-04-25 LAB — RPR: RPR Ser Ql: NONREACTIVE

## 2015-04-25 LAB — HIV ANTIBODY (ROUTINE TESTING W REFLEX): HIV SCREEN 4TH GENERATION: NONREACTIVE

## 2015-05-13 ENCOUNTER — Emergency Department (HOSPITAL_BASED_OUTPATIENT_CLINIC_OR_DEPARTMENT_OTHER)
Admission: EM | Admit: 2015-05-13 | Discharge: 2015-05-13 | Disposition: A | Discharge status: Home / Self Care | Payer: 59 | Attending: Emergency Medicine | Admitting: Emergency Medicine

## 2015-05-13 ENCOUNTER — Encounter (HOSPITAL_BASED_OUTPATIENT_CLINIC_OR_DEPARTMENT_OTHER): Payer: Self-pay

## 2015-05-13 ENCOUNTER — Emergency Department (HOSPITAL_BASED_OUTPATIENT_CLINIC_OR_DEPARTMENT_OTHER): Payer: 59

## 2015-05-13 DIAGNOSIS — R112 Nausea with vomiting, unspecified: Secondary | ICD-10-CM | POA: Diagnosis not present

## 2015-05-13 DIAGNOSIS — Z3202 Encounter for pregnancy test, result negative: Secondary | ICD-10-CM | POA: Diagnosis not present

## 2015-05-13 DIAGNOSIS — Z79899 Other long term (current) drug therapy: Secondary | ICD-10-CM | POA: Insufficient documentation

## 2015-05-13 DIAGNOSIS — R1030 Lower abdominal pain, unspecified: Secondary | ICD-10-CM | POA: Diagnosis present

## 2015-05-13 DIAGNOSIS — Z8679 Personal history of other diseases of the circulatory system: Secondary | ICD-10-CM | POA: Diagnosis not present

## 2015-05-13 DIAGNOSIS — R102 Pelvic and perineal pain: Secondary | ICD-10-CM

## 2015-05-13 DIAGNOSIS — Z8639 Personal history of other endocrine, nutritional and metabolic disease: Secondary | ICD-10-CM | POA: Diagnosis not present

## 2015-05-13 LAB — URINALYSIS, ROUTINE W REFLEX MICROSCOPIC
Bilirubin Urine: NEGATIVE
Glucose, UA: NEGATIVE mg/dL
Ketones, ur: NEGATIVE mg/dL
NITRITE: NEGATIVE
Protein, ur: NEGATIVE mg/dL
SPECIFIC GRAVITY, URINE: 1.017 (ref 1.005–1.030)
Urobilinogen, UA: 1 mg/dL (ref 0.0–1.0)
pH: 7 (ref 5.0–8.0)

## 2015-05-13 LAB — URINE MICROSCOPIC-ADD ON

## 2015-05-13 LAB — PREGNANCY, URINE: Preg Test, Ur: NEGATIVE

## 2015-05-13 NOTE — Discharge Instructions (Signed)
Abdominal Pain, Women °Abdominal (stomach, pelvic, or belly) pain can be caused by many things. It is important to tell your doctor: °· The location of the pain. °· Does it come and go or is it present all the time? °· Are there things that start the pain (eating certain foods, exercise)? °· Are there other symptoms associated with the pain (fever, nausea, vomiting, diarrhea)? °All of this is helpful to know when trying to find the cause of the pain. °CAUSES  °· Stomach: virus or bacteria infection, or ulcer. °· Intestine: appendicitis (inflamed appendix), regional ileitis (Crohn's disease), ulcerative colitis (inflamed colon), irritable bowel syndrome, diverticulitis (inflamed diverticulum of the colon), or cancer of the stomach or intestine. °· Gallbladder disease or stones in the gallbladder. °· Kidney disease, kidney stones, or infection. °· Pancreas infection or cancer. °· Fibromyalgia (pain disorder). °· Diseases of the female organs: °¨ Uterus: fibroid (non-cancerous) tumors or infection. °¨ Fallopian tubes: infection or tubal pregnancy. °¨ Ovary: cysts or tumors. °¨ Pelvic adhesions (scar tissue). °¨ Endometriosis (uterus lining tissue growing in the pelvis and on the pelvic organs). °¨ Pelvic congestion syndrome (female organs filling up with blood just before the menstrual period). °¨ Pain with the menstrual period. °¨ Pain with ovulation (producing an egg). °¨ Pain with an IUD (intrauterine device, birth control) in the uterus. °¨ Cancer of the female organs. °· Functional pain (pain not caused by a disease, may improve without treatment). °· Psychological pain. °· Depression. °DIAGNOSIS  °Your doctor will decide the seriousness of your pain by doing an examination. °· Blood tests. °· X-rays. °· Ultrasound. °· CT scan (computed tomography, special type of X-ray). °· MRI (magnetic resonance imaging). °· Cultures, for infection. °· Barium enema (dye inserted in the large intestine, to better view it with  X-rays). °· Colonoscopy (looking in intestine with a lighted tube). °· Laparoscopy (minor surgery, looking in abdomen with a lighted tube). °· Major abdominal exploratory surgery (looking in abdomen with a large incision). °TREATMENT  °The treatment will depend on the cause of the pain.  °· Many cases can be observed and treated at home. °· Over-the-counter medicines recommended by your caregiver. °· Prescription medicine. °· Antibiotics, for infection. °· Birth control pills, for painful periods or for ovulation pain. °· Hormone treatment, for endometriosis. °· Nerve blocking injections. °· Physical therapy. °· Antidepressants. °· Counseling with a psychologist or psychiatrist. °· Minor or major surgery. °HOME CARE INSTRUCTIONS  °· Do not take laxatives, unless directed by your caregiver. °· Take over-the-counter pain medicine only if ordered by your caregiver. Do not take aspirin because it can cause an upset stomach or bleeding. °· Try a clear liquid diet (broth or water) as ordered by your caregiver. Slowly move to a bland diet, as tolerated, if the pain is related to the stomach or intestine. °· Have a thermometer and take your temperature several times a day, and record it. °· Bed rest and sleep, if it helps the pain. °· Avoid sexual intercourse, if it causes pain. °· Avoid stressful situations. °· Keep your follow-up appointments and tests, as your caregiver orders. °· If the pain does not go away with medicine or surgery, you may try: °¨ Acupuncture. °¨ Relaxation exercises (yoga, meditation). °¨ Group therapy. °¨ Counseling. °SEEK MEDICAL CARE IF:  °· You notice certain foods cause stomach pain. °· Your home care treatment is not helping your pain. °· You need stronger pain medicine. °· You want your IUD removed. °· You feel faint or   lightheaded. °· You develop nausea and vomiting. °· You develop a rash. °· You are having side effects or an allergy to your medicine. °SEEK IMMEDIATE MEDICAL CARE IF:  °· Your  pain does not go away or gets worse. °· You have a fever. °· Your pain is felt only in portions of the abdomen. The right side could possibly be appendicitis. The left lower portion of the abdomen could be colitis or diverticulitis. °· You are passing blood in your stools (bright red or black tarry stools, with or without vomiting). °· You have blood in your urine. °· You develop chills, with or without a fever. °· You pass out. °MAKE SURE YOU:  °· Understand these instructions. °· Will watch your condition. °· Will get help right away if you are not doing well or get worse. °Document Released: 07/04/2007 Document Revised: 01/21/2014 Document Reviewed: 07/24/2009 °ExitCare® Patient Information ©2015 [employer]. This information is not intended to replace advice given to you by your health care provider. Make sure you discuss any questions you have with your health care provider. ° °

## 2015-05-13 NOTE — ED Notes (Signed)
Abd pain x 2 weeks

## 2015-05-13 NOTE — ED Provider Notes (Signed)
CSN: [id number]     Arrival date & time 05/13/15  1301 History   First MD Initiated Contact with Patient 05/13/15 1318     Chief Complaint  Patient presents with  . Abdominal Pain     (Consider location/radiation/quality/duration/timing/severity/associated sxs/prior Treatment) Patient is a 24 y.o. female presenting with abdominal pain.  Abdominal Pain Pain location:  Suprapubic Pain quality: sharp   Pain radiates to:  Does not radiate Pain severity:  Moderate Onset quality:  Gradual Duration:  3 days Timing:  Intermittent Progression:  Worsening Chronicity:  Recurrent Context comment:  Seen 2 weeks ago for same with negative STI wu.   Relieved by:  Nothing Worsened by:  Nothing tried Associated symptoms: nausea and vomiting   Associated symptoms: no chest pain, no chills, no constipation, no diarrhea, no dysuria, no fever and no hematuria     Past Medical History  Diagnosis Date  . Deficiency of potassium   . Migraine   . Hypokalemia   . Back pain    History reviewed. No pertinent past surgical history. Family History  Problem Relation Age of Onset  . COPD Mother    Social History  Substance Use Topics  . Smoking status: Never Smoker   . Smokeless tobacco: Never Used  . Alcohol Use: No   OB History    No data available     Review of Systems  Constitutional: Negative for fever and chills.  Cardiovascular: Negative for chest pain.  Gastrointestinal: Positive for nausea, vomiting and abdominal pain. Negative for diarrhea and constipation.  Genitourinary: Negative for dysuria and hematuria.  All other systems reviewed and are negative.     Allergies  Review of patient's allergies indicates no known allergies.  Home Medications   Prior to Admission medications   Medication Sig Start Date End Date Taking? Authorizing Provider  norgestimate-ethinyl estradiol (ORTHO-CYCLEN,SPRINTEC,PREVIFEM) 0.25-35 MG-MCG tablet Take 1 tablet by mouth daily. 06/20/14    Sheliah Hatch, MD  traZODone (DESYREL) 50 MG tablet Take 0.5-1 tablets (25-50 mg total) by mouth at bedtime as needed for sleep. 06/20/14   Sheliah Hatch, MD   BP 96/62 mmHg  Pulse 71  Temp(Src) 98.5 F (36.9 C) (Oral)  Resp 16  Ht 5\' 1"  (1.549 m)  Wt 105 lb (47.628 kg)  BMI 19.85 kg/m2  SpO2 100%  LMP 04/12/2015 Physical Exam  Constitutional: She is oriented to person, place, and time. She appears well-developed and well-nourished.  HENT:  Head: Normocephalic and atraumatic.  Right Ear: External ear normal.  Left Ear: External ear normal.  Eyes: Conjunctivae and EOM are normal. Pupils are equal, round, and reactive to light.  Neck: Normal range of motion. Neck supple.  Cardiovascular: Normal rate, regular rhythm, normal heart sounds and intact distal pulses.   Pulmonary/Chest: Effort normal and breath sounds normal.  Abdominal: Soft. Bowel sounds are normal. There is no tenderness.  Musculoskeletal: Normal range of motion.  Neurological: She is alert and oriented to person, place, and time.  Skin: Skin is warm and dry.  Nursing note and vitals reviewed.   ED Course  Procedures (including critical care time) Labs Review Labs Reviewed  URINALYSIS, ROUTINE W REFLEX MICROSCOPIC (NOT AT [facility]) - Abnormal; Notable for the following:    APPearance CLOUDY (*)    Hgb urine dipstick MODERATE (*)    Leukocytes, UA MODERATE (*)    All other components within normal limits  URINE MICROSCOPIC-ADD ON - Abnormal; Notable for the following:    Squamous Epithelial /  LPF FEW (*)    Bacteria, UA MANY (*)    All other components within normal limits  PREGNANCY, URINE    Imaging Review US Transvaginal Non-ob  05/13/2015   CLINICAL DATA:  Pelvic cramping/ vomiting x3 days  EXAM: TRANSABDOMINAL AND TRANSVAGINAL ULTRASOUND OF PELVIS  DOPPLER ULTRASOUND OF OVARIES  TECHNIQUE: Both transabdominal and transvaginal ultrasound examinations of the pelvis were performed. Transabdominal  technique was performed for global imaging of the pelvis including uterus, ovaries, adnexal regions, and pelvic cul-de-sac.  It was necessary to proceed with endovaginal exam following the transabdominal exam to visualize the bilateral ovaries. Color and duplex Doppler ultrasound was utilized to evaluate blood flow to the ovaries.  COMPARISON:  None.  FINDINGS: Uterus  Measurements: 8.4 x 3.5 x 5.5 cm. No fibroids or other mass visualized.  Endometrium  Thickness: 5 mm.  No focal abnormality visualized.  Right ovary  Measurements: 3.4 x 1.7 x 2.1 cm. Normal appearance/no adnexal mass.  Left ovary  Measurements: 3.1 x 2.1 x 1.9 cm. Normal appearance/no adnexal mass.  Pulsed Doppler evaluation of both ovaries demonstrates normal low-resistance arterial and venous waveforms.  Other findings  Trace pelvic fluid.  IMPRESSION: Negative pelvic ultrasound.  No evidence of ovarian torsion.   Electronically Signed   By: Charline Bills M.D.   On: 05/13/2015 15:12   US Pelvis Complete  05/13/2015   CLINICAL DATA:  Pelvic cramping/ vomiting x3 days  EXAM: TRANSABDOMINAL AND TRANSVAGINAL ULTRASOUND OF PELVIS  DOPPLER ULTRASOUND OF OVARIES  TECHNIQUE: Both transabdominal and transvaginal ultrasound examinations of the pelvis were performed. Transabdominal technique was performed for global imaging of the pelvis including uterus, ovaries, adnexal regions, and pelvic cul-de-sac.  It was necessary to proceed with endovaginal exam following the transabdominal exam to visualize the bilateral ovaries. Color and duplex Doppler ultrasound was utilized to evaluate blood flow to the ovaries.  COMPARISON:  None.  FINDINGS: Uterus  Measurements: 8.4 x 3.5 x 5.5 cm. No fibroids or other mass visualized.  Endometrium  Thickness: 5 mm.  No focal abnormality visualized.  Right ovary  Measurements: 3.4 x 1.7 x 2.1 cm. Normal appearance/no adnexal mass.  Left ovary  Measurements: 3.1 x 2.1 x 1.9 cm. Normal appearance/no adnexal mass.  Pulsed  Doppler evaluation of both ovaries demonstrates normal low-resistance arterial and venous waveforms.  Other findings  Trace pelvic fluid.  IMPRESSION: Negative pelvic ultrasound.  No evidence of ovarian torsion.   Electronically Signed   By: Charline Bills M.D.   On: 05/13/2015 15:12   [place] Art/ven Flow Abd Pelv Doppler  05/13/2015   CLINICAL DATA:  Pelvic cramping/ vomiting x3 days  EXAM: TRANSABDOMINAL AND TRANSVAGINAL ULTRASOUND OF PELVIS  DOPPLER ULTRASOUND OF OVARIES  TECHNIQUE: Both transabdominal and transvaginal ultrasound examinations of the pelvis were performed. Transabdominal technique was performed for global imaging of the pelvis including uterus, ovaries, adnexal regions, and pelvic cul-de-sac.  It was necessary to proceed with endovaginal exam following the transabdominal exam to visualize the bilateral ovaries. Color and duplex Doppler ultrasound was utilized to evaluate blood flow to the ovaries.  COMPARISON:  None.  FINDINGS: Uterus  Measurements: 8.4 x 3.5 x 5.5 cm. No fibroids or other mass visualized.  Endometrium  Thickness: 5 mm.  No focal abnormality visualized.  Right ovary  Measurements: 3.4 x 1.7 x 2.1 cm. Normal appearance/no adnexal mass.  Left ovary  Measurements: 3.1 x 2.1 x 1.9 cm. Normal appearance/no adnexal mass.  Pulsed Doppler evaluation of both ovaries demonstrates normal  low-resistance arterial and venous waveforms.  Other findings  Trace pelvic fluid.  IMPRESSION: Negative pelvic ultrasound.  No evidence of ovarian torsion.   Electronically Signed   By: Sriyesh  KCharline Bills On: 05/13/2015 15:12   I have personally reviewed and evaluated these images and lab results as part of my medical decision-making.   EKG Interpretation None      MDM   Final diagnoses:  Suprapubic abdominal pain, unspecified laterality    24 y.o. female with pertinent PMH of prior PID presents with recurrent abd pain and vomiting.  Seen here for same recently with negative wu,  including pelvic and gc/chlamydia.  Pain is intermittent, worsened again over last 3 days.  Exam today as above.  Pt refused pelvic exam.  [place] ordered.    US unremarkable.  Pt well appearing in minimal pain.  Doubt PID given clinical scenario, although offered abx which the pt refused.  DC home top fu with womens  I have reviewed all laboratory and imaging studies if ordered as above  1. Suprapubic abdominal pain, unspecified laterality   2. Pelvic pain in female         Mirian Mo, MD 05/14/15 [phone]

## 2015-05-15 ENCOUNTER — Ambulatory Visit (INDEPENDENT_AMBULATORY_CARE_PROVIDER_SITE_OTHER): Payer: 59 | Admitting: Family Medicine

## 2015-05-15 ENCOUNTER — Encounter: Payer: Self-pay | Admitting: Family Medicine

## 2015-05-15 ENCOUNTER — Telehealth: Payer: Self-pay | Admitting: Family Medicine

## 2015-05-15 VITALS — BP 100/68 | HR 103 | Temp 98.0°F | Resp 16 | Ht 61.0 in | Wt 104.4 lb

## 2015-05-15 DIAGNOSIS — G43009 Migraine without aura, not intractable, without status migrainosus: Secondary | ICD-10-CM

## 2015-05-15 MED ORDER — ZOLMITRIPTAN 5 MG PO TABS: 5.0000 mg | ORAL_TABLET | ORAL | Status: DC | PRN

## 2015-05-15 MED ORDER — ELETRIPTAN HYDROBROMIDE 40 MG PO TABS: 40.0000 mg | ORAL_TABLET | ORAL | Status: DC | PRN

## 2015-05-15 MED ORDER — TOPIRAMATE 25 MG PO TABS: ORAL_TABLET | ORAL | Status: DC

## 2015-05-15 NOTE — Telephone Encounter (Signed)
Relpax  1 tab prn migraine, #10, 6 refills

## 2015-05-15 NOTE — Assessment & Plan Note (Signed)
Deteriorated.  Pt is now regularly coming to office or ER for abortive tx.  Imitrex and Maxalt are not working.  Discussed daily prophylaxis w/ pt.  Due to her low BP, will start Topamax rather than beta blocker.  Refer to neuro.  Switch to Zomig as rescue med.  Reviewed supportive care and red flags that should prompt return.  Pt expressed understanding and is in agreement w/ plan.

## 2015-05-15 NOTE — Telephone Encounter (Signed)
New medication filled.

## 2015-05-15 NOTE — Progress Notes (Signed)
Pre visit review using our clinic review tool, if applicable. No additional management support is needed unless otherwise documented below in the visit note. 

## 2015-05-15 NOTE — Progress Notes (Signed)
   Subjective:    Patient ID: Tammy Mejia, female    DOB: 06/15/1991, 24 y.o.   MRN: [id number]  HPI Migraines- ongoing issue for pt.  'i'm getting migraines constantly'.  'they've tried every pill but nothing works'.  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  On OCPs.  Pt has never had neuro eval.  Pt has hx of '9 concussions' since 6th grade.  Pt will have associated ringing in the ears w/ HAs.  Pt reports eyes will 'black out'- 'i can't see'.  Pt does not have current migraine.  + nausea, no vomiting.  No dizziness.  + photo and phonophobia.  Having 2-3 migraines/week on average.  No relief w/ Maxalt.  Previous Imitrex failure.   Review of Systems For ROS see HPI     Objective:   Physical Exam  Constitutional: She is oriented to person, place, and time. She appears well-developed and well-nourished. No distress.  HENT:  Head: Normocephalic and atraumatic.  Eyes: Conjunctivae and EOM are normal. Pupils are equal, round, and reactive to light.  Neck: Normal range of motion. Neck supple.  Neurological: She is alert and oriented to person, place, and time.  Skin: Skin is warm and dry. There is pallor.  Psychiatric: She has a normal mood and affect. Her behavior is normal. Thought content normal.  Vitals reviewed.         Assessment & Plan:

## 2015-05-15 NOTE — Telephone Encounter (Signed)
Caller name: Florene Route Scientist, research (physical sciences))  Relationship to patient:  Can be reached: [phone] Pharmacy:  Reason for call: Says that pt was given a Rx of Zomig which has a co-pay of 180.00. She want to know if pt can take Imitrex or Relpax which both have co-pays of 10.00 instead. Please call back to advise.

## 2015-05-15 NOTE — Patient Instructions (Signed)
Follow up as needed We'll call you with your Neuro appt Start the Topamax as directed Use the Zomig as your rescue medication Drink plenty of fluids Call with any questions or concerns Hang in there!!!

## 2015-05-29 ENCOUNTER — Ambulatory Visit (INDEPENDENT_AMBULATORY_CARE_PROVIDER_SITE_OTHER): Payer: 59 | Admitting: Medical

## 2015-05-29 ENCOUNTER — Encounter: Payer: Self-pay | Admitting: Medical

## 2015-05-29 ENCOUNTER — Telehealth: Payer: Self-pay | Admitting: Family Medicine

## 2015-05-29 VITALS — BP 110/70 | HR 90 | Temp 98.5°F | Ht 61.0 in | Wt 105.8 lb

## 2015-05-29 DIAGNOSIS — R112 Nausea with vomiting, unspecified: Secondary | ICD-10-CM

## 2015-05-29 LAB — POCT URINALYSIS DIPSTICK
Bilirubin, UA: NEGATIVE
Blood, UA: NEGATIVE
Glucose, UA: NEGATIVE
Ketones, UA: NEGATIVE
LEUKOCYTES UA: NEGATIVE
Nitrite, UA: NEGATIVE
PH UA: 6
Spec Grav, UA: <=1.005
UROBILINOGEN UA: 0.2

## 2015-05-29 LAB — POCT URINE PREGNANCY: PREG TEST UR: NEGATIVE

## 2015-05-29 MED ORDER — ONDANSETRON HCL 8 MG PO TABS: 8.0000 mg | ORAL_TABLET | Freq: Three times a day (TID) | ORAL | Status: DC | PRN

## 2015-05-29 NOTE — Telephone Encounter (Signed)
Pt has an appointment scheduled with Esperanza Richters, PA-C today (05/29/15) at 3:00 pm.

## 2015-05-29 NOTE — Telephone Encounter (Signed)
[facility] - Client TELEPHONE ADVICE RECORD [facility]  Patient Name: Tammy Mejia  DOB: 1991/09/20    Initial Comment Caller States she has been vomiting for the last 3 days on/off.    Nurse Assessment  Nurse: Roderic Ovens, RN, Amy Date/Time Lamount Cohen Time): 05/29/2015 9:09:06 AM  Confirm and document reason for call. If symptomatic, describe symptoms. ---CALLER STATES THAT SHE IS VOMITING 2-3 TIMES DAILY AND NAUSEATED. NO ABNORMAL COLORS TO IT. NO FEVER. NO ABD PAIN.  Has the patient traveled out of the country within the last 30 days? ---Not Applicable  Does the patient require triage? ---Yes  Related visit to physician within the last 2 weeks? ---No  Does the PT have any chronic conditions? (i.e. diabetes, asthma, etc.) ---No  Did the patient indicate they were pregnant? ---No   SHE DOES NOT KNOW     Guidelines    Guideline Title Affirmed Question Affirmed Notes  Vomiting Taking any of the following medications: digoxin (Lanoxin), lithium, theophylline, phenytoin (Dilantin)    Final Disposition User   See Physician within 4 Hours (or PCP triage) Roderic Ovens, RN, Amy    Referrals  REFERRED TO PCP OFFICE   Disagree/Comply: Comply

## 2015-05-29 NOTE — Progress Notes (Signed)
Pre visit review using our clinic review tool, if applicable. No additional management support is needed unless otherwise documented below in the visit note. 

## 2015-05-29 NOTE — Patient Instructions (Addendum)
Etiology undetermined. Will rx zofran and advise bland diet. Hydrate well. Zofran rx.   Will get cbc, cmp, amylase and lipase today. h pylori as well.  If signs and symptoms worsen or change let us know. Change of signs/symptoms may direct work up. So update [place]. Any severe signs or symptom then ED evaluation.  Follow up 7 days or as needed

## 2015-05-29 NOTE — Progress Notes (Signed)
Subjective:    Patient ID: Tammy Mejia, female    DOB: 1991-08-22, 24 y.o.   MRN: [id number]  HPI  Pt in nausea and vomiting. She states stared  This  Monday. LMP- August 25th. Was a normal cycle. No diarrhea.   No pain in abdomen after eating. Pt appetite is low. Has had low appetite/not big eater since youth.  No fever, no chills or sweats. No uti type symptoms. No back pain. No report of alcohol use but did not ask directly.  Vomited 7-8 times since Monday. Wed 3-4 times. Today has not vomited but nausea.  Review of Systems  Constitutional: Negative for fever, chills and fatigue.  Respiratory: Negative for cough, choking and wheezing.   Cardiovascular: Negative for chest pain and palpitations.  Gastrointestinal: Positive for nausea and vomiting. Negative for abdominal pain, diarrhea, constipation, blood in stool, abdominal distention, anal bleeding and rectal pain.  Genitourinary: Negative.  Negative for dysuria, urgency, hematuria, difficulty urinating, vaginal pain and pelvic pain.  Musculoskeletal: Negative for back pain.  Skin: Negative for rash.  Neurological: Negative for dizziness, syncope, speech difficulty, weakness, light-headedness and numbness.  Hematological: Negative for adenopathy.  Psychiatric/Behavioral: Negative for confusion and sleep disturbance.    Past Medical History  Diagnosis Date  . Deficiency of potassium   . Migraine   . Hypokalemia   . Back pain     Social History   Social History  . Marital Status: Single    Spouse Name: N/A  . Number of Children: N/A  . Years of Education: N/A   Occupational History  . Not on file.   Social History Main Topics  . Smoking status: Never Smoker   . Smokeless tobacco: Never Used  . Alcohol Use: No  . Drug Use: No  . Sexual Activity: Yes    Birth Control/ Protection: Pill   Other Topics Concern  . Not on file   Social History Narrative    No past surgical history on file.  Family  History  Problem Relation Age of Onset  . COPD Mother     No Known Allergies  Current Outpatient Prescriptions on File Prior to Visit  Medication Sig Dispense Refill  . cyclobenzaprine (FLEXERIL) 10 MG tablet     . eletriptan (RELPAX) 40 MG tablet Take 1 tablet (40 mg total) by mouth as needed for migraine or headache. May repeat in 2 hours if headache persists or recurs. 10 tablet 6  . ibuprofen (ADVIL,MOTRIN) 800 MG tablet     . norgestimate-ethinyl estradiol (ORTHO-CYCLEN,SPRINTEC,PREVIFEM) 0.25-35 MG-MCG tablet Take 1 tablet by mouth daily. 1 Package 11  . topiramate (TOPAMAX) 25 MG tablet Start 1 tab QHS x1 week, then 1 tab twice daily x1 week, then 1 tab qAM and 2 tabs QHS x1 week, and then 2 tabs twice daily 120 tablet 3  . traZODone (DESYREL) 50 MG tablet Take 0.5-1 tablets (25-50 mg total) by mouth at bedtime as needed for sleep. 30 tablet 3  . zolmitriptan (ZOMIG) 5 MG tablet Take 1 tablet (5 mg total) by mouth as needed for migraine. 10 tablet 6   No current facility-administered medications on file prior to visit.    BP 110/70 mmHg  Pulse 90  Temp(Src) 98.5 F (36.9 C) (Oral)  Ht  (1.549 m)  Wt 105 lb 12.8 oz (47.991 kg)  BMI 20.00 kg/m2  SpO2 98%  LMP 04/12/2015       Objective:   Physical Exam  General Appearance- Not in  acute distress.  HEENT Eyes- Scleraeral/Conjuntiva-bilat- Not Yellow. Mouth & Throat- Normal.  Chest and Lung Exam Auscultation: Breath sounds:-Normal. Adventitious sounds:- No Adventitious sounds.  Cardiovascular Auscultation:Rythm - Regular. Heart Sounds -Normal heart sounds.  Abdomen Inspection:-Inspection Normal.  Palpation/Perucssion: Palpation and Percussion of the abdomen reveal- Non Tender through out all quadrants, No Rebound tenderness, No rigidity(Guarding) and No Palpable abdominal masses.  Liver:-Normal.  Spleen:- Normal.   Back- no cva tenderness.      Assessment & Plan:  Etiology undetermined. Will rx  zofran and advise bland diet. Hydrate well. Zofran rx.   Will get cbc, cmp, amylase and lipase today.  If signs and symptoms worsen or change let us know. Change of signs/symptoms may direct work up. So update [place]. Any severe signs or symptom then ED evaluation.  Follow up 7 days or as needed

## 2015-05-30 LAB — COMPREHENSIVE METABOLIC PANEL
ALT: 10 U/L (ref 0–35)
AST: 20 U/L (ref 0–37)
Albumin: 4.1 g/dL (ref 3.5–5.2)
Alkaline Phosphatase: 55 U/L (ref 39–117)
BUN: 6 mg/dL (ref 6–23)
CHLORIDE: 106 meq/L (ref 96–112)
CO2: 27 meq/L (ref 19–32)
CREATININE: 0.77 mg/dL (ref 0.40–1.20)
Calcium: 9.1 mg/dL (ref 8.4–10.5)
GFR: 97.88 mL/min (ref >60.00)
GLUCOSE: 87 mg/dL (ref 70–99)
Potassium: 3.7 mEq/L (ref 3.5–5.1)
SODIUM: 140 meq/L (ref 135–145)
Total Bilirubin: 0.5 mg/dL (ref 0.2–1.2)
Total Protein: 7.5 g/dL (ref 6.0–8.3)

## 2015-05-30 LAB — CBC WITH DIFFERENTIAL/PLATELET
BASOS PCT: 1.1 % (ref 0.0–3.0)
Basophils Absolute: 0.1 10*3/uL (ref 0.0–0.1)
EOS ABS: 0.1 10*3/uL (ref 0.0–0.7)
Eosinophils Relative: 1 % (ref 0.0–5.0)
HCT: 39.8 % (ref 36.0–46.0)
HEMOGLOBIN: 12.9 g/dL (ref 12.0–15.0)
Lymphocytes Relative: 24.2 % (ref 12.0–46.0)
Lymphs Abs: 1.7 10*3/uL (ref 0.7–4.0)
MCHC: 32.4 g/dL (ref 30.0–36.0)
MCV: 83.1 fl (ref 78.0–100.0)
MONO ABS: 0.6 10*3/uL (ref 0.1–1.0)
Monocytes Relative: 8 % (ref 3.0–12.0)
NEUTROS PCT: 65.7 % (ref 43.0–77.0)
Neutro Abs: 4.8 10*3/uL (ref 1.4–7.7)
Platelets: 300 10*3/uL (ref 150.0–400.0)
RBC: 4.8 Mil/uL (ref 3.87–5.11)
RDW: 15.1 % (ref 11.5–15.5)
WBC: 7.2 10*3/uL (ref 4.0–10.5)

## 2015-05-30 LAB — AMYLASE: Amylase: 46 U/L (ref 27–131)

## 2015-05-30 LAB — H. PYLORI ANTIBODY, IGG: H Pylori IgG: NEGATIVE

## 2015-05-30 LAB — LIPASE: Lipase: 15 U/L (ref 11.0–59.0)

## 2015-06-05 ENCOUNTER — Encounter: Payer: Self-pay | Admitting: Family Medicine

## 2015-06-05 ENCOUNTER — Ambulatory Visit (INDEPENDENT_AMBULATORY_CARE_PROVIDER_SITE_OTHER): Payer: 59 | Admitting: Family Medicine

## 2015-06-05 ENCOUNTER — Encounter: Payer: Self-pay | Admitting: General Practice

## 2015-06-05 VITALS — BP 110/74 | HR 90 | Temp 98.2°F | Resp 16 | Wt 103.0 lb

## 2015-06-05 DIAGNOSIS — G43009 Migraine without aura, not intractable, without status migrainosus: Secondary | ICD-10-CM

## 2015-06-05 DIAGNOSIS — K047 Periapical abscess without sinus: Secondary | ICD-10-CM | POA: Insufficient documentation

## 2015-06-05 MED ORDER — ONDANSETRON HCL 4 MG/2ML IJ SOLN
4.0000 mg | Freq: Once | INTRAMUSCULAR | Status: AC
  Administered 2015-06-05: 4 mg via INTRAMUSCULAR

## 2015-06-05 MED ORDER — KETOROLAC TROMETHAMINE 60 MG/2ML IM SOLN
60.0000 mg | Freq: Once | INTRAMUSCULAR | Status: AC
  Administered 2015-06-05: 60 mg via INTRAMUSCULAR

## 2015-06-05 MED ORDER — AMOXICILLIN-POT CLAVULANATE 875-125 MG PO TABS: 1.0000 | ORAL_TABLET | Freq: Two times a day (BID) | ORAL | Status: DC

## 2015-06-05 NOTE — Assessment & Plan Note (Signed)
New.  Pt w/ multiple broken teeth.  No obvious abscess formation but acute, severe pain consistent w/ infxn.  Start abx.  Encouraged her to determine which dental providers are participating with her plan and schedule an appt ASAP.  Pt expressed understanding and is in agreement w/ plan.

## 2015-06-05 NOTE — Patient Instructions (Signed)
Follow up w/ neuro as scheduled Call and find a dental provider- we must fix the site of the infection Drink plenty of fluids Take the Augmentin as directed- take w/ food Call with any questions or concerns Hang in there!!!

## 2015-06-05 NOTE — Progress Notes (Signed)
Pre visit review using our clinic review tool, if applicable. No additional management support is needed unless otherwise documented below in the visit note. 

## 2015-06-05 NOTE — Progress Notes (Signed)
   Subjective:    Patient ID: Mat Carne, female    DOB: 04/23/1991, 24 y.o.   MRN: [id number]  HPI Migraine- recurrent problem, started as a toothache at 4am.  Has tried ibuprofen, oragel, migraine meds- no relief.  Pt has a broken molar that is causing her pain.  Broken for 'years'.  Has not seen a dentist in years.  Has appt w/ Neuro in 10 days.  + nausea.  No vomiting.  Denies sensitivity to light/sound at this time.   Review of Systems For ROS see HPI     Objective:   Physical Exam  Constitutional: She is oriented to person, place, and time. She appears well-developed and well-nourished. No distress.  HENT:  Head: Normocephalic and atraumatic.  TMs WNL No TTP over sinuses Minimal nasal congestion Poor dentition, multiple broken teeth present w/o visible abscess  Eyes: Conjunctivae and EOM are normal. Pupils are equal, round, and reactive to light.  Neck: Normal range of motion. Neck supple.  Cardiovascular: Normal rate, regular rhythm, normal heart sounds and intact distal pulses.   Pulmonary/Chest: Effort normal and breath sounds normal. No respiratory distress. She has no wheezes. She has no rales.  Lymphadenopathy:    She has no cervical adenopathy.  Neurological: She is alert and oriented to person, place, and time. She has normal reflexes. No cranial nerve deficit. Coordination normal.  Psychiatric: She has a normal mood and affect. Her behavior is normal. Judgment and thought content normal.  Vitals reviewed.         Assessment & Plan:

## 2015-06-05 NOTE — Assessment & Plan Note (Signed)
Recurrent problem for pt.  She has appt w/ Neuro upcoming.  No relief w/ home Relpax.  Pt to get Toradol injxn and Zofran injxn in office.  Reviewed supportive care and red flags that should prompt return.  Pt expressed understanding and is in agreement w/ plan.

## 2015-06-10 ENCOUNTER — Encounter (HOSPITAL_BASED_OUTPATIENT_CLINIC_OR_DEPARTMENT_OTHER): Payer: Self-pay | Admitting: Emergency Medicine

## 2015-06-10 ENCOUNTER — Emergency Department (HOSPITAL_BASED_OUTPATIENT_CLINIC_OR_DEPARTMENT_OTHER)
Admission: EM | Admit: 2015-06-10 | Discharge: 2015-06-10 | Discharge status: Against Medical Advice | Payer: 59 | Attending: Emergency Medicine | Admitting: Emergency Medicine

## 2015-06-10 DIAGNOSIS — R590 Localized enlarged lymph nodes: Secondary | ICD-10-CM | POA: Insufficient documentation

## 2015-06-10 DIAGNOSIS — M542 Cervicalgia: Secondary | ICD-10-CM | POA: Insufficient documentation

## 2015-06-10 NOTE — ED Notes (Signed)
Pt states she has a swollen lymph node

## 2015-06-11 DIAGNOSIS — Z0279 Encounter for issue of other medical certificate: Secondary | ICD-10-CM

## 2015-06-12 ENCOUNTER — Telehealth: Payer: Self-pay | Admitting: *Deleted

## 2015-06-12 NOTE — Telephone Encounter (Signed)
Pt dropped off FMLA forms for intermittent leave due to migraines. Forms filled out as much as possible and forwarded to Dr. Beverely Low. JG//CMA

## 2015-06-13 NOTE — Telephone Encounter (Signed)
Completed forms placed up front for pt to pick up (along with a copy for her). Copy sent for scanning. JG/CMA

## 2015-06-16 ENCOUNTER — Ambulatory Visit: Payer: 59 | Admitting: Neurology

## 2015-06-30 ENCOUNTER — Ambulatory Visit (INDEPENDENT_AMBULATORY_CARE_PROVIDER_SITE_OTHER): Payer: 59 | Admitting: Medical

## 2015-06-30 ENCOUNTER — Encounter: Payer: Self-pay | Admitting: Medical

## 2015-06-30 ENCOUNTER — Ambulatory Visit: Payer: 59 | Admitting: Family

## 2015-06-30 ENCOUNTER — Telehealth: Payer: Self-pay | Admitting: Family Medicine

## 2015-06-30 VITALS — BP 90/60 | HR 68 | Temp 97.8°F | Ht 61.0 in | Wt 103.0 lb

## 2015-06-30 DIAGNOSIS — J029 Acute pharyngitis, unspecified: Secondary | ICD-10-CM | POA: Diagnosis not present

## 2015-06-30 DIAGNOSIS — J3489 Other specified disorders of nose and nasal sinuses: Secondary | ICD-10-CM | POA: Diagnosis not present

## 2015-06-30 DIAGNOSIS — R0989 Other specified symptoms and signs involving the circulatory and respiratory systems: Secondary | ICD-10-CM

## 2015-06-30 DIAGNOSIS — R067 Sneezing: Secondary | ICD-10-CM

## 2015-06-30 MED ORDER — FLUTICASONE PROPIONATE 50 MCG/ACT NA SUSP: 2.0000 | Freq: Every day | NASAL | Status: DC

## 2015-06-30 MED ORDER — LORATADINE 10 MG PO TABS
10.0000 mg | ORAL_TABLET | Freq: Every day | ORAL | Status: DC
Start: 2015-06-30 — End: 2016-07-12

## 2015-06-30 MED ORDER — AZITHROMYCIN 250 MG PO TABS: ORAL_TABLET | ORAL | Status: DC

## 2015-06-30 NOTE — Patient Instructions (Addendum)
You have some symptom of uri vs allergies. Will rx flonase nasal spray and claritin.  Your rapid strep test was positive. I want you to start azithromycin.  Follow up 7 days or as needed.

## 2015-06-30 NOTE — Telephone Encounter (Signed)
See message below, received by email  I'm the HR Manager for [employer] and I received a "Certification of Health Care Provider for Employee's Serious Health Condition (FMLA)" for Mat Carne, signed June 12, 2015. I would like to verify, in fact, Neena Rhymes, MD, did complete this form (or your office) and sign the document. I would be happy to scan and send a copy of the document to verify, if that would be helpful.   I appreciate your assistance.  Ardelle Balls - HR Manager, [employer] [phone]

## 2015-06-30 NOTE — Progress Notes (Signed)
Pre visit review using our clinic review tool, if applicable. No additional management support is needed unless otherwise documented below in the visit note. 

## 2015-06-30 NOTE — Progress Notes (Signed)
Subjective:    Patient ID: Tammy Mejia, female    DOB: August 22, 1991, 24 y.o.   MRN: [id number]  HPI   Pt in feeling sick. Started out sneezing, runny nose and st for one week. Pt has pnd in am. Then later in day her throat feels dry.   No fevers, no sweats but some chills. No sinus pressure.  When pt swallows her throat does hurt.  LMP- end of September and was normal.  Pt only tried nyquil.     Review of Systems  Constitutional: Positive for chills. Negative for fever and fatigue.  HENT: Positive for congestion, postnasal drip, rhinorrhea, sneezing and sore throat. Negative for dental problem, ear pain, nosebleeds and sinus pressure.   Eyes: Negative for pain and redness.  Respiratory: Negative for cough, shortness of breath and wheezing.   Cardiovascular: Negative for chest pain and palpitations.  Gastrointestinal: Positive for nausea. Negative for abdominal pain.       Nausea only after lunch yesterday. Some this am after breakfast.  Musculoskeletal: Negative for back pain.  Neurological: Negative for dizziness and headaches.  Hematological: Negative for adenopathy. Does not bruise/bleed easily.  Psychiatric/Behavioral: Negative for behavioral problems and confusion.    Past Medical History  Diagnosis Date  . Deficiency of potassium   . Migraine   . Hypokalemia   . Back pain     Social History   Social History  . Marital Status: Single    Spouse Name: N/A  . Number of Children: N/A  . Years of Education: N/A   Occupational History  . Not on file.   Social History Main Topics  . Smoking status: Never Smoker   . Smokeless tobacco: Never Used  . Alcohol Use: No  . Drug Use: No  . Sexual Activity: Yes    Birth Control/ Protection: Pill   Other Topics Concern  . Not on file   Social History Narrative    No past surgical history on file.  Family History  Problem Relation Age of Onset  . COPD Mother     No Known Allergies  Current  Outpatient Prescriptions on File Prior to Visit  Medication Sig Dispense Refill  . cyclobenzaprine (FLEXERIL) 10 MG tablet     . eletriptan (RELPAX) 40 MG tablet Take 1 tablet (40 mg total) by mouth as needed for migraine or headache. May repeat in 2 hours if headache persists or recurs. 10 tablet 6  . ibuprofen (ADVIL,MOTRIN) 800 MG tablet     . norgestimate-ethinyl estradiol (ORTHO-CYCLEN,SPRINTEC,PREVIFEM) 0.25-35 MG-MCG tablet Take 1 tablet by mouth daily. 1 Package 11  . ondansetron (ZOFRAN) 8 MG tablet Take 1 tablet (8 mg total) by mouth every 8 (eight) hours as needed for nausea or vomiting. 9 tablet 0  . topiramate (TOPAMAX) 25 MG tablet Start 1 tab QHS x1 week, then 1 tab twice daily x1 week, then 1 tab qAM and 2 tabs QHS x1 week, and then 2 tabs twice daily 120 tablet 3  . traZODone (DESYREL) 50 MG tablet Take 0.5-1 tablets (25-50 mg total) by mouth at bedtime as needed for sleep. 30 tablet 3   No current facility-administered medications on file prior to visit.    BP 90/60 mmHg  Pulse 68  Temp(Src) 97.8 F (36.6 C) (Oral)  Ht  (1.549 m)  Wt 103 lb (46.72 kg)  BMI 19.47 kg/m2  SpO2 98%  LMP 06/18/2015       Objective:   Physical Exam  General  Mental Status - Alert. General Appearance - Well groomed. Not in acute distress.  Skin Rashes- No Rashes.  HEENT Head- Normal. Ear Auditory Canal - Left- Normal. Right - Normal.Tympanic Membrane- Left- Normal. Right- Normal. Eye Sclera/Conjunctiva- Left- Normal. Right- Normal. Nose & Sinuses Nasal Mucosa- Left-  Boggy and Congested. Right-  Boggy and  Congested.Bilateral maxillary and frontal sinus pressure. Mouth & Throat Lips: Upper Lip- Normal: no dryness, cracking, pallor, cyanosis, or vesicular eruption. Lower Lip-Normal: no dryness, cracking, pallor, cyanosis or vesicular eruption. Buccal Mucosa- Bilateral- No Aphthous ulcers. Oropharynx- No Discharge or Erythema. Tonsils: Characteristics- Bilateral- mild  Erythema and Congestion. Size/Enlargement- Bilateral- No enlargement. Discharge- bilateral-None.  Neck Neck- Supple. No Masses. Rt submandibular node moderate enlarged and tender.   Chest and Lung Exam Auscultation: Breath Sounds:-Clear even and unlabored.  Cardiovascular Auscultation:Rythm- Regular, rate and rhythm. Murmurs & Other Heart Sounds:Ausculatation of the heart reveal- No Murmurs.  Lymphatic Head & Neck General Head & Neck Lymphatics: Bilateral: Description- see neck exam. Rt submandibular node moderate enlarged.       Assessment & Plan:  You have some symptom of uri vs allergies. Will rx flonase nasal spray and claritin.  Your rapid strep test was positive. I want you to start azithromycin.  Follow up 7 days or as needed.

## 2015-07-01 NOTE — Telephone Encounter (Signed)
Called and left detailed message for Tammy Mejia confirming that Dr. Beverely Low did completed and sign the FMLA forms at question and to please give our office a call back if any other information is needed. JG//CMA

## 2015-07-02 ENCOUNTER — Other Ambulatory Visit: Payer: Self-pay | Admitting: Family Medicine

## 2015-07-03 NOTE — Telephone Encounter (Signed)
Medication filled to pharmacy as requested.   

## 2015-07-14 ENCOUNTER — Encounter (HOSPITAL_BASED_OUTPATIENT_CLINIC_OR_DEPARTMENT_OTHER): Payer: Self-pay | Admitting: *Deleted

## 2015-07-14 ENCOUNTER — Emergency Department (HOSPITAL_BASED_OUTPATIENT_CLINIC_OR_DEPARTMENT_OTHER)
Admission: EM | Admit: 2015-07-14 | Discharge: 2015-07-14 | Disposition: A | Discharge status: Home / Self Care | Payer: 59 | Attending: Emergency Medicine | Admitting: Emergency Medicine

## 2015-07-14 DIAGNOSIS — Z8639 Personal history of other endocrine, nutritional and metabolic disease: Secondary | ICD-10-CM | POA: Diagnosis not present

## 2015-07-14 DIAGNOSIS — G43809 Other migraine, not intractable, without status migrainosus: Secondary | ICD-10-CM | POA: Insufficient documentation

## 2015-07-14 DIAGNOSIS — Z79899 Other long term (current) drug therapy: Secondary | ICD-10-CM | POA: Diagnosis not present

## 2015-07-14 DIAGNOSIS — Z7951 Long term (current) use of inhaled steroids: Secondary | ICD-10-CM | POA: Insufficient documentation

## 2015-07-14 DIAGNOSIS — G43909 Migraine, unspecified, not intractable, without status migrainosus: Secondary | ICD-10-CM | POA: Diagnosis present

## 2015-07-14 MED ORDER — METOCLOPRAMIDE HCL 5 MG/ML IJ SOLN
10.0000 mg | Freq: Once | INTRAMUSCULAR | Status: AC
  Administered 2015-07-14: 10 mg via INTRAVENOUS
  Filled 2015-07-14: qty 2

## 2015-07-14 MED ORDER — DIPHENHYDRAMINE HCL 50 MG/ML IJ SOLN
25.0000 mg | Freq: Once | INTRAMUSCULAR | Status: AC
  Administered 2015-07-14: 25 mg via INTRAVENOUS
  Filled 2015-07-14: qty 1

## 2015-07-14 MED ORDER — KETOROLAC TROMETHAMINE 30 MG/ML IJ SOLN
30.0000 mg | Freq: Once | INTRAMUSCULAR | Status: AC
  Administered 2015-07-14: 30 mg via INTRAVENOUS
  Filled 2015-07-14: qty 1

## 2015-07-14 NOTE — Discharge Instructions (Signed)
Please follow up with your doctor or a specialist for further evaluation of your headaches.    Migraine Headache A migraine headache is an intense, throbbing pain on one or both sides of your head. A migraine can last for 30 minutes to several hours. CAUSES  The exact cause of a migraine headache is not always known. However, a migraine may be caused when nerves in the brain become irritated and release chemicals that cause inflammation. This causes pain. Certain things may also trigger migraines, such as:  Alcohol.  Smoking.  Stress.  Menstruation.  Aged cheeses.  Foods or drinks that contain nitrates, glutamate, aspartame, or tyramine.  Lack of sleep.  Chocolate.  Caffeine.  Hunger.  Physical exertion.  Fatigue.  Medicines used to treat chest pain (nitroglycerine), birth control pills, estrogen, and some blood pressure medicines. SIGNS AND SYMPTOMS  Pain on one or both sides of your head.  Pulsating or throbbing pain.  Severe pain that prevents daily activities.  Pain that is aggravated by any physical activity.  Nausea, vomiting, or both.  Dizziness.  Pain with exposure to bright lights, loud noises, or activity.  General sensitivity to bright lights, loud noises, or smells. Before you get a migraine, you may get warning signs that a migraine is coming (aura). An aura may include:  Seeing flashing lights.  Seeing bright spots, halos, or zigzag lines.  Having tunnel vision or blurred vision.  Having feelings of numbness or tingling.  Having trouble talking.  Having muscle weakness. DIAGNOSIS  A migraine headache is often diagnosed based on:  Symptoms.  Physical exam.  A CT scan or MRI of your head. These imaging tests cannot diagnose migraines, but they can help rule out other causes of headaches. TREATMENT Medicines may be given for pain and nausea. Medicines can also be given to help prevent recurrent migraines.  HOME CARE  INSTRUCTIONS  Only take over-the-counter or prescription medicines for pain or discomfort as directed by your health care provider. The use of long-term narcotics is not recommended.  Lie down in a dark, quiet room when you have a migraine.  Keep a journal to find out what may trigger your migraine headaches. For example, write down:  What you eat and drink.  How much sleep you get.  Any change to your diet or medicines.  Limit alcohol consumption.  Quit smoking if you smoke.  Get 7-9 hours of sleep, or as recommended by your health care provider.  Limit stress.  Keep lights dim if bright lights bother you and make your migraines worse. SEEK IMMEDIATE MEDICAL CARE IF:   Your migraine becomes severe.  You have a fever.  You have a stiff neck.  You have vision loss.  You have muscular weakness or loss of muscle control.  You start losing your balance or have trouble walking.  You feel faint or pass out.  You have severe symptoms that are different from your first symptoms. MAKE SURE YOU:   Understand these instructions.  Will watch your condition.  Will get help right away if you are not doing well or get worse.   This information is not intended to replace advice given to you by your health care provider. Make sure you discuss any questions you have with your health care provider.   Document Released: 09/06/2005 Document Revised: 09/27/2014 Document Reviewed: 05/14/2013 Elsevier Interactive Patient Education [employer]2016 Elsevier Inc.

## 2015-07-14 NOTE — ED Notes (Signed)
Migraine x 2 days. Pale, light sensitive and nausea.
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
## 2015-07-14 NOTE — ED Notes (Signed)
Pt on her phone in waiting area-no distress-headphones in.

## 2015-07-24 ENCOUNTER — Encounter: Payer: Self-pay | Admitting: Family Medicine

## 2015-07-24 ENCOUNTER — Ambulatory Visit (INDEPENDENT_AMBULATORY_CARE_PROVIDER_SITE_OTHER): Payer: 59 | Admitting: Family Medicine

## 2015-07-24 VITALS — BP 118/80 | HR 82 | Temp 98.1°F | Resp 16 | Wt 105.4 lb

## 2015-07-24 DIAGNOSIS — Z309 Encounter for contraceptive management, unspecified: Secondary | ICD-10-CM

## 2015-07-24 DIAGNOSIS — G43009 Migraine without aura, not intractable, without status migrainosus: Secondary | ICD-10-CM | POA: Diagnosis not present

## 2015-07-24 DIAGNOSIS — Z3009 Encounter for other general counseling and advice on contraception: Secondary | ICD-10-CM

## 2015-07-24 LAB — POCT URINE PREGNANCY: PREG TEST UR: NEGATIVE

## 2015-07-24 MED ORDER — ELETRIPTAN HYDROBROMIDE 40 MG PO TABS: 40.0000 mg | ORAL_TABLET | ORAL | Status: DC | PRN

## 2015-07-24 MED ORDER — MEDROXYPROGESTERONE ACETATE 150 MG/ML IM SUSP
150.0000 mg | Freq: Once | INTRAMUSCULAR | Status: AC
  Administered 2015-07-24: 150 mg via INTRAMUSCULAR

## 2015-07-24 MED ORDER — KETOROLAC TROMETHAMINE 30 MG/ML IJ SOLN
30.0000 mg | Freq: Once | INTRAMUSCULAR | Status: AC
  Administered 2015-07-24: 30 mg via INTRAMUSCULAR

## 2015-07-24 NOTE — Assessment & Plan Note (Signed)
New.  Pt stopped OCPs due to lack of sex drive.  Not interested in IUD or implanon.  Negative Upreg here today.  Ok to start Depo.  Stressed possibility of irregular menses, weight gain, and need to use back up contraception until next injxn.  Pt expressed understanding and is in agreement w/ plan.

## 2015-07-24 NOTE — Progress Notes (Signed)
Pre visit review using our clinic review tool, if applicable. No additional management support is needed unless otherwise documented below in the visit note. 

## 2015-07-24 NOTE — Assessment & Plan Note (Signed)
Recurrent problem for pt.  She did not go to Neuro appt despite stressing the need for specialist management.  Pt did not pick up her Relpax prescription.  She is in no distress here and her reported sxs do not match her physical exam- 10/10 pain but smiling and conversing w/o difficulty, reported photophobia but using cell phone w/o difficulty.  She is again asking for work note.  She has been to ER 9x in last year for similar sxs.  Willing to give Toradol today but nothing else as pt has not picked up Triptan medication nor kept her neuro appt.  Will resend Relpax, re-refer to neuro.  If pt doesn't keep appt, she will be dismissed for failure to comply w/ treatment plan.

## 2015-07-24 NOTE — Addendum Note (Signed)
Addended by: Geannie RisenBRODMERKEL, Mariano Doshi L on: 07/24/2015 01:56 PM   Modules accepted: Orders

## 2015-07-24 NOTE — Patient Instructions (Signed)
Follow up as needed Schedule a nurse visit for your next Depo injection between:  PICK UP YOUR RELPAX to take w/ your migraine Drink plenty of water DO NOT take any additional ibuprofen, advil, aleve, excedrin b/c you got the toradol injection today We'll call you with your Neuro appt- you must go to get these headaches under better control Call with any questions or concerns Hang in there!!

## 2015-07-24 NOTE — Progress Notes (Signed)
   Subjective:    Patient ID: Tammy CarneAshley Mejia, female    DOB: 12/30/1990, 24 y.o.   MRN: [id number]  HPI Migraine- pt has been to ER 9x in last year.  Did not go to Neuro appt b/c she could not afford to go the same week she had her tooth pulled.  Current HA started on Tuesday.  Pt did not pick up Relpax prescription.  Taking Topamax.  Has taken 800mg  ibuprofen, advil, excedrin w/o relief.  No nausea.  + photo and phonophobia (but pt was playing on phone when I entered room).  Pain is 10/10 (no visible distress).  Denies sinus pain/pressure.  Birth control counseling- stopped OCPs due to decreased sex drive.  Not interested in IUD, implanon.  Wants to start Depo.  Review of Systems For ROS see HPI     Objective:   Physical Exam  Constitutional: She is oriented to person, place, and time. She appears well-developed and well-nourished. No distress.  No discomfort or distress noted  HENT:  Head: Normocephalic and atraumatic.  TMs WNL No TTP over sinuses Minimal nasal congestion  Eyes: Conjunctivae and EOM are normal. Pupils are equal, round, and reactive to light.  Neck: Normal range of motion. Neck supple.  Cardiovascular: Normal rate, regular rhythm, normal heart sounds and intact distal pulses.   Pulmonary/Chest: Effort normal and breath sounds normal. No respiratory distress. She has no wheezes. She has no rales.  Lymphadenopathy:    She has no cervical adenopathy.  Neurological: She is alert and oriented to person, place, and time. She has normal reflexes. No cranial nerve deficit. Coordination normal.  Psychiatric: She has a normal mood and affect. Her behavior is normal. Judgment and thought content normal.  Vitals reviewed.         Assessment & Plan:

## 2015-07-28 ENCOUNTER — Encounter (HOSPITAL_BASED_OUTPATIENT_CLINIC_OR_DEPARTMENT_OTHER): Payer: Self-pay | Admitting: *Deleted

## 2015-07-28 ENCOUNTER — Emergency Department (HOSPITAL_BASED_OUTPATIENT_CLINIC_OR_DEPARTMENT_OTHER)
Admission: EM | Admit: 2015-07-28 | Discharge: 2015-07-28 | Disposition: A | Discharge status: Home / Self Care | Payer: 59 | Attending: Emergency Medicine | Admitting: Emergency Medicine

## 2015-07-28 DIAGNOSIS — Z7951 Long term (current) use of inhaled steroids: Secondary | ICD-10-CM | POA: Diagnosis not present

## 2015-07-28 DIAGNOSIS — M25511 Pain in right shoulder: Secondary | ICD-10-CM | POA: Insufficient documentation

## 2015-07-28 DIAGNOSIS — G8929 Other chronic pain: Secondary | ICD-10-CM | POA: Diagnosis not present

## 2015-07-28 DIAGNOSIS — Z79899 Other long term (current) drug therapy: Secondary | ICD-10-CM | POA: Insufficient documentation

## 2015-07-28 DIAGNOSIS — Z8639 Personal history of other endocrine, nutritional and metabolic disease: Secondary | ICD-10-CM | POA: Diagnosis not present

## 2015-07-28 DIAGNOSIS — G43909 Migraine, unspecified, not intractable, without status migrainosus: Secondary | ICD-10-CM | POA: Insufficient documentation

## 2015-07-28 MED ORDER — MELOXICAM 7.5 MG PO TABS: 15.0000 mg | ORAL_TABLET | Freq: Every day | ORAL | Status: DC

## 2015-07-28 MED ORDER — HYDROCODONE-ACETAMINOPHEN 5-325 MG PO TABS
2.0000 | ORAL_TABLET | Freq: Once | ORAL | Status: AC
  Administered 2015-07-28: 2 via ORAL
  Filled 2015-07-28: qty 2

## 2015-07-28 MED ORDER — TRAMADOL HCL 50 MG PO TABS: 50.0000 mg | ORAL_TABLET | Freq: Four times a day (QID) | ORAL | Status: DC | PRN

## 2015-07-28 NOTE — Discharge Instructions (Signed)
Cryotherapy °Cryotherapy is when you put ice on your injury. Ice helps lessen pain and puffiness (swelling) after an injury. Ice works the best when you start using it in the first 24 to 48 hours after an injury. °HOME CARE °· Put a dry or damp towel between the ice pack and your skin. °· You may press gently on the ice pack. °· Leave the ice on for no more than 10 to 20 minutes at a time. °· Check your skin after 5 minutes to make sure your skin is okay. °· Rest at least 20 minutes between ice pack uses. °· Stop using ice when your skin loses feeling (numbness). °· Do not use ice on someone who cannot tell you when it hurts. This includes small children and people with memory problems (dementia). °GET HELP RIGHT AWAY IF: °· You have white spots on your skin. °· Your skin turns blue or pale. °· Your skin feels waxy or hard. °· Your puffiness gets worse. °MAKE SURE YOU:  °· Understand these instructions. °· Will watch your condition. °· Will get help right away if you are not doing well or get worse. °  °This information is not intended to replace advice given to you by your health care provider. Make sure you discuss any questions you have with your health care provider. °  °Document Released: 02/23/2008 Document Revised: 11/29/2011 Document Reviewed: 04/29/2011 °Elsevier Interactive Patient Education ©2016 [employer] ° °Heat Therapy °Heat therapy can help ease sore, stiff, injured, and tight muscles and joints. Heat relaxes your muscles, which may help ease your pain. Heat therapy should only be used on old, pre-existing, or long-lasting (chronic) injuries. Do not use heat therapy unless told by your doctor. °HOW TO USE HEAT THERAPY °There are several different kinds of heat therapy, including: °· Moist heat pack. °· Warm water bath. °· Hot water bottle. °· Electric heating pad. °· Heated gel pack. °· Heated wrap. °· Electric heating pad. °GENERAL HEAT THERAPY RECOMMENDATIONS  °· Do not sleep while using heat  therapy. Only use heat therapy while you are awake. °· Your skin may turn pink while using heat therapy. Do not use heat therapy if your skin turns red. °· Do not use heat therapy if you have new pain. °· High heat or long exposure to heat can cause burns. Be careful when using heat therapy to avoid burning your skin. °· Do not use heat therapy on areas of your skin that are already irritated, such as with a rash or sunburn. °GET HELP IF:  °· You have blisters, redness, swelling (puffiness), or numbness. °· You have new pain. °· Your pain is worse. °MAKE SURE YOU: °· Understand these instructions. °· Will watch your condition. °· Will get help right away if you are not doing well or get worse. °  °This information is not intended to replace advice given to you by your health care provider. Make sure you discuss any questions you have with your health care provider. °  °Document Released: 11/29/2011 Document Revised: 09/27/2014 Document Reviewed: 10/30/2013 °Elsevier Interactive Patient Education ©2016 [employer] ° °Shoulder Pain °The shoulder is the joint that connects your arms to your body. The bones that form the shoulder joint include the upper arm bone (humerus), the shoulder blade (scapula), and the collarbone (clavicle). The top of the humerus is shaped like a ball and fits into a rather flat socket on the scapula (glenoid cavity). A combination of muscles and strong, fibrous tissues that   connect muscles to bones (tendons) support your shoulder joint and hold the ball in the socket. Small, fluid-filled sacs (bursae) are located in different areas of the joint. They act as cushions between the bones and the overlying soft tissues and help reduce friction between the gliding tendons and the bone as you move your arm. Your shoulder joint allows a wide range of motion in your arm. This range of motion allows you to do things like scratch your back or throw a ball. However, this range of motion also makes your  shoulder more prone to pain from overuse and injury. °Causes of shoulder pain can originate from both injury and overuse and usually can be grouped in the following four categories: °· Redness, swelling, and pain (inflammation) of the tendon (tendinitis) or the bursae (bursitis). °· Instability, such as a dislocation of the joint. °· Inflammation of the joint (arthritis). °· Broken bone (fracture). °HOME CARE INSTRUCTIONS  °· Apply ice to the sore area. °¨ Put ice in a plastic bag. °¨ Place a towel between your skin and the bag. °¨ Leave the ice on for 15-20 minutes, 3-4 times per day for the first 2 days, or as directed by your health care provider. °· Stop using cold packs if they do not help with the pain. °· If you have a shoulder sling or immobilizer, wear it as long as your caregiver instructs. Only remove it to shower or bathe. Move your arm as little as possible, but keep your hand moving to prevent swelling. °· Squeeze a soft ball or foam pad as much as possible to help prevent swelling. °· Only take over-the-counter or prescription medicines for pain, discomfort, or fever as directed by your caregiver. °SEEK MEDICAL CARE IF:  °· Your shoulder pain increases, or new pain develops in your arm, hand, or fingers. °· Your hand or fingers become cold and numb. °· Your pain is not relieved with medicines. °SEEK IMMEDIATE MEDICAL CARE IF:  °· Your arm, hand, or fingers are numb or tingling. °· Your arm, hand, or fingers are significantly swollen or turn white or blue. °MAKE SURE YOU:  °· Understand these instructions. °· Will watch your condition. °· Will get help right away if you are not doing well or get worse. °  °This information is not intended to replace advice given to you by your health care provider. Make sure you discuss any questions you have with your health care provider. °  °Document Released: 06/16/2005 Document Revised: 09/27/2014 Document Reviewed: 12/30/2014 °Elsevier Interactive Patient  Education ©2016 [employer]vier Inc. ° °

## 2015-07-28 NOTE — ED Provider Notes (Signed)
CSN: [id number]     Arrival date & time 07/28/15  1801 History   First MD Initiated Contact with Patient 07/28/15 1807     Chief Complaint  Patient presents with  . 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  Her pain is felt deep in her shoulder joint without radiation, worse with flexion of arm above her shoulders and worse with internal rotation.  She has taken ibuprofen 600 mg twice a day without any improvement in her pain. She states that the pain is not waking her up at night due to her sleeping medication. She denies any swelling, redness, fever, chills, sweats. She denies any new injury but has a history of frequent shoulder dislocations.  She states she has seen orthopedic surgeon before [facility] orthopedics, has used NSAIDs to treat and had multiple x-rays without any findings. She denies ever having a MRI.   She has no other complaints or concerns at this time  Past Medical History  Diagnosis Date  . Deficiency of potassium   . Migraine   . Hypokalemia   . Back pain    History reviewed. No pertinent past surgical history. Family History  Problem Relation Age of Onset  . COPD Mother    Social History  Substance Use Topics  . Smoking status: Never Smoker   . Smokeless tobacco: Never Used  . Alcohol Use: No   OB History    No data available     Review of Systems  Constitutional: Negative.   HENT: Negative.   Respiratory: Negative.  Negative for cough, chest tightness, shortness of breath and wheezing.   Cardiovascular:  Negative.  Negative for chest pain, palpitations and leg swelling.  Gastrointestinal: Negative.   Musculoskeletal: Positive for arthralgias. Negative for myalgias, back pain, joint swelling, gait problem, neck pain and neck stiffness.  Skin: Negative.  Negative for color change.  Neurological: Negative.  Negative for weakness and numbness.      Allergies  Review of patient's allergies indicates no known allergies.  Home Medications   Prior to Admission medications   Medication Sig Start Date End Date Taking? Authorizing Provider  cyclobenzaprine (FLEXERIL) 10 MG tablet  04/15/15   Historical Provider, MD  eletriptan (RELPAX) 40 MG tablet Take 1 tablet (40 mg total) by mouth as needed for migraine or headache. May repeat in 2 hours if headache persists or recurs. 07/24/15   Sheliah Hatch, MD  fluticasone (FLONASE) 50 MCG/ACT nasal spray Place 2 sprays into both nostrils daily. 06/30/15   Ramon Dredge Saguier, PA-C  ibuprofen (ADVIL,MOTRIN) 800 MG tablet  04/15/15   Historical Provider, MD  loratadine (CLARITIN) 10 MG tablet Take 1 tablet (10 mg total) by mouth daily. 06/30/15   Ramon Dredge Saguier, PA-C  meloxicam (MOBIC) 7.5 MG tablet Take 2 tablets (15 mg total) by mouth daily. 07/28/15   Danelle Berry, PA-C  MONO-LINYAH 0.25-35 MG-MCG tablet TAKE 1 TABLET BY MOUTH DAILY 07/03/15   Sheliah Hatch, MD  ondansetron (ZOFRAN) 8 MG tablet Take 1 tablet (8 mg total) by mouth every 8 (eight) hours as needed  for nausea or vomiting. 05/29/15   Ramon DredgeEdward Saguier, PA-C  topiramate (TOPAMAX) 25 MG tablet Start 1 tab QHS x1 week, then 1 tab twice daily x1 week, then 1 tab qAM and 2 tabs QHS x1 week, and then 2 tabs twice daily 05/15/15   Sheliah HatchKatherine E Tabori, MD  traMADol (ULTRAM) 50 MG tablet Take 1 tablet (50 mg total) by mouth every 6 (six) hours as needed. 07/28/15   Danelle BerryLeisa Benjermin Korber, PA-C  traZODone (DESYREL) 50 MG tablet Take 0.5-1 tablets (25-50 mg total) by mouth at bedtime as needed for sleep. 06/20/14   Sheliah HatchKatherine E  Tabori, MD   BP 104/69 mmHg  Pulse 74  Temp(Src) 98.1 F (36.7 C) (Oral)  Resp 18  Ht 5\' 1"  (1.549 m)  Wt 105 lb (47.628 kg)  BMI 19.85 kg/m2  SpO2 100%  LMP 06/18/2015 Physical Exam  Constitutional: She is oriented to person, place, and time. She appears well-developed and well-nourished. No distress.  HENT:  Head: Normocephalic and atraumatic.  Right Ear: External ear normal.  Left Ear: External ear normal.  Nose: Nose normal.  Mouth/Throat: Oropharynx is clear and moist. No oropharyngeal exudate.  Eyes: Conjunctivae and EOM are normal. Pupils are equal, round, and reactive to light. Right eye exhibits no discharge. Left eye exhibits no discharge. No scleral icterus.  Neck: Normal range of motion. Neck supple. No JVD present. No tracheal deviation present.  Cardiovascular: Normal rate and regular rhythm.   Pulmonary/Chest: Effort normal and breath sounds normal. No stridor. No respiratory distress.  Musculoskeletal: Normal range of motion. She exhibits tenderness. She exhibits no edema.       Right shoulder: She exhibits tenderness. She exhibits normal range of motion, no bony tenderness, no swelling, no effusion, no deformity, no pain, no spasm, normal pulse and normal strength.       Right elbow: Normal.      Right wrist: Normal.  Positive Apley's scratch test, positive lift off test, negative drop test, negative open can test TTP to glenohumeral joint No ttp of scapula, AC joint, CS joint, clavicle Normal ROM, normal strength, normal sensation, normal radial pulses  Lymphadenopathy:    She has no cervical adenopathy.  Neurological: She is alert and oriented to person, place, and time. She exhibits normal muscle tone. Coordination normal.  Skin: Skin is warm and dry. No rash noted. She is not diaphoretic. No erythema. No pallor.  Psychiatric: She has a normal mood and affect. Her behavior is normal. Judgment and thought content normal.        ED Course  Procedures  (including critical care time) Labs Review Labs Reviewed - No data to display  Imaging Review No results found. I have personally reviewed and evaluated these images and lab results as part of my medical decision-making.   EKG Interpretation None      MDM   Final diagnoses:  Right shoulder pain    Acute on chronic right shoulder pain No instability, no indications for imaging at this time Patient has previously seen orthopedic surgeon, she will be referred to on-call orthopedist this time Given NSAID and pain control. Advised rest and rice therapy Neck concern for septic arthritis, dislocation or fracture.  Discharged home in satisfactory condition.   Danelle BerryLeisa Shamekia Tippets, PA-C 07/29/15 [id number]  Shon Batonourtney F Horton, MD 08/02/15 [phone]

## 2015-07-28 NOTE — ED Notes (Signed)
Right shoulder pain. Denies injury. States she has had pain since a young child. Flare up since Friday.

## 2015-08-06 ENCOUNTER — Encounter (HOSPITAL_BASED_OUTPATIENT_CLINIC_OR_DEPARTMENT_OTHER): Payer: Self-pay | Admitting: *Deleted

## 2015-08-06 ENCOUNTER — Emergency Department (HOSPITAL_BASED_OUTPATIENT_CLINIC_OR_DEPARTMENT_OTHER)
Admission: EM | Admit: 2015-08-06 | Discharge: 2015-08-06 | Disposition: A | Discharge status: Home / Self Care | Payer: 59 | Attending: Emergency Medicine | Admitting: Emergency Medicine

## 2015-08-06 DIAGNOSIS — M79604 Pain in right leg: Secondary | ICD-10-CM

## 2015-08-06 DIAGNOSIS — Z79899 Other long term (current) drug therapy: Secondary | ICD-10-CM | POA: Insufficient documentation

## 2015-08-06 DIAGNOSIS — X509XXA Other and unspecified overexertion or strenuous movements or postures, initial encounter: Secondary | ICD-10-CM | POA: Insufficient documentation

## 2015-08-06 DIAGNOSIS — Z8639 Personal history of other endocrine, nutritional and metabolic disease: Secondary | ICD-10-CM | POA: Insufficient documentation

## 2015-08-06 DIAGNOSIS — Z7951 Long term (current) use of inhaled steroids: Secondary | ICD-10-CM | POA: Diagnosis not present

## 2015-08-06 DIAGNOSIS — Y998 Other external cause status: Secondary | ICD-10-CM | POA: Insufficient documentation

## 2015-08-06 DIAGNOSIS — Y9389 Activity, other specified: Secondary | ICD-10-CM | POA: Diagnosis not present

## 2015-08-06 DIAGNOSIS — G43909 Migraine, unspecified, not intractable, without status migrainosus: Secondary | ICD-10-CM | POA: Insufficient documentation

## 2015-08-06 DIAGNOSIS — S8991XA Unspecified injury of right lower leg, initial encounter: Secondary | ICD-10-CM | POA: Insufficient documentation

## 2015-08-06 DIAGNOSIS — Z791 Long term (current) use of non-steroidal anti-inflammatories (NSAID): Secondary | ICD-10-CM | POA: Insufficient documentation

## 2015-08-06 DIAGNOSIS — Y92009 Unspecified place in unspecified non-institutional (private) residence as the place of occurrence of the external cause: Secondary | ICD-10-CM | POA: Diagnosis not present

## 2015-08-06 MED ORDER — NAPROXEN 500 MG PO TABS: 500.0000 mg | ORAL_TABLET | Freq: Two times a day (BID) | ORAL | Status: DC

## 2015-08-06 NOTE — Discharge Instructions (Signed)
Take the prescribed medication as directed. May continue to ice or apply heat, whichever feels better. Follow-up with your primary care physician. I've also given you contact information for orthopedics if you feel you need to see them. Return to the ED for new or worsening symptoms.

## 2015-08-06 NOTE — ED Provider Notes (Signed)
CSN: [id number]     Arrival date & time 08/06/15  1743 History   First MD Initiated Contact with Patient 08/06/15 1811     Chief Complaint  Patient presents with  . Leg Pain     (Consider location/radiation/quality/duration/timing/severity/associated sxs/prior Treatment) Patient is a 24 y.o. female presenting with leg pain. The history is provided by the patient and medical records.  Leg Pain   This is a 24 year old female with history of migraine headaches, back pain, presenting to the ED for right calf pain. Patient states on Sunday she was having a pins and needles type sensation in her. States this only lasted for about a minute or so before resolving completely. States last night she was sitting on the couch watching TV she felt a "pop" in her right calf. States now she is having pain. Pain is worse with ambulation.  Patient denies history of DVT. Patient recently started on Depo, received first injection earlier this month.  Patient has been taking Motrin with some relief.  VSS.  Past Medical History  Diagnosis Date  . Deficiency of potassium   . Migraine   . Hypokalemia   . Back pain    History reviewed. No pertinent past surgical history. Family History  Problem Relation Age of Onset  . COPD Mother    Social History  Substance Use Topics  . Smoking status: Never Smoker   . Smokeless tobacco: Never Used  . Alcohol Use: No   OB History    No data available     Review of Systems  Musculoskeletal: Positive for myalgias and arthralgias.  All other systems reviewed and are negative.     Allergies  Review of patient's allergies indicates no known allergies.  Home Medications   Prior to Admission medications   Medication Sig Start Date End Date Taking? Authorizing Provider  eletriptan (RELPAX) 40 MG tablet Take 1 tablet (40 mg total) by mouth as needed for migraine or headache. May repeat in 2 hours if headache persists or recurs. 07/24/15  Yes Sheliah HatchKatherine E Tabori,  MD  topiramate (TOPAMAX) 25 MG tablet Start 1 tab QHS x1 week, then 1 tab twice daily x1 week, then 1 tab qAM and 2 tabs QHS x1 week, and then 2 tabs twice daily 05/15/15  Yes Sheliah HatchKatherine E Tabori, MD  traZODone (DESYREL) 50 MG tablet Take 0.5-1 tablets (25-50 mg total) by mouth at bedtime as needed for sleep. 06/20/14  Yes Sheliah HatchKatherine E Tabori, MD  cyclobenzaprine (FLEXERIL) 10 MG tablet  04/15/15   Historical Provider, MD  fluticasone (FLONASE) 50 MCG/ACT nasal spray Place 2 sprays into both nostrils daily. 06/30/15   Ramon DredgeEdward Saguier, PA-C  ibuprofen (ADVIL,MOTRIN) 800 MG tablet  04/15/15   Historical Provider, MD  loratadine (CLARITIN) 10 MG tablet Take 1 tablet (10 mg total) by mouth daily. 06/30/15   Ramon DredgeEdward Saguier, PA-C  meloxicam (MOBIC) 7.5 MG tablet Take 2 tablets (15 mg total) by mouth daily. 07/28/15   Danelle BerryLeisa Tapia, PA-C  MONO-LINYAH 0.25-35 MG-MCG tablet TAKE 1 TABLET BY MOUTH DAILY 07/03/15   Sheliah HatchKatherine E Tabori, MD  ondansetron (ZOFRAN) 8 MG tablet Take 1 tablet (8 mg total) by mouth every 8 (eight) hours as needed for nausea or vomiting. 05/29/15   Ramon DredgeEdward Saguier, PA-C  traMADol (ULTRAM) 50 MG tablet Take 1 tablet (50 mg total) by mouth every 6 (six) hours as needed. 07/28/15   Danelle BerryLeisa Tapia, PA-C   BP 118/77 mmHg  Pulse 104  Temp(Src) 98.9 F (37.2 C) (  Oral)  Resp 18  Ht  (1.549 m)  Wt 105 lb (47.628 kg)  BMI 19.85 kg/m2  SpO2 100%   Physical Exam  Constitutional: She is oriented to person, place, and time. She appears well-developed and well-nourished.  HENT:  Head: Normocephalic and atraumatic.  Mouth/Throat: Oropharynx is clear and moist.  Eyes: Conjunctivae and EOM are normal. Pupils are equal, round, and reactive to light.  Neck: Normal range of motion.  Cardiovascular: Normal rate, regular rhythm and normal heart sounds.   Pulmonary/Chest: Effort normal and breath sounds normal. No respiratory distress. She has no wheezes.  Abdominal: Soft. Bowel sounds are normal.   Musculoskeletal: Normal range of motion.       Right knee: Normal.       Right ankle: Normal.       Right lower leg: Normal.  Right calf is normal in appearance without swelling or visible deformity; no bruising or overlying skin changes; gastrocnemius muscle is contracting normally, no bulges to suggest rupture No calf asymmetry or palpable cords; DP pulses intact bilaterally; normal sensation throughout both legs and feet  Neurological: She is alert and oriented to person, place, and time.  Skin: Skin is warm and dry.  Psychiatric: She has a normal mood and affect.  Nursing note and vitals reviewed.   ED Course  Procedures (including critical care time) Labs Review Labs Reviewed - No data to display  Imaging Review No results found. I have personally reviewed and evaluated these images and lab results as part of my medical decision-making.   EKG Interpretation None      MDM   Final diagnoses:  Leg pain, right   24 year old female here with right calf pain. No deformities noted on exam.  Exam findings are not consistent with DVT, gastrocnemius tear, or tendon rupture. Her legs are neurovascularly intact bilaterally.  Patient may have a mild muscle strain. She was encouraged to follow-up with her primary care physician. She was also given referral to orthopedics if needed. Rx Naprosyn.  Discussed plan with patient, he/she acknowledged understanding and agreed with plan of care.  Return precautions given for new or worsening symptoms.  Garlon Hatchet, PA-C 08/06/15 2014  Lyndal Pulley, MD 08/07/15 1249

## 2015-08-06 NOTE — ED Notes (Signed)
Pt reports "pins and needles" in left calf Sunday- states last night she "felt a pop" her calf while sitting down and now pain is worse. Ambulated to triage with limp

## 2015-08-18 ENCOUNTER — Encounter: Payer: Self-pay | Admitting: Internal Medicine

## 2015-08-18 ENCOUNTER — Ambulatory Visit (INDEPENDENT_AMBULATORY_CARE_PROVIDER_SITE_OTHER): Payer: 59 | Admitting: Internal Medicine

## 2015-08-18 VITALS — BP 112/74 | HR 69 | Temp 98.2°F | Ht 61.0 in | Wt 104.2 lb

## 2015-08-18 DIAGNOSIS — G43909 Migraine, unspecified, not intractable, without status migrainosus: Secondary | ICD-10-CM

## 2015-08-18 MED ORDER — KETOROLAC TROMETHAMINE 60 MG/2ML IM SOLN
30.0000 mg | Freq: Once | INTRAMUSCULAR | Status: AC
  Administered 2015-08-18: 30 mg via INTRAMUSCULAR

## 2015-08-18 NOTE — Patient Instructions (Addendum)
Continue Topamax as instructed  As soon as you have a headache: Rest, fluids. Start Relpax: 1 tablet by mouth on palpating 2 hours if you are not improving. Take ibuprofen  or Tylenol  Keep the  appointment to see neurology.

## 2015-08-18 NOTE — Progress Notes (Signed)
Pre visit review using our clinic review tool, if applicable. No additional management support is needed unless otherwise documented below in the visit note. 

## 2015-08-18 NOTE — Progress Notes (Signed)
Subjective:    Patient ID: Tammy Mejia, female    DOB: 07/23/1991, 24 y.o.   MRN: [id number]  DOS:  08/18/2015 Type of visit - description : Acute visit Interval history:  States that developed migraines in the last few days, was intense 2 days ago, she took NSAIDs without much help and  one tablet of Relpax. As of today the headache has decrease but  is still ~ 6/10 today. Sx felt like her regular migraines.  Review of Systems   did have some photo-phonophobia with a headache. Not the worst headache of her life No fever chills. No nausea vomiting. No depression, some anxiety.  Past Medical History  Diagnosis Date  . Deficiency of potassium   . Migraine   . Hypokalemia   . Back pain     History reviewed. No pertinent past surgical history.  Social History   Social History  . Marital Status: Single    Spouse Name: N/A  . Number of Children: N/A  . Years of Education: N/A   Occupational History  . Not on file.   Social History Main Topics  . Smoking status: Never Smoker   . Smokeless tobacco: Never Used  . Alcohol Use: No  . Drug Use: No  . Sexual Activity: Yes    Birth Control/ Protection: Injection   Other Topics Concern  . Not on file   Social History Narrative        Medication List       This list is accurate as of: 08/18/15  7:23 PM.  Always use your most recent med list.               eletriptan 40 MG tablet  Commonly known as:  RELPAX  Take 1 tablet (40 mg total) by mouth as needed for migraine or headache. May repeat in 2 hours if headache persists or recurs.     fluticasone 50 MCG/ACT nasal spray  Commonly known as:  FLONASE  Place 2 sprays into both nostrils daily.     ibuprofen 800 MG tablet  Commonly known as:  ADVIL,MOTRIN     loratadine 10 MG tablet  Commonly known as:  CLARITIN  Take 1 tablet (10 mg total) by mouth daily.     medroxyPROGESTERone 150 MG/ML injection  Commonly known as:  DEPO-PROVERA  Inject 150  mg into the muscle every 3 (three) months.     ondansetron 8 MG tablet  Commonly known as:  ZOFRAN  Take 1 tablet (8 mg total) by mouth every 8 (eight) hours as needed for nausea or vomiting.     topiramate 25 MG tablet  Commonly known as:  TOPAMAX  Start 1 tab QHS x1 week, then 1 tab twice daily x1 week, then 1 tab qAM and 2 tabs QHS x1 week, and then 2 tabs twice daily     traZODone 50 MG tablet  Commonly known as:  DESYREL  Take 0.5-1 tablets (25-50 mg total) by mouth at bedtime as needed for sleep.           Objective:   Physical Exam BP 112/74 mmHg  Pulse 69  Temp(Src) 98.2 F (36.8 C) (Oral)  Ht 5\' 1"  (1.549 m)  Wt 104 lb 4 oz (47.287 kg)  BMI 19.71 kg/m2  SpO2 98% General:   Well developed, well nourished . NAD.  HEENT:  Normocephalic . Face symmetric, atraumatic Neck: Full range of motion Skin: Not pale. Not jaundice Neurologic:  alert & oriented  X3.  Speech normal, gait appropriate for age and unassisted. Motor and DTRs symmetric Psych--  Cognition and judgment appear intact.  Cooperative with normal attention span and concentration.  Behavior appropriate. No anxious or depressed appearing.      Assessment & Plan:    migraine exacerbation: Neurological exam normal. Recommend to continue with Topamax, treat acute exacerbation with rest, fluids, Relpax. Recommend to keep neuro appointment. Request a shot, Toradol 30 mg IM provided. Request a work note, one for today is provided.

## 2015-08-19 ENCOUNTER — Ambulatory Visit: Payer: 59 | Admitting: Neurology

## 2015-08-25 ENCOUNTER — Other Ambulatory Visit: Payer: Self-pay | Admitting: Family Medicine

## 2015-08-25 NOTE — Telephone Encounter (Signed)
Medication filled to pharmacy as requested.   

## 2015-08-29 ENCOUNTER — Telehealth: Payer: Self-pay | Admitting: Neurology

## 2015-08-29 ENCOUNTER — Ambulatory Visit: Payer: 59 | Admitting: Neurology

## 2015-08-29 NOTE — Telephone Encounter (Signed)
This patient did not show for a new patient appointment today. 

## 2015-09-05 ENCOUNTER — Encounter: Payer: Self-pay | Admitting: Neurology

## 2015-11-01 IMAGING — CR DG CHEST 2V
2 series · 2 of 2 positions shown · non-contrast
Comparison: 04/29/2014.

CLINICAL DATA: Initial encounter for two-day history shortness of
breath with some slight chest pain

EXAM:
CHEST  2 VIEW

[w chest pa]
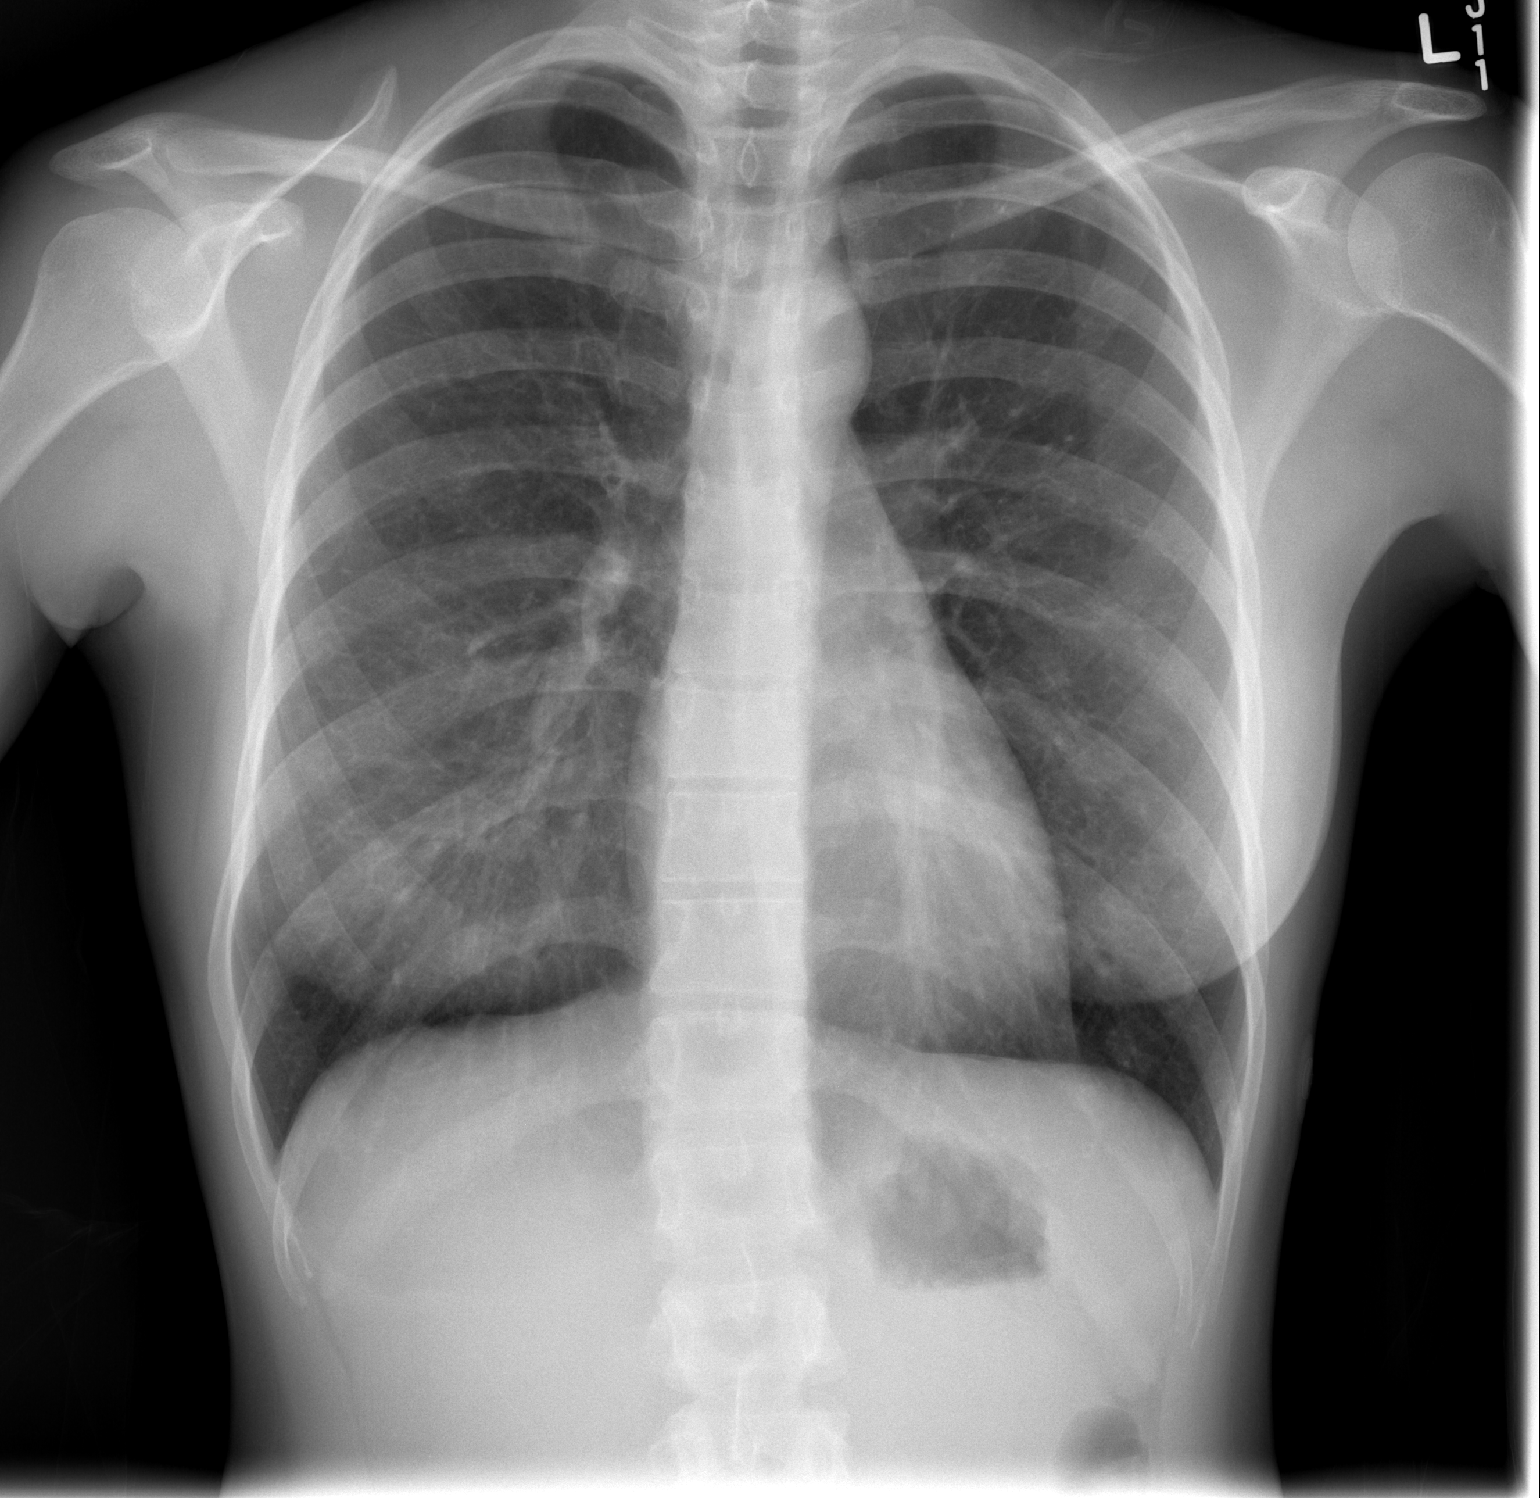

[w chest lat]
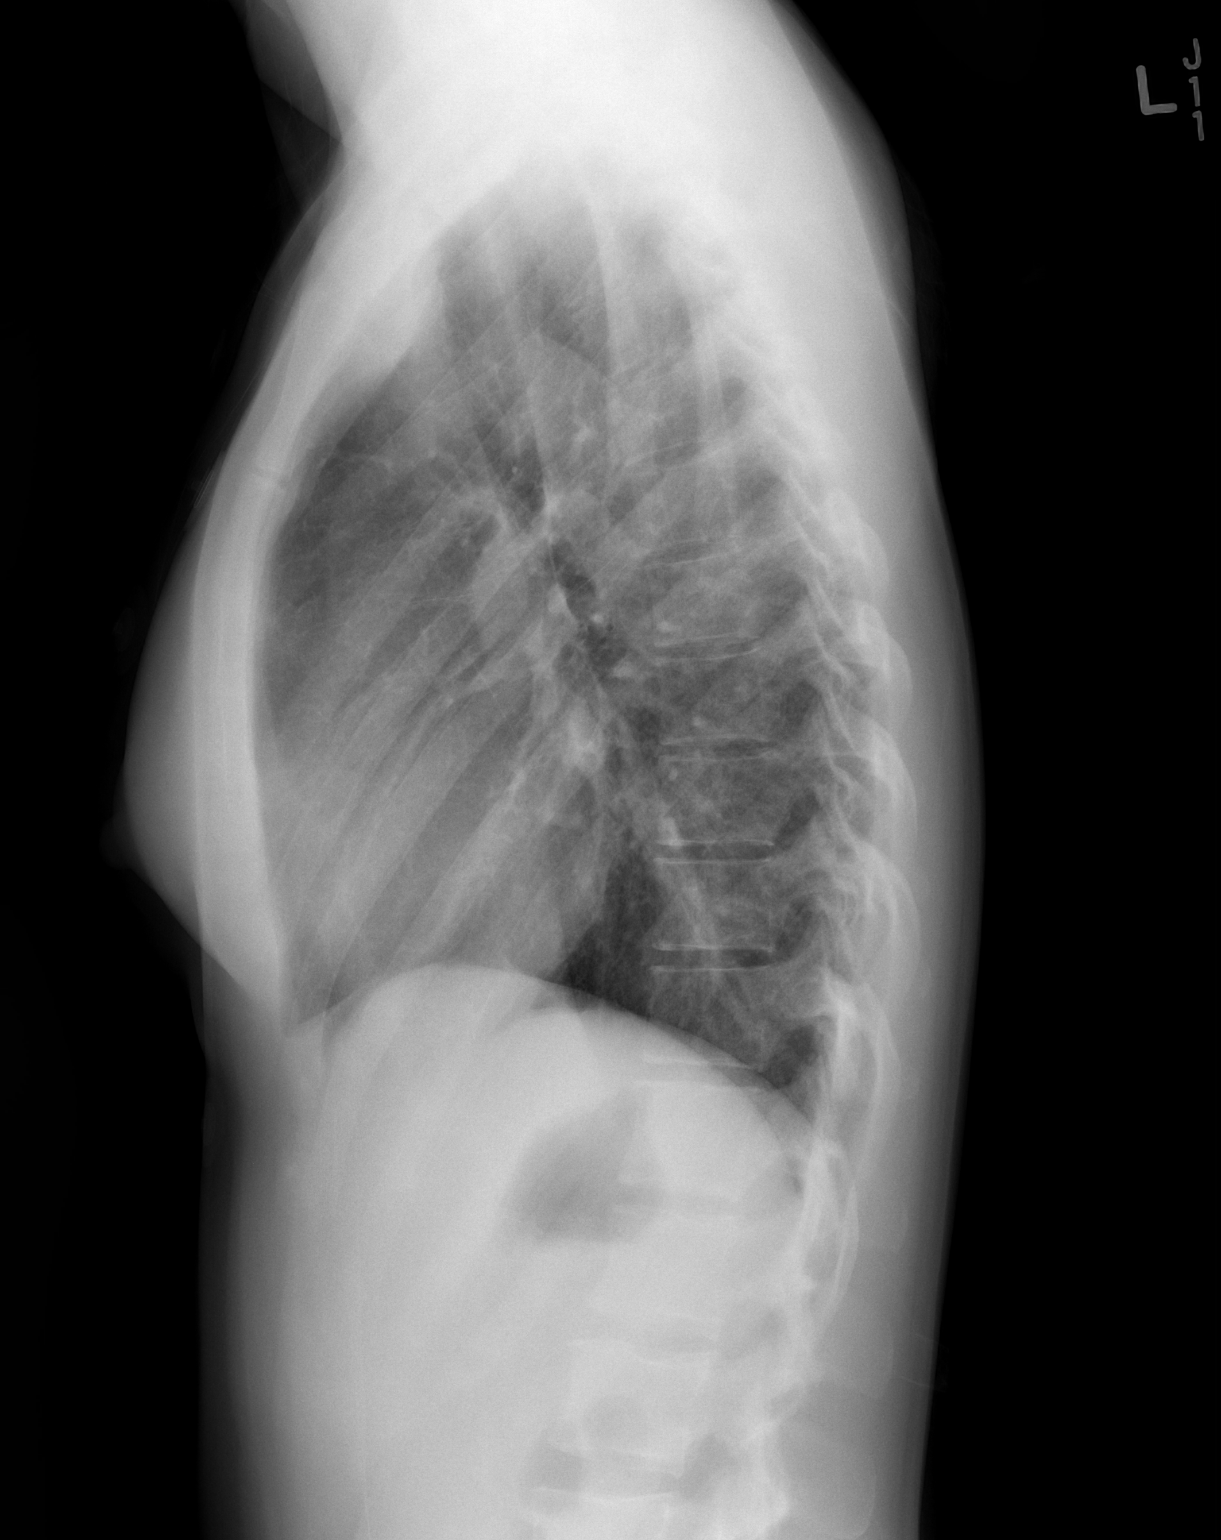

[2 of 2 positions shown; findings below may reference images not displayed]

FINDINGS: The heart size and mediastinal contours are within normal limits.
Both lungs are clear. The visualized skeletal structures are
unremarkable.
IMPRESSION: Normal exam.

## 2015-11-20 ENCOUNTER — Telehealth: Payer: Self-pay | Admitting: Family Medicine

## 2015-11-20 NOTE — Telephone Encounter (Signed)
Caller name: Self Relationship to patient: Can be reached: [phone] Pharmacy:  Reason for call: Request refill on

## 2015-11-21 ENCOUNTER — Emergency Department (HOSPITAL_BASED_OUTPATIENT_CLINIC_OR_DEPARTMENT_OTHER)
Admission: EM | Admit: 2015-11-21 | Discharge: 2015-11-21 | Disposition: A | Discharge status: Home / Self Care | Payer: Self-pay | Attending: Emergency Medicine | Admitting: Emergency Medicine

## 2015-11-21 ENCOUNTER — Encounter: Payer: 59 | Admitting: Medical

## 2015-11-21 ENCOUNTER — Emergency Department (HOSPITAL_BASED_OUTPATIENT_CLINIC_OR_DEPARTMENT_OTHER): Payer: Self-pay

## 2015-11-21 ENCOUNTER — Telehealth: Payer: Self-pay | Admitting: Family Medicine

## 2015-11-21 ENCOUNTER — Encounter (HOSPITAL_BASED_OUTPATIENT_CLINIC_OR_DEPARTMENT_OTHER): Payer: Self-pay | Admitting: Emergency Medicine

## 2015-11-21 DIAGNOSIS — Z8639 Personal history of other endocrine, nutritional and metabolic disease: Secondary | ICD-10-CM | POA: Insufficient documentation

## 2015-11-21 DIAGNOSIS — M546 Pain in thoracic spine: Secondary | ICD-10-CM | POA: Insufficient documentation

## 2015-11-21 DIAGNOSIS — G43909 Migraine, unspecified, not intractable, without status migrainosus: Secondary | ICD-10-CM | POA: Insufficient documentation

## 2015-11-21 DIAGNOSIS — R0789 Other chest pain: Secondary | ICD-10-CM | POA: Insufficient documentation

## 2015-11-21 MED ORDER — KETOROLAC TROMETHAMINE 30 MG/ML IJ SOLN
30.0000 mg | Freq: Once | INTRAMUSCULAR | Status: AC
  Administered 2015-11-21: 30 mg via INTRAMUSCULAR
  Filled 2015-11-21: qty 1

## 2015-11-21 MED ORDER — NAPROXEN 500 MG PO TABS
500.0000 mg | ORAL_TABLET | Freq: Two times a day (BID) | ORAL | Status: DC
Start: 2015-11-21 — End: 2016-01-13

## 2015-11-21 NOTE — ED Notes (Signed)
Nicole, PA-C in room with patient now. 

## 2015-11-21 NOTE — Progress Notes (Signed)
This encounter was created in error - please disregard.

## 2015-11-21 NOTE — ED Notes (Signed)
25 yo with sudden onset of thoracic pain that began on Tuesday. Reports shortness of breath with the pain and has taking Advil with no relief.

## 2015-11-21 NOTE — ED Provider Notes (Signed)
CSN: [id number]     Arrival date & time 11/21/15  1708 History   First MD Initiated Contact with Patient 11/21/15 1725     Chief Complaint  Patient presents with  . Back Pain     (Consider location/radiation/quality/duration/timing/severity/associated sxs/prior Treatment) HPI   Patient is a 25 year old female with no pertinent past medical history who presents to the ED with complaint of right sided mid back pain, onset 4 days. Patient reports while she was sitting in her car driving she began noticing pain to the right side of her back near her mid thoracic region/right lateral chest wall. Patient reports having mild sharp constant pain which she notes has gradually worsened over the past few days. She reports the pain is worsened with moving, bending, lifting or taking a deep breath. She reports feeling mildly short of breath due to the pain. Denies any recent fall, trauma, injury. Pt denies fever, numbness, tingling, saddle anesthesia, abdominal pain, nausea, vomiting, urinary retention loss of bowel or bladder, weakness, IVDU, cancer or recent spinal manipulation. Patient notes she took one dose of Advil at home 3 days ago without relief.  Past Medical History  Diagnosis Date  . Deficiency of potassium   . Migraine   . Hypokalemia   . Back pain    History reviewed. No pertinent past surgical history. Family History  Problem Relation Age of Onset  . COPD Mother    Social History  Substance Use Topics  . Smoking status: Never Smoker   . Smokeless tobacco: Never Used  . Alcohol Use: No   OB History    No data available     Review of Systems  Respiratory: Positive for shortness of breath (due to pain).   Musculoskeletal: Positive for back pain.  All other systems reviewed and are negative.     Allergies  Review of patient's allergies indicates no known allergies.  Home Medications   Prior to Admission medications   Medication Sig Start Date End Date Taking?  Authorizing Provider  eletriptan (RELPAX) 40 MG tablet Take 1 tablet (40 mg total) by mouth as needed for migraine or headache. May repeat in 2 hours if headache persists or recurs. 07/24/15   Sheliah Hatch, MD  fluticasone (FLONASE) 50 MCG/ACT nasal spray Place 2 sprays into both nostrils daily. Patient not taking: Reported on 08/18/2015 06/30/15   Esperanza Richters, PA-C  ibuprofen (ADVIL,MOTRIN) 800 MG tablet  04/15/15   Historical Provider, MD  loratadine (CLARITIN) 10 MG tablet Take 1 tablet (10 mg total) by mouth daily. Patient not taking: Reported on 08/18/2015 06/30/15   Esperanza Richters, PA-C  medroxyPROGESTERone (DEPO-PROVERA) 150 MG/ML injection Inject 150 mg into the muscle every 3 (three) months.    Historical Provider, MD  naproxen (NAPROSYN) 500 MG tablet Take 1 tablet (500 mg total) by mouth 2 (two) times daily. 11/21/15   Barrett Henle, PA-C  ondansetron (ZOFRAN) 8 MG tablet Take 1 tablet (8 mg total) by mouth every 8 (eight) hours as needed for nausea or vomiting. Patient not taking: Reported on 08/18/2015 05/29/15   Esperanza Richters, PA-C  topiramate (TOPAMAX) 25 MG tablet Start 1 tab QHS x1 week, then 1 tab twice daily x1 week, then 1 tab qAM and 2 tabs QHS x1 week, and then 2 tabs twice daily Patient not taking: Reported on 08/18/2015 05/15/15   Sheliah Hatch, MD  traZODone (DESYREL) 50 MG tablet TAKE 1/2 TO 1 TABLET BY MOUTH AT BEDTIME AS NEEDED FOR SLEEP 08/25/15  Sheliah HatchKatherine E Tabori, MD   BP 106/67 mmHg  Pulse 88  Temp(Src) 98.1 F (36.7 C) (Oral)  Resp 16  Ht 5\' 1"  (1.549 m)  Wt 47.628 kg  BMI 19.85 kg/m2  SpO2 100%  LMP  (LMP Unknown) Physical Exam  Constitutional: She is oriented to person, place, and time. She appears well-developed and well-nourished. No distress.  HENT:  Head: Normocephalic and atraumatic.  Mouth/Throat: Oropharynx is clear and moist. No oropharyngeal exudate.  Eyes: Conjunctivae and EOM are normal. Pupils are equal, round, and  reactive to light. Right eye exhibits no discharge. Left eye exhibits no discharge. No scleral icterus.  Neck: Normal range of motion. Neck supple.  Cardiovascular: Normal rate, regular rhythm, normal heart sounds and intact distal pulses.   Pulmonary/Chest: Effort normal and breath sounds normal. No respiratory distress. She has no wheezes. She has no rales. She exhibits tenderness. She exhibits no laceration, no crepitus, no edema, no deformity, no swelling and no retraction.  Right lateral chest wall tender to palpation  Abdominal: Soft. Bowel sounds are normal. She exhibits no distension and no mass. There is no tenderness. There is no rebound and no guarding.  Musculoskeletal: Normal range of motion. She exhibits no edema or tenderness.  No midline C, T, or L tenderness. Full range of motion of neck and back. Full range of motion of bilateral upper and lower extremities, with 5/5 strength. Sensation intact. 2+ radial and PT pulses. Cap refill <2 seconds. Patient able to stand and ambulate without assistance.    Lymphadenopathy:    She has no cervical adenopathy.  Neurological: She is alert and oriented to person, place, and time. She has normal strength and normal reflexes. No sensory deficit. Gait normal.  Skin: Skin is warm and dry. She is not diaphoretic.  Nursing note and vitals reviewed.   ED Course  Procedures (including critical care time) Labs Review Labs Reviewed - No data to display  Imaging Review Dg Ribs Unilateral W/chest Right  11/21/2015  CLINICAL DATA:  25 year old female with right lateral chest wall pain. EXAM: RIGHT RIBS AND CHEST - 3+ VIEW COMPARISON:  Radiograph dated 11/28/2014 FINDINGS: No fracture or other bone lesions are seen involving the ribs. There is no evidence of pneumothorax or pleural effusion. Both lungs are clear. Heart size and mediastinal contours are within normal limits. IMPRESSION: Negative. Electronically Signed   By: Elgie CollardArash  Radparvar M.D.   On:  11/21/2015 18:01   I have personally reviewed and evaluated these images and lab results as part of my medical decision-making.   EKG Interpretation None      MDM   Final diagnoses:  Right-sided thoracic back pain    Patient presents with right mid thoracic/right lateral chest wall pain. Denies any recent fall, trauma, injury. No back pain red flags. VSS. Exam revealed mild tenderness over right lateral chest wall, lungs CTAB, remaining exam unremarkable no neuro deficits, bilateral upper and lower extremities neurovascularly intact. Patient given dose of Toradol in the ED. Right rib x-ray negative. I suspect patient's pain is likely due to to muscle strain and do not feel that any further workup or imaging is warranted at this time. On reevaluation patient reports her pain has improved. Discussed results and plan for discharge with patient. Plan to discharge patient home with NSAIDs and symptomatically treatment. Advised patient to follow up with her PCP.  Evaluation does not show pathology requring ongoing emergent intervention or admission. Pt is hemodynamically stable and mentating appropriately. Discussed findings/results  and plan with patient/guardian, who agrees with plan. All questions answered. Return precautions discussed and outpatient follow up given.      Satira Sark [place], [place] 11/21/15 1859  Alvira Monday, MD 11/22/15 1230

## 2015-11-21 NOTE — Discharge Instructions (Signed)
Take your medications as prescribed. I recommend applying ice to affected area for 15-20 minutes 3-4 times daily for pain relief. I also recommend refraining from doing any heavy lifting, repetitive movements or exercises that exacerbate her symptoms for the next few days. Follow-up with your primary care provider on Monday if your symptoms have not improved. Return to the emergency department if symptoms worsen or new onset of fever, chest pain, difficulty breathing, cough, wheezing, palpitations, numbness, tingling, groin anesthesia, loss of bowel or bladder, weakness.

## 2015-11-24 ENCOUNTER — Encounter: Payer: Self-pay | Admitting: Medical

## 2015-11-24 NOTE — Telephone Encounter (Signed)
Marked to charge, mailing no show letter °

## 2015-11-24 NOTE — Telephone Encounter (Signed)
charge 

## 2015-11-24 NOTE — Telephone Encounter (Signed)
Pt was no show 11/21/15 2:45pt for acute appt, pt went to ER later same day for same issue, charge or no charge?

## 2015-11-25 NOTE — Telephone Encounter (Signed)
error:[id number] ° °

## 2016-01-13 ENCOUNTER — Encounter: Payer: Self-pay | Admitting: Family Medicine

## 2016-01-13 ENCOUNTER — Ambulatory Visit (INDEPENDENT_AMBULATORY_CARE_PROVIDER_SITE_OTHER): Payer: Self-pay | Admitting: Physician Assistant

## 2016-01-13 ENCOUNTER — Encounter: Payer: Self-pay | Admitting: Physician Assistant

## 2016-01-13 VITALS — BP 94/62 | HR 58 | Temp 98.1°F | Ht 61.0 in | Wt 105.6 lb

## 2016-01-13 DIAGNOSIS — R5383 Other fatigue: Secondary | ICD-10-CM

## 2016-01-13 DIAGNOSIS — R42 Dizziness and giddiness: Secondary | ICD-10-CM

## 2016-01-13 LAB — CBC
HCT: 41.9 % (ref 36.0–46.0)
Hemoglobin: 13.7 g/dL (ref 12.0–15.0)
MCHC: 32.7 g/dL (ref 30.0–36.0)
MCV: 85.7 fl (ref 78.0–100.0)
PLATELETS: 331 10*3/uL (ref 150.0–400.0)
RBC: 4.89 Mil/uL (ref 3.87–5.11)
RDW: 13.7 % (ref 11.5–15.5)
WBC: 7.9 10*3/uL (ref 4.0–10.5)

## 2016-01-13 LAB — COMPREHENSIVE METABOLIC PANEL
ALBUMIN: 4.4 g/dL (ref 3.5–5.2)
ALT: 10 U/L (ref 0–35)
AST: 18 U/L (ref 0–37)
Alkaline Phosphatase: 75 U/L (ref 39–117)
BUN: 6 mg/dL (ref 6–23)
CALCIUM: 9.9 mg/dL (ref 8.4–10.5)
CO2: 28 meq/L (ref 19–32)
CREATININE: 0.73 mg/dL (ref 0.40–1.20)
Chloride: 103 mEq/L (ref 96–112)
GFR: 103.56 mL/min (ref >60.00)
Glucose, Bld: 83 mg/dL (ref 70–99)
Potassium: 3.9 mEq/L (ref 3.5–5.1)
Sodium: 139 mEq/L (ref 135–145)
TOTAL PROTEIN: 7.9 g/dL (ref 6.0–8.3)
Total Bilirubin: 0.6 mg/dL (ref 0.2–1.2)

## 2016-01-13 LAB — IRON: Iron: 56 ug/dL (ref 42–145)

## 2016-01-13 LAB — TSH: TSH: 1.19 u[IU]/mL (ref 0.35–4.50)

## 2016-01-13 LAB — VITAMIN B12: VITAMIN B 12: 301 pg/mL (ref 211–911)

## 2016-01-13 NOTE — Patient Instructions (Signed)
Please go to the lab for blood work. I will call with your results. Increase intake of foods. I also want you to add on a G2 Gatorade once daily to replenish electrolytes and to help with blood pressure.  We will alter your regimen based on results.

## 2016-01-13 NOTE — Progress Notes (Signed)
Pre visit review using our clinic review tool, if applicable. No additional management support is needed unless otherwise documented below in the visit note. 

## 2016-01-13 NOTE — Progress Notes (Signed)
Patient presents to clinic today c/o 2 days of fatigue and lightheadedness. Denies fever, chills. Denies URI symptoms. Endorses some nausea. Denies abdominal pain. Denies change to bowel or bladder habits. LMP December-January 2016. Stopped Depo Provera. Denies concern of pregnancy. Endorses staying well hydrated. Endorses snacking throughout the day. Does have history of hypokalemia. Is not currently on any supplement.   Past Medical History  Diagnosis Date  . Deficiency of potassium   . Migraine   . Hypokalemia   . Back pain     Current Outpatient Prescriptions on File Prior to Visit  Medication Sig Dispense Refill  . eletriptan (RELPAX) 40 MG tablet Take 1 tablet (40 mg total) by mouth as needed for migraine or headache. May repeat in 2 hours if headache persists or recurs. 10 tablet 6  . loratadine (CLARITIN) 10 MG tablet Take 1 tablet (10 mg total) by mouth daily. 30 tablet 0  . topiramate (TOPAMAX) 25 MG tablet Start 1 tab QHS x1 week, then 1 tab twice daily x1 week, then 1 tab qAM and 2 tabs QHS x1 week, and then 2 tabs twice daily 120 tablet 3  . traZODone (DESYREL) 50 MG tablet TAKE 1/2 TO 1 TABLET BY MOUTH AT BEDTIME AS NEEDED FOR SLEEP 30 tablet 3   No current facility-administered medications on file prior to visit.    No Known Allergies  Family History  Problem Relation Age of Onset  . COPD Mother     Social History   Social History  . Marital Status: Single    Spouse Name: N/A  . Number of Children: N/A  . Years of Education: N/A   Social History Main Topics  . Smoking status: Never Smoker   . Smokeless tobacco: Never Used  . Alcohol Use: No  . Drug Use: No  . Sexual Activity: Yes    Birth Control/ Protection: Injection   Other Topics Concern  . None   Social History Narrative   Review of Systems - See HPI.  All other ROS are negative.  BP 98/60 mmHg  Pulse 58  Temp(Src) 98.1 F (36.7 C) (Oral)  Ht '5\' 1"'  (1.549 m)  Wt 105 lb 9.6 oz (47.9 kg)   BMI 19.96 kg/m2  SpO2 98%  Physical Exam  Constitutional: She is oriented to person, place, and time and well-developed, well-nourished, and in no distress.  HENT:  Head: Normocephalic and atraumatic.  Right Ear: External ear normal.  Left Ear: External ear normal.  Nose: Nose normal.  Mouth/Throat: Oropharynx is clear and moist.  TM within normal limits  Eyes: Conjunctivae are normal.  Neck: Neck supple.  Cardiovascular: Normal rate, regular rhythm, normal heart sounds and intact distal pulses.   Pulmonary/Chest: Effort normal and breath sounds normal. No respiratory distress. She has no wheezes. She has no rales. She exhibits no tenderness.  Neurological: She is alert and oriented to person, place, and time.  Skin: Skin is warm and dry. No rash noted.  Psychiatric: Affect normal.  Vitals reviewed.   No results found for this or any previous visit (from the past 2160 hour(s)).  Assessment/Plan: 1. Other fatigue with lightheadedness Exam unremarkable. Blood pressure on the lower end, but is not uncommon for patient. Orthostatics are negative. POC UA and Urine pregnancy negative. Will check lab panel today to further assess. Patient encouraged to eat more frequently and stay hydrated. Will add on a G2 gatorade daily. Will alter regimen based on results.   - POCT urinalysis dipstick -  POCT urine pregnancy - CBC - Comp Met (CMET) - TSH - B12 - Iron

## 2016-02-05 ENCOUNTER — Ambulatory Visit: Payer: Self-pay | Admitting: Family Medicine

## 2016-02-05 ENCOUNTER — Emergency Department (HOSPITAL_BASED_OUTPATIENT_CLINIC_OR_DEPARTMENT_OTHER)
Admission: EM | Admit: 2016-02-05 | Discharge: 2016-02-05 | Disposition: A | Discharge status: Home / Self Care | Payer: Self-pay | Attending: Emergency Medicine | Admitting: Emergency Medicine

## 2016-02-05 ENCOUNTER — Encounter (HOSPITAL_BASED_OUTPATIENT_CLINIC_OR_DEPARTMENT_OTHER): Payer: Self-pay | Admitting: *Deleted

## 2016-02-05 DIAGNOSIS — R51 Headache: Secondary | ICD-10-CM | POA: Insufficient documentation

## 2016-02-05 DIAGNOSIS — R519 Headache, unspecified: Secondary | ICD-10-CM

## 2016-02-05 DIAGNOSIS — R110 Nausea: Secondary | ICD-10-CM | POA: Insufficient documentation

## 2016-02-05 DIAGNOSIS — R630 Anorexia: Secondary | ICD-10-CM | POA: Insufficient documentation

## 2016-02-05 LAB — PREGNANCY, URINE: PREG TEST UR: NEGATIVE

## 2016-02-05 MED ORDER — DIPHENHYDRAMINE HCL 50 MG/ML IJ SOLN
25.0000 mg | Freq: Once | INTRAMUSCULAR | Status: AC
  Administered 2016-02-05: 25 mg via INTRAVENOUS
  Filled 2016-02-05: qty 1

## 2016-02-05 MED ORDER — KETOROLAC TROMETHAMINE 30 MG/ML IJ SOLN
30.0000 mg | Freq: Once | INTRAMUSCULAR | Status: AC
  Administered 2016-02-05: 30 mg via INTRAVENOUS
  Filled 2016-02-05: qty 1

## 2016-02-05 MED ORDER — PROCHLORPERAZINE EDISYLATE 5 MG/ML IJ SOLN
5.0000 mg | Freq: Once | INTRAMUSCULAR | Status: AC
  Administered 2016-02-05: 5 mg via INTRAVENOUS
  Filled 2016-02-05: qty 2

## 2016-02-05 MED ORDER — SODIUM CHLORIDE 0.9 % IV BOLUS (SEPSIS)
1000.0000 mL | Freq: Once | INTRAVENOUS | Status: AC
  Administered 2016-02-05: 1000 mL via INTRAVENOUS

## 2016-02-05 NOTE — ED Provider Notes (Signed)
CSN: [id number]     Arrival date & time 02/05/16  1948 History   First MD Initiated Contact with Patient 02/05/16 2008     Chief Complaint  Patient presents with  . Migraine     (Consider location/radiation/quality/duration/timing/severity/associated sxs/prior Treatment) HPI Comments:  Tammy Mejia is a 25 y.o. female who complains of migraine headache for 2 day(s). She has a well established history of recurrent migraines. Description of pain: throbbing pain, global. Associated symptoms: nausea and photophobia, phonophobia. Patient has already taken motrin for this headache without relief. There are no associated abnormal neurological symptoms such as TIA's, loss of balance, loss of vision or speech, numbness or weakness on review. Past neurological history: negative for stroke, MS, epilepsy, or brain tumor.     Patient is a 25 y.o. female presenting with migraines. The history is provided by the patient.  Migraine This is a recurrent problem. The current episode started yesterday. The problem occurs constantly. The problem has been gradually worsening. Associated symptoms include anorexia, headaches and nausea. Pertinent negatives include no abdominal pain, arthralgias, change in bowel habit, chest pain, chills, congestion, coughing, diaphoresis, fatigue, fever, joint swelling, myalgias, neck pain, numbness, rash, sore throat, swollen glands, urinary symptoms, vertigo, visual change, vomiting or weakness. She has tried NSAIDs for the symptoms. The treatment provided no relief.    Past Medical History  Diagnosis Date  . Deficiency of potassium   . Migraine   . Hypokalemia   . Back pain    History reviewed. No pertinent past surgical history. Family History  Problem Relation Age of Onset  . COPD Mother    Social History  Substance Use Topics  . Smoking status: Never Smoker   . Smokeless tobacco: Never Used  . Alcohol Use: No   OB History    No data available      Review of Systems  Constitutional: Negative for fever, chills, diaphoresis and fatigue.  HENT: Negative for congestion and sore throat.   Respiratory: Negative for cough.   Cardiovascular: Negative for chest pain.  Gastrointestinal: Positive for nausea and anorexia. Negative for vomiting, abdominal pain and change in bowel habit.  Musculoskeletal: Negative for myalgias, joint swelling, arthralgias and neck pain.  Skin: Negative for rash.  Neurological: Positive for headaches. Negative for vertigo, weakness and numbness.  All other systems reviewed and are negative.     Allergies  Review of patient's allergies indicates no known allergies.  Home Medications   Prior to Admission medications   Medication Sig Start Date End Date Taking? Authorizing Provider  eletriptan (RELPAX) 40 MG tablet Take 1 tablet (40 mg total) by mouth as needed for migraine or headache. May repeat in 2 hours if headache persists or recurs. 07/24/15   Sheliah Hatch, MD  loratadine (CLARITIN) 10 MG tablet Take 1 tablet (10 mg total) by mouth daily. 06/30/15   Ramon Dredge Saguier, PA-C  topiramate (TOPAMAX) 25 MG tablet Start 1 tab QHS x1 week, then 1 tab twice daily x1 week, then 1 tab qAM and 2 tabs QHS x1 week, and then 2 tabs twice daily 05/15/15   Sheliah Hatch, MD  traZODone (DESYREL) 50 MG tablet TAKE 1/2 TO 1 TABLET BY MOUTH AT BEDTIME AS NEEDED FOR SLEEP 08/25/15   Sheliah Hatch, MD   BP 117/75 mmHg  Pulse 99  Temp(Src) 98.2 F (36.8 C) (Oral)  Resp 18  Wt 47.628 kg  SpO2 100% Physical Exam  Constitutional: She is oriented to person, place, and  time. She appears well-developed and well-nourished. No distress.  HENT:  Head: Normocephalic and atraumatic.  Mouth/Throat: Oropharynx is clear and moist.  Eyes: Conjunctivae and EOM are normal. Pupils are equal, round, and reactive to light. No scleral icterus.  No horizontal, vertical or rotational nystagmus  Neck: Normal range of motion. Neck  supple.  Full active and passive ROM without pain No midline or paraspinal tenderness No nuchal rigidity or meningeal signs  Cardiovascular: Normal rate, regular rhythm and intact distal pulses.   Pulmonary/Chest: Effort normal and breath sounds normal. No respiratory distress. She has no wheezes. She has no rales.  Abdominal: Soft. Bowel sounds are normal. There is no tenderness. There is no rebound and no guarding.  Musculoskeletal: Normal range of motion.  Lymphadenopathy:    She has no cervical adenopathy.  Neurological: She is alert and oriented to person, place, and time. She has normal reflexes. No cranial nerve deficit. She exhibits normal muscle tone. Coordination normal.  Mental Status:  Alert, oriented, thought content appropriate. Speech fluent without evidence of aphasia. Able to follow 2 step commands without difficulty.  Cranial Nerves:  II:  Peripheral visual fields grossly normal, pupils equal, round, reactive to light III,IV, VI: ptosis not present, extra-ocular motions intact bilaterally  V,VII: smile symmetric, facial light touch sensation equal VIII: hearing grossly normal bilaterally  IX,X: midline uvula rise  XI: bilateral shoulder shrug equal and strong XII: midline tongue extension  Motor:  5/5 in upper and lower extremities bilaterally including strong and equal grip strength and dorsiflexion/plantar flexion Sensory: Pinprick and light touch normal in all extremities.  Deep Tendon Reflexes: 2+ and symmetric  Cerebellar: normal finger-to-nose with bilateral upper extremities Gait: normal gait and balance CV: distal pulses palpable throughout   Skin: Skin is warm and dry. No rash noted. She is not diaphoretic.  Psychiatric: She has a normal mood and affect. Her behavior is normal. Judgment and thought content normal.  Nursing note and vitals reviewed.   ED Course  Procedures (including critical care time) Labs Review Labs Reviewed - No data to  display  Imaging Review No results found. I have personally reviewed and evaluated these images and lab results as part of my medical decision-making.   EKG Interpretation None      MDM   Final diagnoses:  Bad headache   Pt HA treated and improved while in ED.  Presentation is like pts typical HA and non concerning [facility], ICH, Meningitis, or temporal arteritis. Pt is afebrile with no focal neuro deficits, nuchal rigidity, or change in vision. Pt is to follow up with PCP to discuss prophylactic medication. Pt verbalizes understanding and is agreeable with plan to dc.       Arthor Captainbigail Ali Mclaurin, PA-C 02/05/16 2227  Lyndal Pulleyaniel Knott, MD 02/06/16 0157

## 2016-02-05 NOTE — Discharge Instructions (Signed)
You are having a headache. No specific cause was found today for your headache. It may have been a migraine or other cause of headache. Stress, anxiety, fatigue, and depression are common triggers for headaches. Your headache today does not appear to be life-threatening or require hospitalization, but often the exact cause of headaches is not determined in the emergency department. Therefore, follow-up with your doctor is very important to find out what may have caused your headache, and whether or not you need any further diagnostic testing or treatment. Sometimes headaches can appear benign (not harmful), but then more serious symptoms can develop which should prompt an immediate re-evaluation by your doctor or the emergency department. SEEK MEDICAL ATTENTION IF: You develop possible problems with medications prescribed.  The medications don't resolve your headache, if it recurs , or if you have multiple episodes of vomiting or can't take fluids. You have a change from the usual headache. RETURN IMMEDIATELY IF you develop a sudden, severe headache or confusion, become poorly responsive or faint, develop a fever above 100.63F or problem breathing, have a change in speech, vision, swallowing, or understanding, or develop new weakness, numbness, tingling, incoordination, or have a seizure. Recurrent Migraine Headache A migraine headache is an intense, throbbing pain on one or both sides of your head. Recurrent migraines keep coming back. A migraine can last for 30 minutes to several hours. CAUSES  The exact cause of a migraine headache is not always known. However, a migraine may be caused when nerves in the brain become irritated and release chemicals that cause inflammation. This causes pain. Certain things may also trigger migraines, such as:   Alcohol.  Smoking.  Stress.  Menstruation.  Aged cheeses.  Foods or drinks that contain nitrates, glutamate, aspartame, or tyramine.  Lack of  sleep.  Chocolate.  Caffeine.  Hunger.  Physical exertion.  Fatigue.  Medicines used to treat chest pain (nitroglycerine), birth control pills, estrogen, and some blood pressure medicines. SYMPTOMS   Pain on one or both sides of your head.  Pulsating or throbbing pain.  Severe pain that prevents daily activities.  Pain that is aggravated by any physical activity.  Nausea, vomiting, or both.  Dizziness.  Pain with exposure to bright lights, loud noises, or activity.  General sensitivity to bright lights, loud noises, or smells. Before you get a migraine, you may get warning signs that a migraine is coming (aura). An aura may include:  Seeing flashing lights.  Seeing bright spots, halos, or zigzag lines.  Having tunnel vision or blurred vision.  Having feelings of numbness or tingling.  Having trouble talking.  Having muscle weakness. DIAGNOSIS  A recurrent migraine headache is often diagnosed based on:  Symptoms.  Physical examination.  A CT scan or MRI of your head. These imaging tests cannot diagnose migraines but can help rule out other causes of headaches.  TREATMENT  Medicines may be given for pain and nausea. Medicines can also be given to help prevent recurrent migraines. HOME CARE INSTRUCTIONS  Only take over-the-counter or prescription medicines for pain or discomfort as directed by your health care provider. The use of long-term narcotics is not recommended.  Lie down in a dark, quiet room when you have a migraine.  Keep a journal to find out what may trigger your migraine headaches. For example, write down:  What you eat and drink.  How much sleep you get.  Any change to your diet or medicines.  Limit alcohol consumption.  Quit smoking if you  smoke.  Get 7-9 hours of sleep, or as recommended by your health care provider.  Limit stress.  Keep lights dim if bright lights bother you and make your migraines worse. SEEK MEDICAL CARE  IF:   You do not get relief from the medicines given to you.  You have a recurrence of pain.  You have a fever. SEEK IMMEDIATE MEDICAL CARE IF:  Your migraine becomes severe.  You have a stiff neck.  You have loss of vision.  You have muscular weakness or loss of muscle control.  You start losing your balance or have trouble walking.  You feel faint or pass out.  You have severe symptoms that are different from your first symptoms. MAKE SURE YOU:   Understand these instructions.  Will watch your condition.  Will get help right away if you are not doing well or get worse.   This information is not intended to replace advice given to you by your health care provider. Make sure you discuss any questions you have with your health care provider.   Document Released: 06/01/2001 Document Revised: 09/27/2014 Document Reviewed: 05/14/2013 Elsevier Interactive Patient Education [employer]

## 2016-02-05 NOTE — ED Notes (Signed)
Headache x 2 days

## 2016-02-05 NOTE — ED Notes (Signed)
Pt made aware to return if symptoms worsen or if any life threatening symptoms occur.   

## 2016-03-16 ENCOUNTER — Encounter: Payer: Self-pay | Admitting: General Practice

## 2016-03-16 ENCOUNTER — Ambulatory Visit (INDEPENDENT_AMBULATORY_CARE_PROVIDER_SITE_OTHER): Payer: BLUE CROSS/BLUE SHIELD | Admitting: Family Medicine

## 2016-03-16 ENCOUNTER — Encounter: Payer: Self-pay | Admitting: Family Medicine

## 2016-03-16 VITALS — BP 105/68 | HR 71 | Temp 98.0°F | Resp 16 | Ht 61.0 in | Wt 105.2 lb

## 2016-03-16 DIAGNOSIS — F329 Major depressive disorder, single episode, unspecified: Secondary | ICD-10-CM | POA: Insufficient documentation

## 2016-03-16 DIAGNOSIS — G4700 Insomnia, unspecified: Secondary | ICD-10-CM

## 2016-03-16 DIAGNOSIS — T2220XA Burn of second degree of shoulder and upper limb, except wrist and hand, unspecified site, initial encounter: Secondary | ICD-10-CM | POA: Diagnosis not present

## 2016-03-16 DIAGNOSIS — Z309 Encounter for contraceptive management, unspecified: Secondary | ICD-10-CM | POA: Diagnosis not present

## 2016-03-16 DIAGNOSIS — F418 Other specified anxiety disorders: Secondary | ICD-10-CM

## 2016-03-16 DIAGNOSIS — F419 Anxiety disorder, unspecified: Secondary | ICD-10-CM

## 2016-03-16 DIAGNOSIS — Z3009 Encounter for other general counseling and advice on contraception: Secondary | ICD-10-CM

## 2016-03-16 MED ORDER — TRAZODONE HCL 50 MG PO TABS: 25.0000 mg | ORAL_TABLET | Freq: Every evening | ORAL | Status: DC | PRN

## 2016-03-16 MED ORDER — ESCITALOPRAM OXALATE 10 MG PO TABS
10.0000 mg | ORAL_TABLET | Freq: Every day | ORAL | Status: DC
Start: 2016-03-16 — End: 2017-03-18

## 2016-03-16 MED ORDER — NORGESTIMATE-ETH ESTRADIOL 0.25-35 MG-MCG PO TABS
1.0000 | ORAL_TABLET | Freq: Every day | ORAL | Status: DC
Start: 2016-03-16 — End: 2016-10-12
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
## 2016-03-16 NOTE — Assessment & Plan Note (Signed)
New.  Pt is asking for medication to improve her sxs b/c she reports she has never had sxs like this- decreased concentration, low libido, poor sleep.  Reviewed need for stress management.  Start low dose SSRI and monitor for improvement.  Pt expressed understanding and is in agreement w/ plan.
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
## 2016-03-16 NOTE — Progress Notes (Signed)
Pre visit review using our clinic review tool, if applicable. No additional management support is needed unless otherwise documented below in the visit note. 
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
## 2016-03-16 NOTE — Assessment & Plan Note (Signed)
Recurring problem.  Restart Trazodone.  Pt expressed understanding and is in agreement w/ plan.

## 2016-03-30 ENCOUNTER — Ambulatory Visit (INDEPENDENT_AMBULATORY_CARE_PROVIDER_SITE_OTHER): Payer: BLUE CROSS/BLUE SHIELD | Admitting: Family

## 2016-03-30 ENCOUNTER — Encounter: Payer: Self-pay | Admitting: Family

## 2016-03-30 VITALS — BP 110/50 | HR 79 | Temp 98.1°F | Ht 61.0 in | Wt 107.8 lb

## 2016-03-30 DIAGNOSIS — R110 Nausea: Secondary | ICD-10-CM | POA: Diagnosis not present

## 2016-03-30 MED ORDER — ONDANSETRON HCL 4 MG PO TABS: 4.0000 mg | ORAL_TABLET | Freq: Three times a day (TID) | ORAL | Status: DC | PRN

## 2016-03-30 NOTE — Patient Instructions (Signed)
Please complete lab work prior to leaving. For nausea you may use zofran as needed. Make sure to drink plenty of fluids. Please call if new or worsening symptoms or if symptoms are not improved by the end of the weekend.

## 2016-03-30 NOTE — Progress Notes (Signed)
Pre visit review using our clinic review tool, if applicable. No additional management support is needed unless otherwise documented below in the visit note. 

## 2016-03-30 NOTE — Progress Notes (Signed)
Subjective:    Patient ID: Tammy SimsAshley B Mejia, female    DOB: 02/06/1991, 25 y.o.   MRN: [id number]  HPI  Tammy Sheldonshley is a 25 yr old female who presents today with chief complaint of nausea.  Nausea began on Sunday. No vomiting.  LMP was in the end of June.  Denies abdominal pain, dysuria/frequency/urgency. Reports good PO intake.   Review of Systems See HPI  Past Medical History  Diagnosis Date  . Deficiency of potassium   . Migraine   . Hypokalemia   . Back pain      Social History   Social History  . Marital Status: Single    Spouse Name: N/A  . Number of Children: N/A  . Years of Education: N/A   Occupational History  . Not on file.   Social History Main Topics  . Smoking status: Never Smoker   . Smokeless tobacco: Never Used  . Alcohol Use: No  . Drug Use: No  . Sexual Activity: Yes    Birth Control/ Protection: Injection   Other Topics Concern  . Not on file   Social History Narrative    No past surgical history on file.  Family History  Problem Relation Age of Onset  . COPD Mother     No Known Allergies  Current Outpatient Prescriptions on File Prior to Visit  Medication Sig Dispense Refill  . eletriptan (RELPAX) 40 MG tablet Take 1 tablet (40 mg total) by mouth as needed for migraine or headache. May repeat in 2 hours if headache persists or recurs. 10 tablet 6  . escitalopram (LEXAPRO) 10 MG tablet Take 1 tablet (10 mg total) by mouth daily. 30 tablet 3  . loratadine (CLARITIN) 10 MG tablet Take 1 tablet (10 mg total) by mouth daily. 30 tablet 0  . norgestimate-ethinyl estradiol (SPRINTEC 28) 0.25-35 MG-MCG tablet Take 1 tablet by mouth daily. 1 Package 11  . topiramate (TOPAMAX) 25 MG tablet Start 1 tab QHS x1 week, then 1 tab twice daily x1 week, then 1 tab qAM and 2 tabs QHS x1 week, and then 2 tabs twice daily 120 tablet 3  . traZODone (DESYREL) 50 MG tablet Take 0.5-1 tablets (25-50 mg total) by mouth at bedtime as needed. for sleep 30 tablet 3     No current facility-administered medications on file prior to visit.    BP 110/50 mmHg  Pulse 79  Temp(Src) 98.1 F (36.7 C) (Oral)  Ht 5\' 1"  (1.549 m)  Wt 107 lb 12.8 oz (48.898 kg)  BMI 20.38 kg/m2  SpO2 99%  LMP 03/15/2016       Objective:   Physical Exam  Constitutional: She is oriented to person, place, and time. She appears well-developed and well-nourished.  HENT:  Head: Normocephalic and atraumatic.  Cardiovascular: Normal rate, regular rhythm and normal heart sounds.   No murmur heard. Pulmonary/Chest: Effort normal and breath sounds normal. No respiratory distress. She has no wheezes.  Abdominal: Soft. Bowel sounds are normal. She exhibits no distension. There is no tenderness. There is no rebound.  Musculoskeletal: She exhibits no edema.  Neurological: She is alert and oriented to person, place, and time.  Psychiatric: She has a normal mood and affect. Her behavior is normal. Judgment and thought content normal.          Assessment & Plan:  Nausea without vomiting- suspect viral gastroenteritis. Obtain labs as below to further evaluate including urine HCG. Add zofran prn nausea. Pt is advised to call if  symptoms worsen or do not improve.

## 2016-03-31 LAB — CBC WITH DIFFERENTIAL/PLATELET
BASOS ABS: 0.1 10*3/uL (ref 0.0–0.1)
BASOS PCT: 1.2 % (ref 0.0–3.0)
EOS ABS: 0.1 10*3/uL (ref 0.0–0.7)
Eosinophils Relative: 2.1 % (ref 0.0–5.0)
HEMATOCRIT: 37.6 % (ref 36.0–46.0)
HEMOGLOBIN: 12.5 g/dL (ref 12.0–15.0)
LYMPHS PCT: 28.2 % (ref 12.0–46.0)
Lymphs Abs: 1.7 10*3/uL (ref 0.7–4.0)
MCHC: 33.3 g/dL (ref 30.0–36.0)
MCV: 86.9 fl (ref 78.0–100.0)
MONO ABS: 0.7 10*3/uL (ref 0.1–1.0)
Monocytes Relative: 12 % (ref 3.0–12.0)
Neutro Abs: 3.5 10*3/uL (ref 1.4–7.7)
Neutrophils Relative %: 56.5 % (ref 43.0–77.0)
Platelets: 259 10*3/uL (ref 150.0–400.0)
RBC: 4.33 Mil/uL (ref 3.87–5.11)
RDW: 13.6 % (ref 11.5–15.5)
WBC: 6.1 10*3/uL (ref 4.0–10.5)

## 2016-03-31 LAB — COMPREHENSIVE METABOLIC PANEL
ALBUMIN: 3.8 g/dL (ref 3.5–5.2)
ALK PHOS: 51 U/L (ref 39–117)
ALT: 7 U/L (ref 0–35)
AST: 13 U/L (ref 0–37)
BILIRUBIN TOTAL: 0.2 mg/dL (ref 0.2–1.2)
BUN: 6 mg/dL (ref 6–23)
CALCIUM: 8.8 mg/dL (ref 8.4–10.5)
CO2: 26 mEq/L (ref 19–32)
CREATININE: 0.68 mg/dL (ref 0.40–1.20)
Chloride: 108 mEq/L (ref 96–112)
GFR: 112.19 mL/min (ref >60.00)
Glucose, Bld: 71 mg/dL (ref 70–99)
Potassium: 3.7 mEq/L (ref 3.5–5.1)
SODIUM: 141 meq/L (ref 135–145)
TOTAL PROTEIN: 6.9 g/dL (ref 6.0–8.3)

## 2016-03-31 LAB — PREGNANCY, URINE: PREG TEST UR: NEGATIVE

## 2016-04-12 ENCOUNTER — Ambulatory Visit: Payer: BLUE CROSS/BLUE SHIELD | Admitting: Family Medicine

## 2016-04-12 DIAGNOSIS — Z0289 Encounter for other administrative examinations: Secondary | ICD-10-CM

## 2016-05-28 ENCOUNTER — Ambulatory Visit (INDEPENDENT_AMBULATORY_CARE_PROVIDER_SITE_OTHER): Payer: BLUE CROSS/BLUE SHIELD | Admitting: Family Medicine

## 2016-05-28 ENCOUNTER — Encounter: Payer: Self-pay | Admitting: Family Medicine

## 2016-05-28 VITALS — BP 90/68 | HR 71 | Temp 98.5°F | Ht 61.0 in | Wt 106.2 lb

## 2016-05-28 DIAGNOSIS — G43109 Migraine with aura, not intractable, without status migrainosus: Secondary | ICD-10-CM | POA: Diagnosis not present

## 2016-05-28 MED ORDER — KETOROLAC TROMETHAMINE 60 MG/2ML IM SOLN
60.0000 mg | Freq: Once | INTRAMUSCULAR | Status: AC
  Administered 2016-05-28: 60 mg via INTRAMUSCULAR

## 2016-05-28 NOTE — Patient Instructions (Addendum)
No "NSAIDS" (Motrin/ibuprofen, Aleve, Advil, etc) for the rest of the day. Tylenol OK. Migraine Headache A migraine headache is an intense, throbbing pain on one or both sides of your head. A migraine can last for 30 minutes to several hours. CAUSES  The exact cause of a migraine headache is not always known. However, a migraine may be caused when nerves in the brain become irritated and release chemicals that cause inflammation. This causes pain. Certain things may also trigger migraines, such as:  Alcohol.  Smoking.  Stress.  Menstruation.  Aged cheeses.  Foods or drinks that contain nitrates, glutamate, aspartame, or tyramine.  Lack of sleep.  Chocolate.  Caffeine.  Hunger.  Physical exertion.  Fatigue.  Medicines used to treat chest pain (nitroglycerine), birth control pills, estrogen, and some blood pressure medicines. SIGNS AND SYMPTOMS  Pain on one or both sides of your head.  Pulsating or throbbing pain.  Severe pain that prevents daily activities.  Pain that is aggravated by any physical activity.  Nausea, vomiting, or both.  Dizziness.  Pain with exposure to bright lights, loud noises, or activity.  General sensitivity to bright lights, loud noises, or smells. Before you get a migraine, you may get warning signs that a migraine is coming (aura). An aura may include:  Seeing flashing lights.  Seeing bright spots, halos, or zigzag lines.  Having tunnel vision or blurred vision.  Having feelings of numbness or tingling.  Having trouble talking.  Having muscle weakness. DIAGNOSIS  A migraine headache is often diagnosed based on:  Symptoms.  Physical exam.  A CT scan or MRI of your head. These imaging tests cannot diagnose migraines, but they can help rule out other causes of headaches. TREATMENT Medicines may be given for pain and nausea. Medicines can also be given to help prevent recurrent migraines.  HOME CARE INSTRUCTIONS  Only take  over-the-counter or prescription medicines for pain or discomfort as directed by your health care provider. The use of long-term narcotics is not recommended.  Lie down in a dark, quiet room when you have a migraine.  Keep a journal to find out what may trigger your migraine headaches. For example, write down:  What you eat and drink.  How much sleep you get.  Any change to your diet or medicines.  Limit alcohol consumption.  Quit smoking if you smoke.  Get 7-9 hours of sleep, or as recommended by your health care provider.  Limit stress.  Keep lights dim if bright lights bother you and make your migraines worse. SEEK IMMEDIATE MEDICAL CARE IF:   Your migraine becomes severe.  You have a fever.  You have a stiff neck.  You have vision loss.  You have muscular weakness or loss of muscle control.  You start losing your balance or have trouble walking.  You feel faint or pass out.  You have severe symptoms that are different from your first symptoms. MAKE SURE YOU:   Understand these instructions.  Will watch your condition.  Will get help right away if you are not doing well or get worse.   This information is not intended to replace advice given to you by your health care provider. Make sure you discuss any questions you have with your health care provider.   Document Released: 09/06/2005 Document Revised: 09/27/2014 Document Reviewed: 05/14/2013 Elsevier Interactive Patient Education [employer] Inc[employer]

## 2016-05-28 NOTE — Progress Notes (Signed)
Chief Complaint  Patient presents with  . Migraine    since yest    Subjective: Patient is a 25 y.o. female here for a migraine. Here with mom.  This has been going on since yesterday. +hx of migraines. She is on Topamax daily in addition to Relpax for abortive therapy. Her Relpax did not help this time and she came in. She does get vision changes with her migraines. She does currently have a headache. No numbness, tingling, or weakness. Toradol has worked well in the past.   ROS: Neuro: As noted in HPI  Family History  Problem Relation Age of Onset  . COPD Mother    Past Medical History:  Diagnosis Date  . Back pain   . Deficiency of potassium   . Hypokalemia   . Migraine    No Known Allergies  Current Outpatient Prescriptions:  .  eletriptan (RELPAX) 40 MG tablet, Take 1 tablet (40 mg total) by mouth as needed for migraine or headache. May repeat in 2 hours if headache persists or recurs., Disp: 10 tablet, Rfl: 6 .  escitalopram (LEXAPRO) 10 MG tablet, Take 1 tablet (10 mg total) by mouth daily., Disp: 30 tablet, Rfl: 3 .  loratadine (CLARITIN) 10 MG tablet, Take 1 tablet (10 mg total) by mouth daily., Disp: 30 tablet, Rfl: 0 .  norgestimate-ethinyl estradiol (SPRINTEC 28) 0.25-35 MG-MCG tablet, Take 1 tablet by mouth daily., Disp: 1 Package, Rfl: 11 .  ondansetron (ZOFRAN) 4 MG tablet, Take 1 tablet (4 mg total) by mouth every 8 (eight) hours as needed for nausea or vomiting., Disp: 20 tablet, Rfl: 0 .  topiramate (TOPAMAX) 25 MG tablet, Start 1 tab QHS x1 week, then 1 tab twice daily x1 week, then 1 tab qAM and 2 tabs QHS x1 week, and then 2 tabs twice daily, Disp: 120 tablet, Rfl: 3 .  traZODone (DESYREL) 50 MG tablet, Take 0.5-1 tablets (25-50 mg total) by mouth at bedtime as needed. for sleep, Disp: 30 tablet, Rfl: 3  Objective: BP 90/68 (BP Location: Left Arm, Patient Position: Sitting, Cuff Size: Normal)   Pulse 71   Temp 98.5 F (36.9 C) (Oral)   Ht 5\' 1"  (1.549 m)    Wt 106 lb 3.2 oz (48.2 kg)   LMP 05/14/2016 (Exact Date)   SpO2 98%   BMI 20.07 kg/m  General: Awake, appears stated age HEENT: MMM, EOMi, PERRLA Heart: RRR, no murmurs Lungs: CTAB, no rales, wheezes or rhonchi. Normal effort Neuro : No cerebellar signs, DTR's 1/4 calcaneal and biceps b/l wo clonus, 2/4 patellar b/l wo clonus. Gait normal. Psych: Age appropriate judgment and insight, normal affect and mood  Assessment and Plan: Migraine with aura and without status migrainosus, not intractable  Orders as above. Continue current prophylactic therapy. No NSAIDs for the rest of the day. Letter for work given. F/u prn. The patient voiced understanding and agreement to the plan.  Jilda Rocheicholas Paul El PortalWendling, DO 05/28/16  2:34 PM

## 2016-05-28 NOTE — Addendum Note (Signed)
Addended by: Verdie ShireBAYNES, ANGELA M on: 05/28/2016 02:53 PM   Modules accepted: Orders

## 2016-05-28 NOTE — Progress Notes (Signed)
Pre visit review using our clinic review tool, if applicable. No additional management support is needed unless otherwise documented below in the visit note. 

## 2016-06-03 ENCOUNTER — Ambulatory Visit: Payer: BLUE CROSS/BLUE SHIELD | Admitting: Family Medicine

## 2016-06-03 ENCOUNTER — Encounter: Payer: Self-pay | Admitting: Family Medicine

## 2016-06-03 DIAGNOSIS — Z0289 Encounter for other administrative examinations: Secondary | ICD-10-CM

## 2016-06-21 ENCOUNTER — Ambulatory Visit (INDEPENDENT_AMBULATORY_CARE_PROVIDER_SITE_OTHER): Payer: BLUE CROSS/BLUE SHIELD | Admitting: Family Medicine

## 2016-06-21 VITALS — BP 100/62 | HR 86 | Temp 98.5°F | Wt 104.4 lb

## 2016-06-21 DIAGNOSIS — R5381 Other malaise: Secondary | ICD-10-CM | POA: Diagnosis not present

## 2016-06-21 DIAGNOSIS — R52 Pain, unspecified: Secondary | ICD-10-CM

## 2016-06-21 DIAGNOSIS — J029 Acute pharyngitis, unspecified: Secondary | ICD-10-CM

## 2016-06-21 DIAGNOSIS — N946 Dysmenorrhea, unspecified: Secondary | ICD-10-CM | POA: Diagnosis not present

## 2016-06-21 LAB — CBC
HCT: 37.7 % (ref 36.0–46.0)
HEMOGLOBIN: 12.8 g/dL (ref 12.0–15.0)
MCHC: 33.8 g/dL (ref 30.0–36.0)
MCV: 87.2 fl (ref 78.0–100.0)
PLATELETS: 271 10*3/uL (ref 150.0–400.0)
RBC: 4.32 Mil/uL (ref 3.87–5.11)
RDW: 13.6 % (ref 11.5–15.5)
WBC: 8.1 10*3/uL (ref 4.0–10.5)

## 2016-06-21 LAB — POCT INFLUENZA A/B
INFLUENZA B, POC: NEGATIVE
Influenza A, POC: NEGATIVE

## 2016-06-21 LAB — POCT RAPID STREP A (OFFICE): Rapid Strep A Screen: NEGATIVE

## 2016-06-21 NOTE — Patient Instructions (Signed)
I am sorry that you are not feeling well!  We will check a blood count and throat culture, but you likely have the bad cold that is going around right now. Use ibuprofen/ tylenol as needed for aches and generally feeling bad.   You might consider starting ibuprofen (maybe 400 mg three times a day) 2 days prior to the expected start of your periods.  This can be helpful for some women. You might also consider getting an IUD so that you would not have periods at all!
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
## 2016-06-22 LAB — CULTURE, GROUP A STREP: ORGANISM ID, BACTERIA: NORMAL

## 2016-07-12 ENCOUNTER — Encounter: Payer: Self-pay | Admitting: *Deleted

## 2016-07-12 ENCOUNTER — Encounter: Payer: Self-pay | Admitting: Physician Assistant

## 2016-07-12 ENCOUNTER — Ambulatory Visit (INDEPENDENT_AMBULATORY_CARE_PROVIDER_SITE_OTHER): Payer: BLUE CROSS/BLUE SHIELD | Admitting: Physician Assistant

## 2016-07-12 VITALS — BP 96/62 | HR 110 | Temp 98.0°F | Resp 16 | Ht 61.0 in | Wt 103.2 lb

## 2016-07-12 DIAGNOSIS — N946 Dysmenorrhea, unspecified: Secondary | ICD-10-CM

## 2016-07-12 MED ORDER — MEFENAMIC ACID 250 MG PO CAPS: 250.0000 mg | ORAL_CAPSULE | Freq: Four times a day (QID) | ORAL | 0 refills | Status: DC | PRN

## 2016-07-12 NOTE — Progress Notes (Signed)
Patient presents to clinic today c/o painful cramps during menstrual periods. Patient endorses dealing with this problem since menarche. Has been prescribed a number or oral contraceptives without lasting relief of symptoms. Is currently on menstrual period. Denies heavy flow. Cramping is bilateral and in lower abdomen/pelvic region only. Denies nausea or vomiting. Denies hx of anemia. Patient taking Midol with little relief. Is requesting referral to GYN for further management and potential IUD.  Past Medical History:  Diagnosis Date  . Back pain   . Deficiency of potassium   . Hypokalemia   . Migraine     Current Outpatient Prescriptions on File Prior to Visit  Medication Sig Dispense Refill  . eletriptan (RELPAX) 40 MG tablet Take 1 tablet (40 mg total) by mouth as needed for migraine or headache. May repeat in 2 hours if headache persists or recurs. 10 tablet 6  . escitalopram (LEXAPRO) 10 MG tablet Take 1 tablet (10 mg total) by mouth daily. 30 tablet 3  . norgestimate-ethinyl estradiol (SPRINTEC 28) 0.25-35 MG-MCG tablet Take 1 tablet by mouth daily. 1 Package 11  . topiramate (TOPAMAX) 25 MG tablet Start 1 tab QHS x1 week, then 1 tab twice daily x1 week, then 1 tab qAM and 2 tabs QHS x1 week, and then 2 tabs twice daily (Patient taking differently: Take 50 mg by mouth 2 (two) times daily. Start 1 tab QHS x1 week, then 1 tab twice daily x1 week, then 1 tab qAM and 2 tabs QHS x1 week, and then 2 tabs twice daily) 120 tablet 3  . traZODone (DESYREL) 50 MG tablet Take 0.5-1 tablets (25-50 mg total) by mouth at bedtime as needed. for sleep 30 tablet 3   No current facility-administered medications on file prior to visit.     No Known Allergies  Family History  Problem Relation Age of Onset  . COPD Mother     Social History   Social History  . Marital status: Single    Spouse name: N/A  . Number of children: N/A  . Years of education: N/A   Social History Main Topics  .  Smoking status: Never Smoker  . Smokeless tobacco: Never Used  . Alcohol use No  . Drug use: No  . Sexual activity: Yes    Birth control/ protection: Injection   Other Topics Concern  . None   Social History Narrative  . None   Review of Systems - See HPI.  All other ROS are negative.  BP 96/62 (BP Location: Left Arm, Patient Position: Sitting, Cuff Size: Normal)   Pulse (!) 110   Temp 98 F (36.7 C) (Oral)   Resp 16   Ht 5\' 1"  (1.549 m)   Wt 103 lb 4 oz (46.8 kg)   LMP 07/11/2016   SpO2 97%   BMI 19.51 kg/m   Physical Exam  Constitutional: She is oriented to person, place, and time and well-developed, well-nourished, and in no distress.  HENT:  Head: Normocephalic and atraumatic.  Cardiovascular: Normal rate, regular rhythm, normal heart sounds and intact distal pulses.   Pulmonary/Chest: Effort normal and breath sounds normal. No respiratory distress. She has no wheezes. She has no rales. She exhibits no tenderness.  Abdominal: Soft. Bowel sounds are normal. She exhibits no distension. There is no tenderness.  Neurological: She is alert and oriented to person, place, and time.  Skin: Skin is warm and dry.  Psychiatric: Affect normal.  Vitals reviewed.   Recent Results (from the past  2160 hour(s))  POCT rapid strep A     Status: Normal   Collection Time: 06/21/16  9:29 AM  Result Value Ref Range   Rapid Strep A Screen Negative Negative  CBC     Status: None   Collection Time: 06/21/16  9:48 AM  Result Value Ref Range   WBC 8.1 4.0 - 10.5 K/uL   RBC 4.32 3.87 - 5.11 Mil/uL   Platelets 271.0 150.0 - 400.0 K/uL   Hemoglobin 12.8 12.0 - 15.0 g/dL   HCT 09.837.7 11.936.0 - 14.746.0 %   MCV 87.2 78.0 - 100.0 fl   MCHC 33.8 30.0 - 36.0 g/dL   RDW 82.913.6 56.211.5 - 13.015.5 %  Culture, Group A Strep     Status: None   Collection Time: 06/21/16  9:48 AM  Result Value Ref Range   Organism ID, Bacteria      Normal Upper Respiratory Flora No Beta Hemolytic Streptococci Isolated   POCT  Influenza A/B     Status: Normal   Collection Time: 06/21/16 11:08 AM  Result Value Ref Range   Influenza A, POC Negative Negative   Influenza B, POC Negative Negative    Assessment/Plan: 1. Dysmenorrhea Will start anti-inflammatory Rx for cramping. Supportive measures reviewed. Discussed different types of contraceptives to help with symptoms. Patient wishes to defer this decision to Gynecology. Referral placed. Patient encouraged to FU with PCP as scheduled. - Ambulatory referral to Gynecology   Piedad ClimesMartin, Tea Collums Cody, PA-C

## 2016-07-12 NOTE — Patient Instructions (Signed)
Please start the Mefenamic acid tablets as directed when needed for severe cramps. Take with food. No more than 3 days tops.  You will be contacted to schedule an appointment with GYN to discuss IUD and other contraceptives for symptoms relief.   Follow-up with your PCP as scheduled.  Dysmenorrhea Menstrual cramps (dysmenorrhea) are caused by the muscles of the uterus tightening (contracting) during a menstrual period. For some women, this discomfort is merely bothersome. For others, dysmenorrhea can be severe enough to interfere with everyday activities for a few days each month. Primary dysmenorrhea is menstrual cramps that last a couple of days when you start having menstrual periods or soon after. This often begins after a teenager starts having her period. As a woman gets older or has a baby, the cramps will usually lessen or disappear. Secondary dysmenorrhea begins later in life, lasts longer, and the pain may be stronger than primary dysmenorrhea. The pain may start before the period and last a few days after the period.  CAUSES  Dysmenorrhea is usually caused by an underlying problem, such as:  The tissue lining the uterus grows outside of the uterus in other areas of the body (endometriosis).  The endometrial tissue, which normally lines the uterus, is found in or grows into the muscular walls of the uterus (adenomyosis).  The pelvic blood vessels are engorged with blood just before the menstrual period (pelvic congestive syndrome).  Overgrowth of cells (polyps) in the lining of the uterus or cervix.  Falling down of the uterus (prolapse) because of loose or stretched ligaments.  Depression.  Bladder problems, infection, or inflammation.  Problems with the intestine, a tumor, or irritable bowel syndrome.  Cancer of the female organs or bladder.  A severely tipped uterus.  A very tight opening or closed cervix.  Noncancerous tumors of the uterus (fibroids).  Pelvic  inflammatory disease (PID).  Pelvic scarring (adhesions) from a previous surgery.  Ovarian cyst.  An intrauterine device (IUD) used for birth control. RISK FACTORS You may be at greater risk of dysmenorrhea if:  You are younger than age 25.  You started puberty early.  You have irregular or heavy bleeding.  You have never given birth.  You have a family history of this problem.  You are a smoker. SIGNS AND SYMPTOMS   Cramping or throbbing pain in your lower abdomen.  Headaches.  Lower back pain.  Nausea or vomiting.  Diarrhea.  Sweating or dizziness.  Loose stools. DIAGNOSIS  A diagnosis is based on your history, symptoms, physical exam, diagnostic tests, or procedures. Diagnostic tests or procedures may include:  Blood tests.  Ultrasonography.  An examination of the lining of the uterus (dilation and curettage, D&C).  An examination inside your abdomen or pelvis with a scope (laparoscopy).  X-rays.  CT scan.  MRI.  An examination inside the bladder with a scope (cystoscopy).  An examination inside the intestine or stomach with a scope (colonoscopy, gastroscopy). TREATMENT  Treatment depends on the cause of the dysmenorrhea. Treatment may include:  Pain medicine prescribed by your health care provider.  Birth control pills or an IUD with progesterone hormone in it.  Hormone replacement therapy.  Nonsteroidal anti-inflammatory drugs (NSAIDs). These may help stop the production of prostaglandins.  Surgery to remove adhesions, endometriosis, ovarian cyst, or fibroids.  Removal of the uterus (hysterectomy).  Progesterone shots to stop the menstrual period.  Cutting the nerves on the sacrum that go to the female organs (presacral neurectomy).  Electric current to  the sacral nerves (sacral nerve stimulation).  Antidepressant medicine.  Psychiatric therapy, counseling, or group therapy.  Exercise and physical therapy.  Meditation and yoga  therapy.  Acupuncture. HOME CARE INSTRUCTIONS   Only take over-the-counter or prescription medicines as directed by your health care provider.  Place a heating pad or hot water bottle on your lower back or abdomen. Do not sleep with the heating pad.  Use aerobic exercises, walking, swimming, biking, and other exercises to help lessen the cramping.  Massage to the lower back or abdomen may help.  Stop smoking.  Avoid alcohol and caffeine. SEEK MEDICAL CARE IF:   Your pain does not get better with medicine.  You have pain with sexual intercourse.  Your pain increases and is not controlled with medicines.  You have abnormal vaginal bleeding with your period.  You develop nausea or vomiting with your period that is not controlled with medicine. SEEK IMMEDIATE MEDICAL CARE IF:  You pass out.    This information is not intended to replace advice given to you by your health care provider. Make sure you discuss any questions you have with your health care provider.   Document Released: 09/06/2005 Document Revised: 05/09/2013 Document Reviewed: 02/22/2013 Elsevier Interactive Patient Education [employer]

## 2016-07-30 ENCOUNTER — Encounter: Payer: Self-pay | Admitting: Family Medicine

## 2016-07-30 ENCOUNTER — Ambulatory Visit (INDEPENDENT_AMBULATORY_CARE_PROVIDER_SITE_OTHER): Payer: BLUE CROSS/BLUE SHIELD | Admitting: Family Medicine

## 2016-07-30 VITALS — BP 100/64 | HR 102 | Temp 98.1°F | Ht 61.0 in | Wt 104.0 lb

## 2016-07-30 DIAGNOSIS — G43009 Migraine without aura, not intractable, without status migrainosus: Secondary | ICD-10-CM | POA: Diagnosis not present

## 2016-07-30 MED ORDER — RIZATRIPTAN BENZOATE 5 MG PO TABS: 5.0000 mg | ORAL_TABLET | ORAL | 0 refills | Status: DC | PRN

## 2016-07-30 NOTE — Patient Instructions (Addendum)
You can start taking magnesium 400-500 mg daily to help prevent migraines.  No NSAIDs (motrin, ibuprofen, Aleve, etc) for rest of today. Tylenol is OK.  Stop taking Relpax and Topamax.

## 2016-07-30 NOTE — Progress Notes (Signed)
Chief Complaint  Patient presents with  . Migraine    Pt reports that it started 2 days ago and she has senstivity to light,     Subjective: Patient is a 25 y.o. female here for migraine.  +hx of migraines, this one has lasted 2 days. Some nausea, sensitivity to light and sound, headache. She was on Topamax, but stopped taking it because it stopped working. She was recently rx'd Relpax, but notes it is very expensive. Failed Imitrex 2/2 side effects. No aura a/w HA's.   ROS: Neuro: +HA  Family History  Problem Relation Age of Onset  . COPD Mother    Past Medical History:  Diagnosis Date  . Back pain   . Deficiency of potassium   . Hypokalemia   . Migraine    No Known Allergies  Current Outpatient Prescriptions:  .  escitalopram (LEXAPRO) 10 MG tablet, Take 1 tablet (10 mg total) by mouth daily., Disp: 30 tablet, Rfl: 3 .  Mefenamic Acid 250 MG CAPS, Take 1 capsule (250 mg total) by mouth every 6 (six) hours as needed. 3 days max, Disp: 20 each, Rfl: 0 .  norgestimate-ethinyl estradiol (SPRINTEC 28) 0.25-35 MG-MCG tablet, Take 1 tablet by mouth daily., Disp: 1 Package, Rfl: 11 .  traZODone (DESYREL) 50 MG tablet, Take 0.5-1 tablets (25-50 mg total) by mouth at bedtime as needed. for sleep, Disp: 30 tablet, Rfl: 3 .  rizatriptan (MAXALT) 5 MG tablet, Take 1 tablet (5 mg total) by mouth as needed for migraine. May repeat in 2 hours if needed, Disp: 10 tablet, Rfl: 0  Objective: BP 100/64 (BP Location: Right Arm, Patient Position: Sitting, Cuff Size: Normal)   Pulse (!) 102   Temp 98.1 F (36.7 C) (Oral)   Ht 5\' 1"  (1.549 m)   Wt 104 lb (47.2 kg)   LMP 07/11/2016   SpO2 98%   BMI 19.65 kg/m  General: Awake, appears stated age HEENT: MMM, EOMi Heart: RRR, no murmurs Lungs: CTAB, no rales, wheezes or rhonchi. No accessory muscle use Abd: BS+, soft, NT, ND, no masses or organomegaly Psych: Age appropriate judgment and insight, normal affect and mood  Assessment and  Plan: Migraine without aura and without status migrainosus, not intractable - Plan: rizatriptan (MAXALT) 5 MG tablet  Orders as above. Toradol today. Replace Relpax with Maxalt. Try taking magnesium daily. F/u in 4 weeks. If no improvement, will increase Mg and refer to Neurology. The patient voiced understanding and agreement to the plan.  Jilda Rocheicholas Paul BurtonWendling, DO 07/30/16  11:23 AM

## 2016-07-30 NOTE — Progress Notes (Signed)
Pre visit review using our clinic review tool, if applicable. No additional management support is needed unless otherwise documented below in the visit note. 

## 2016-08-02 ENCOUNTER — Encounter: Payer: BLUE CROSS/BLUE SHIELD | Admitting: Family Medicine

## 2016-08-02 MED ORDER — KETOROLAC TROMETHAMINE 60 MG/2ML IM SOLN
60.0000 mg | Freq: Once | INTRAMUSCULAR | Status: AC
  Administered 2016-08-02: 60 mg via INTRAMUSCULAR

## 2016-08-02 NOTE — Addendum Note (Signed)
Addended by: Neldon LabellaMABE, Jahnessa Vanduyn S on: 08/02/2016 08:01 AM   Modules accepted: Orders

## 2016-08-18 ENCOUNTER — Encounter: Payer: BLUE CROSS/BLUE SHIELD | Admitting: Obstetrics & Gynecology

## 2016-08-18 DIAGNOSIS — N946 Dysmenorrhea, unspecified: Secondary | ICD-10-CM

## 2016-09-23 ENCOUNTER — Ambulatory Visit (INDEPENDENT_AMBULATORY_CARE_PROVIDER_SITE_OTHER): Payer: BLUE CROSS/BLUE SHIELD | Admitting: Family Medicine

## 2016-09-23 ENCOUNTER — Ambulatory Visit (HOSPITAL_BASED_OUTPATIENT_CLINIC_OR_DEPARTMENT_OTHER)
Admission: RE | Admit: 2016-09-23 | Discharge: 2016-09-23 | Disposition: A | Discharge status: Home / Self Care | Payer: BLUE CROSS/BLUE SHIELD | Source: Ambulatory Visit | Attending: Family Medicine | Admitting: Family Medicine

## 2016-09-23 ENCOUNTER — Encounter: Payer: Self-pay | Admitting: Family Medicine

## 2016-09-23 VITALS — BP 100/70 | HR 83 | Temp 98.3°F | Ht 61.0 in | Wt 105.8 lb

## 2016-09-23 DIAGNOSIS — N926 Irregular menstruation, unspecified: Secondary | ICD-10-CM

## 2016-09-23 DIAGNOSIS — R51 Headache: Secondary | ICD-10-CM

## 2016-09-23 DIAGNOSIS — R519 Headache, unspecified: Secondary | ICD-10-CM

## 2016-09-23 LAB — POCT URINE PREGNANCY: PREG TEST UR: NEGATIVE

## 2016-09-23 MED ORDER — KETOROLAC TROMETHAMINE 60 MG/2ML IM SOLN
30.0000 mg | Freq: Once | INTRAMUSCULAR | Status: AC
  Administered 2016-09-23: 30 mg via INTRAMUSCULAR
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
## 2016-09-23 NOTE — Progress Notes (Signed)
Pre visit review using our clinic review tool, if applicable. No additional management support is needed unless otherwise documented below in the visit note. 

## 2016-09-23 NOTE — Patient Instructions (Signed)
You got a shot of toradol today for your headache.   Rest and take it easy for the rest of the day.   Remember no NSAIDs today like ibuprofen or aleve I will set you up to see a neurologist to discuss your headaches Please seek care if your toradol does not relieve your headache today

## 2016-10-12 ENCOUNTER — Encounter (HOSPITAL_BASED_OUTPATIENT_CLINIC_OR_DEPARTMENT_OTHER): Payer: Self-pay | Admitting: *Deleted

## 2016-10-12 ENCOUNTER — Emergency Department (HOSPITAL_BASED_OUTPATIENT_CLINIC_OR_DEPARTMENT_OTHER)
Admission: EM | Admit: 2016-10-12 | Discharge: 2016-10-12 | Disposition: A | Discharge status: Home / Self Care | Payer: Worker's Compensation | Attending: Emergency Medicine | Admitting: Emergency Medicine

## 2016-10-12 ENCOUNTER — Emergency Department (HOSPITAL_BASED_OUTPATIENT_CLINIC_OR_DEPARTMENT_OTHER): Payer: Worker's Compensation

## 2016-10-12 DIAGNOSIS — W19XXXA Unspecified fall, initial encounter: Secondary | ICD-10-CM | POA: Insufficient documentation

## 2016-10-12 DIAGNOSIS — S93401A Sprain of unspecified ligament of right ankle, initial encounter: Secondary | ICD-10-CM | POA: Insufficient documentation

## 2016-10-12 DIAGNOSIS — Y929 Unspecified place or not applicable: Secondary | ICD-10-CM | POA: Insufficient documentation

## 2016-10-12 DIAGNOSIS — S99911A Unspecified injury of right ankle, initial encounter: Secondary | ICD-10-CM | POA: Diagnosis present

## 2016-10-12 DIAGNOSIS — Y990 Civilian activity done for income or pay: Secondary | ICD-10-CM | POA: Diagnosis not present

## 2016-10-12 DIAGNOSIS — Y939 Activity, unspecified: Secondary | ICD-10-CM | POA: Diagnosis not present

## 2016-10-12 NOTE — ED Notes (Signed)
ED Provider at bedside. 

## 2016-10-12 NOTE — Discharge Instructions (Signed)
I recommended alternate between Tylenol 1000 mg every 6 hours as needed for pain and ibuprofen 800 mg every 8 hours as needed for pain.

## 2016-10-12 NOTE — ED Provider Notes (Signed)
By signing my name below, I, Vista Mink, attest that this documentation has been prepared under the direction and in the presence of Seabron Iannello N Jarmar Rousseau, DO. Electronically signed, Vista Mink, ED Scribe. 10/12/16. 3:11 AM.  TIME SEEN: 2:56 AM  CHIEF COMPLAINT: Right ankle injury  HPI:  HPI Comments: Tammy Mejia is a 26 y.o. female who presents to the Emergency Department complaining of right ankle pain s/p an injury that occurred yesterday while she was at work. Pt states that she rolled her ankle and had sudden onset pain immediately after. Pt has noted increased swelling to her right ankle since this occurred. She is able to ambulate but reports increased difficulty due to pain. No loss of sensation. No LOC. No head trauma.   ROS: See HPI Constitutional: no fever  Eyes: no drainage  ENT: no runny nose   Cardiovascular:  no chest pain  Resp: no SOB  GI: no vomiting GU: no dysuria Integumentary: no rash  Allergy: no hives  Musculoskeletal: no leg swelling  Neurological: no slurred speech ROS otherwise negative  PAST MEDICAL HISTORY/PAST SURGICAL HISTORY:  Past Medical History:  Diagnosis Date  . Back pain   . Deficiency of potassium   . Hypokalemia   . Migraine     MEDICATIONS:  Prior to Admission medications   Medication Sig Start Date End Date Taking? Authorizing Provider  escitalopram (LEXAPRO) 10 MG tablet Take 1 tablet (10 mg total) by mouth daily. 03/16/16   Sheliah Hatch, MD  Mefenamic Acid 250 MG CAPS Take 1 capsule (250 mg total) by mouth every 6 (six) hours as needed. 3 days max 07/12/16   Waldon Merl, PA-C  rizatriptan (MAXALT) 5 MG tablet Take 1 tablet (5 mg total) by mouth as needed for migraine. May repeat in 2 hours if needed 07/30/16   Sharlene Dory, DO  traZODone (DESYREL) 50 MG tablet Take 0.5-1 tablets (25-50 mg total) by mouth at bedtime as needed. for sleep 03/16/16   Sheliah Hatch, MD    ALLERGIES:  No Known  Allergies  SOCIAL HISTORY:  Social History  Substance Use Topics  . Smoking status: Never Smoker  . Smokeless tobacco: Never Used  . Alcohol use No    FAMILY HISTORY: Family History  Problem Relation Age of Onset  . COPD Mother     EXAM: BP 112/82   Pulse 97   Temp 98.3 F (36.8 C) (Oral)   Resp 16   Ht 5\' 1"  (1.549 m)   Wt 105 lb (47.6 kg)   LMP 09/15/2016   SpO2 97%   BMI 19.84 kg/m  CONSTITUTIONAL: Alert and oriented and responds appropriately to questions. Well-appearing; well-nourished; GCS 15 HEAD: Normocephalic; atraumatic EYES: Conjunctivae clear, PERRL, EOMI ENT: normal nose; no rhinorrhea; moist mucous membranes; pharynx without lesions noted; no dental injury; no septal hematoma NECK: Supple, no meningismus, no LAD; no midline spinal tenderness, step-off or deformity; trachea midline CARD: RRR; S1 and S2 appreciated; no murmurs, no clicks, no rubs, no gallops RESP: Normal chest excursion without splinting or tachypnea; breath sounds clear and equal bilaterally; no wheezes, no rhonchi, no rales; no hypoxia or respiratory distress CHEST:  chest wall stable, no crepitus or ecchymosis or deformity, nontender to palpation; no flail chest ABD/GI: Normal bowel sounds; non-distended; soft, non-tender, no rebound, no guarding; no ecchymosis or other lesions noted PELVIS:  stable, nontender to palpation BACK:  The back appears normal and is non-tender to palpation, there is no CVA tenderness;  no midline spinal tenderness, step-off or deformity EXT: TTP over the medial and lateral malleolus of the right ankle without deformity. No ligament Laxity, no tenderness of proximal fibular head. 2+ right DP pulse. Otherwise normal ROM in all joints; non-tender to palpation; no edema; normal capillary refill; no cyanosis, no bony tenderness or bony deformity of patient's extremities, no joint effusion, compartments are soft, extremities are warm and well-perfused, no ecchymosis or  lacerations    SKIN: Normal color for age and race; warm NEURO: Moves all extremities equally, sensation to light touch intact diffusely, cranial nerves II through XII intact PSYCH: The patient's mood and manner are appropriate. Grooming and personal hygiene are appropriate.  MEDICAL DECISION MAKING: Patient here with right ankle sprain. No significant ligament is laxity on exam. Neurovascularly intact distally. X-rays obtained show no fracture. No pain at the proximal fibular head. No other sign of trauma. We will place an Ace wrap for comfort and give crutches per patient's request. Have her committed alternate Tylenol and ibuprofen. Recommend rest, elevation and ice. Will provide with work note. Discussed return precautions. Given follow-up with sports medicine if symptoms not improving in the next 1-2 weeks with medical management. Patient is comfortable with this plan.   At this time, I do not feel there is any life-threatening condition present. I have reviewed and discussed all results (EKG, imaging, lab, urine as appropriate) and exam findings with patient/family. I have reviewed nursing notes and appropriate previous records.  I feel the patient is safe to be discharged home without further emergent workup and can continue workup as an outpatient as needed. Discussed usual and customary return precautions. Patient/family verbalize understanding and are comfortable with this plan.  Outpatient follow-up has been provided. All questions have been answered.   I personally performed the services described in this documentation, which was scribed in my presence. The recorded information has been reviewed and is accurate.     Layla MawKristen N Kenasia Scheller, DO 10/12/16 0425

## 2016-10-12 NOTE — ED Triage Notes (Signed)
Pt c/o right ankle injury  at work today.

## 2016-11-10 ENCOUNTER — Ambulatory Visit (INDEPENDENT_AMBULATORY_CARE_PROVIDER_SITE_OTHER): Payer: BLUE CROSS/BLUE SHIELD | Admitting: Family Medicine

## 2016-11-10 VITALS — BP 120/78 | HR 73 | Temp 98.0°F | Ht 61.0 in | Wt 103.2 lb

## 2016-11-10 DIAGNOSIS — N926 Irregular menstruation, unspecified: Secondary | ICD-10-CM | POA: Diagnosis not present

## 2016-11-10 DIAGNOSIS — G4489 Other headache syndrome: Secondary | ICD-10-CM | POA: Diagnosis not present

## 2016-11-10 DIAGNOSIS — N941 Unspecified dyspareunia: Secondary | ICD-10-CM | POA: Diagnosis not present

## 2016-11-10 LAB — POCT URINE PREGNANCY: Preg Test, Ur: NEGATIVE

## 2016-11-10 MED ORDER — KETOROLAC TROMETHAMINE 60 MG/2ML IM SOLN
30.0000 mg | Freq: Once | INTRAMUSCULAR | Status: AC
  Administered 2016-11-10: 30 mg via INTRAMUSCULAR

## 2016-11-10 NOTE — Patient Instructions (Signed)
We gave you a shot of toradol today for your headache- please let me know if this does not resolve your pain. I would like to have you consult with a neurologist- please let me know when you are able to do this

## 2016-11-10 NOTE — Progress Notes (Signed)
[facility] at [facility] [address], [address] [place], [place] [address] [phone]  Date:  11/10/2016   Name:  Tammy Mejia   DOB:  September 26, 1990   MRN:  [id number]  PCP:  Neena Rhymes, MD    Chief Complaint: Headache (c/o headache that started late this past Monday. Pt states that pain has been constant. Pt does have hx of heaches. Pain has been in the temporal area since Monday. Pt has tried to take Aleve which has not helped a lot. )   History of Present Illness:  Tammy Mejia is a 26 y.o. very pleasant female patient who presents with the following:  Patient knows that she is having a migraine headache.  She has had a headache since Monday (11-08-2016).  Off and on nausea,  But no vomiting.  Light sensitivity.  Slight ringing in both off and on since Monday.  Left side will sometimes have an earache feeling.  Denies blurry vision, double vision.  Headache description, 6 out of 10, throbbing in her temple area.  Denies any current pain in other locations.  Will occasionally get a "kick" in the back of her head.    Has had migraines since she was about 26 years old.  Has never seen neurology about her headaches. Would consider doing so but needs to get some vacation days from her job first.  She is currently working at night  Has taken Aleve once on Tuesday, with not improvement.  Tried heating pad on her head, without improvement.  No NSAIDs so far today  Has taken Maxalt in the past, with no relief.  Has tried other medications without any benefit.  Excederin Migraine doesn't sit well with her stomach, she vomits every times she takes it.  Drinking a soda has not helped.  She typically limits her caffeine intake.  Seen in this office on 09-23-2016, also for headache.  Feels like today's headache is a grade lower than in January. At that time we did a CT for worse HA of life that was normal   Taking Lexapro every day.  Works  night shift @ [employer], typically a loud environment.  Works 5 days a week, 8 shifts.    Still has anxiety.  She lays in bed to relax.  Occasional depression.  Has been moody latley.  Feels like the Lexapro is working most of the time however  She may get up to 3 bad headaches in a month- she has never seen neurology.  She is doing a bit better recently as her job is not as loud  She would like to have an HCG today- she is not on contraception and would be happy if she were pregnant. LMP was a little under a month ago  Also notes that she had some pelvic pain after intercourse about a week ago- this lasted for 1-2 days and then went away.  Right now she feels fine in this regard   Patient Active Problem List   Diagnosis Date Noted  . Anxiety and depression 03/16/2016  . Dental infection 06/05/2015  . Migraine 08/23/2014  . Insomnia 06/20/2014  . Dysmenorrhea 06/20/2014  . Palpitations 06/29/2013  . Hypokalemia 06/29/2013    Past Medical History:  Diagnosis Date  . Back pain   . Deficiency of potassium   . Hypokalemia   . Migraine     No past surgical history on file.  Social History  Substance Use Topics  . Smoking status: Never Smoker  . Smokeless tobacco: Never Used  . Alcohol use No    Family History  Problem Relation Age of Onset  . COPD Mother     No Known Allergies  Medication list has been reviewed and updated.  Current Outpatient Prescriptions on File Prior to Visit  Medication Sig Dispense Refill  . escitalopram (LEXAPRO) 10 MG tablet Take 1 tablet (10 mg total) by mouth daily. 30 tablet 3  . Mefenamic Acid 250 MG CAPS Take 1 capsule (250 mg total) by mouth every 6 (six) hours as needed. 3 days max 20 each 0  . rizatriptan (MAXALT) 5 MG tablet Take 1 tablet (5 mg total) by mouth as needed for migraine. May repeat in 2 hours if needed 10 tablet 0  . traZODone (DESYREL) 50 MG tablet Take 0.5-1 tablets (25-50 mg total) by mouth at bedtime as  needed. for sleep 30 tablet 3   No current facility-administered medications on file prior to visit.     Review of Systems:  As per HPI- otherwise negative No fever, chills, body aches, cough, ST, rash   Physical Examination: Vitals:   11/10/16 1147  BP: 120/78  Pulse: 73  Temp: 98 F (36.7 C)   Vitals:   11/10/16 1147  Weight: 103 lb 3.2 oz (46.8 kg)  Height: 5\' 1"  (1.549 m)   Body mass index is 19.5 kg/m. Ideal Body Weight: Weight in (lb) to have BMI = 25: 132  GEN: WDWN, NAD, Non-toxic, A & O x 3, slight build.  Multiple facial piercings.  Looks well  HEENT: Atraumatic, Normocephalic. Neck supple. No masses, No LAD.  Bilateral TM wnl, oropharynx normal.  PEERL,EOMI.   Ears and Nose: No external deformity. CV: RRR, No M/G/R. No JVD. No thrill. No extra heart sounds. PULM: CTA B, no wheezes, crackles, rhonchi. No retractions. No resp. distress. No accessory muscle use. ABD: S, NT, ND EXTR: No c/c/e NEURO Normal gait.   Normal strength, sensation and DTR of all extremities. No meningismus. Negative romberg PSYCH: Normally interactive. Conversant. Not depressed or anxious appearing.  Calm demeanor.   Results for orders placed or performed in visit on 11/10/16  POCT urine pregnancy  Result Value Ref Range   Preg Test, Ur Negative Negative    Assessment and Plan: Other headache syndrome - Plan: ketorolac (TORADOL) injection 30 mg  Menstrual irregularity - Plan: POCT urine pregnancy  Pain in female genitalia on intercourse  Here today with a recurrent migraine headache which follows her usual HA pattern Treated with toradol today- she will alert me if not helpful No further NSAIDs today She will let me know when she is able to take a neurology referral She will let us know if pelvic pain returns   Signed Abbe AmsterdamJessica Copland, MD

## 2016-11-10 NOTE — Progress Notes (Signed)
Pre visit review using our clinic review tool, if applicable. No additional management support is needed unless otherwise documented below in the visit note. 

## 2016-12-08 ENCOUNTER — Ambulatory Visit (INDEPENDENT_AMBULATORY_CARE_PROVIDER_SITE_OTHER): Payer: BLUE CROSS/BLUE SHIELD | Admitting: Medical

## 2016-12-08 ENCOUNTER — Encounter: Payer: Self-pay | Admitting: Medical

## 2016-12-08 VITALS — BP 112/73 | HR 99 | Temp 98.3°F | Resp 16 | Ht 61.0 in | Wt 103.6 lb

## 2016-12-08 DIAGNOSIS — R195 Other fecal abnormalities: Secondary | ICD-10-CM

## 2016-12-08 DIAGNOSIS — R110 Nausea: Secondary | ICD-10-CM

## 2016-12-08 DIAGNOSIS — R6883 Chills (without fever): Secondary | ICD-10-CM | POA: Diagnosis not present

## 2016-12-08 DIAGNOSIS — R52 Pain, unspecified: Secondary | ICD-10-CM | POA: Diagnosis not present

## 2016-12-08 LAB — POCT URINE PREGNANCY: PREG TEST UR: NEGATIVE

## 2016-12-08 LAB — POC URINALSYSI DIPSTICK (AUTOMATED)
BILIRUBIN UA: NEGATIVE
Blood, UA: NEGATIVE
Glucose, UA: NEGATIVE
Ketones, UA: NEGATIVE
Leukocytes, UA: NEGATIVE
NITRITE UA: NEGATIVE
PH UA: 6 (ref 5.0–8.0)
Protein, UA: NEGATIVE
Spec Grav, UA: 1.03 (ref 1.030–1.035)
UROBILINOGEN UA: NEGATIVE (ref ?–2.0)

## 2016-12-08 LAB — POC INFLUENZA A&B (BINAX/QUICKVUE)
INFLUENZA B, POC: NEGATIVE
Influenza A, POC: NEGATIVE

## 2016-12-08 MED ORDER — ONDANSETRON 8 MG PO TBDP: 8.0000 mg | ORAL_TABLET | Freq: Three times a day (TID) | ORAL | 0 refills | Status: DC | PRN

## 2016-12-08 MED ORDER — RANITIDINE HCL 150 MG PO CAPS: 150.0000 mg | ORAL_CAPSULE | Freq: Two times a day (BID) | ORAL | 0 refills | Status: DC

## 2016-12-08 NOTE — Patient Instructions (Signed)
For your nausea will rx zofran and will rx ranitidine. You may have some gerd symptoms so I want you to eat bland and healthy diet.   Your nausea does require work up today. Please get labs. Urine preg test done for caution sake and was negative. If your are late for your cycle later this month ask you repeat test again. If by chance + then stop all meds and notify us.  If your loose stools persist by Friday then turn in stool panel kit.  You mentioned chills and body ache on Monday. We will do flu test today. We will update you on that result  Follow up in 10-14 days or as needed

## 2016-12-08 NOTE — Progress Notes (Signed)
Subjective:    Patient ID: Tammy Mejia, female    DOB: 04/22/1991, 26 y.o.   MRN: [id number]  HPI  Pt in states recently feeling nausea for about 10 days. At beginning was just after eating pasta and tomato type sauces. Now anything she eats makes her feels nausea. No burning to stomach. No sour taste in mouth. No belching. Last 4-5 days anything she eats makes her feel nausea. No vomiting.  Pt never had heart burn in the past.  Pt LMP- November 16, 2016. She states was normal cycle and came when she expected.  Pt also states since past Friday has had 2 loose stools. Pt had no recent antibiotic. No hx of chronic diarrhea. No hx of inflammatory bowel disease or any known ibs.  Pt states this past weekend and Monday felt some mild chills and some mild body aches.  Review of Systems  Constitutional: Positive for chills.       Chills on Monday none since.  Gastrointestinal: Positive for nausea. Negative for abdominal distention, abdominal pain, constipation, diarrhea and vomiting.  Genitourinary: Positive for frequency. Negative for difficulty urinating, dyspareunia, enuresis, menstrual problem, pelvic pain and urgency.       Always the case for her.  Musculoskeletal: Positive for myalgias. Negative for back pain.       On Monday.  Neurological: Negative for dizziness, seizures, speech difficulty, weakness, light-headedness and headaches.  Hematological: Negative for adenopathy. Does not bruise/bleed easily.  Psychiatric/Behavioral: Negative for agitation, behavioral problems, confusion, self-injury and sleep disturbance.   Past Medical History:  Diagnosis Date  . Back pain   . Deficiency of potassium   . Hypokalemia   . Migraine      Social History   Social History  . Marital status: Single    Spouse name: N/A  . Number of children: N/A  . Years of education: N/A   Occupational History  . Not on file.   Social History Main Topics  . Smoking status: Never Smoker    . Smokeless tobacco: Never Used  . Alcohol use No  . Drug use: No  . Sexual activity: Yes    Birth control/ protection: None   Other Topics Concern  . Not on file   Social History Narrative  . No narrative on file    No past surgical history on file.  Family History  Problem Relation Age of Onset  . COPD Mother     No Known Allergies  Current Outpatient Prescriptions on File Prior to Visit  Medication Sig Dispense Refill  . escitalopram (LEXAPRO) 10 MG tablet Take 1 tablet (10 mg total) by mouth daily. 30 tablet 3  . Mefenamic Acid 250 MG CAPS Take 1 capsule (250 mg total) by mouth every 6 (six) hours as needed. 3 days max 20 each 0  . rizatriptan (MAXALT) 5 MG tablet Take 1 tablet (5 mg total) by mouth as needed for migraine. May repeat in 2 hours if needed 10 tablet 0  . traZODone (DESYREL) 50 MG tablet Take 0.5-1 tablets (25-50 mg total) by mouth at bedtime as needed. for sleep 30 tablet 3   No current facility-administered medications on file prior to visit.     BP 112/73 (BP Location: Left Arm, Cuff Size: Normal)   Pulse 99   Temp 98.3 F (36.8 C) (Oral)   Resp 16   Ht _0  (1.549 m)   Wt 103 lb 9.6 oz (47 kg)   LMP 11/16/2016  SpO2 98%   BMI 19.58 kg/m       Objective:   Physical Exam  General Appearance- Not in acute distress.  HEENT Eyes- Scleraeral/Conjuntiva-bilat- Not Yellow. Mouth & Throat- Normal.  Chest and Lung Exam Auscultation: Breath sounds:-Normal. Adventitious sounds:- No Adventitious sounds.  Cardiovascular Auscultation:Rythm - Regular. Heart Sounds -Normal heart sounds.  Abdomen Inspection:-Inspection Normal.  Palpation/Perucssion: Palpation and Percussion of the abdomen reveal- Non Tender, No Rebound tenderness, No rigidity(Guarding) and No Palpable abdominal masses.  Liver:-Normal.  Spleen:- Normal.   Back- no cva tenderness.      Assessment & Plan:  For your nausea will rx zofran and will rx ranitidine. You may  have some gerd symptoms so I want you to eat bland and healthy diet.   Your nausea does require work up today. Please get labs. Urine preg test done for caution sake and was negative. If your are late for your cycle later this month ask you repeat test again. If by chance + then stop all meds and notify us.  If your loose stools persist by Friday then turn in stool panel kit.  You mentioned chills and body ache on Monday. We will do flu test today. We will update you on that result  Follow up in 10-14 days or as needed

## 2016-12-08 NOTE — Progress Notes (Signed)
Pre visit review using our clinic review tool, if applicable. No additional management support is needed unless otherwise documented below in the visit note. 

## 2016-12-09 LAB — COMPREHENSIVE METABOLIC PANEL
ALBUMIN: 3.9 g/dL (ref 3.5–5.2)
ALK PHOS: 69 U/L (ref 39–117)
ALT: 8 U/L (ref 0–35)
AST: 14 U/L (ref 0–37)
BILIRUBIN TOTAL: 0.3 mg/dL (ref 0.2–1.2)
BUN: 7 mg/dL (ref 6–23)
CHLORIDE: 106 meq/L (ref 96–112)
CO2: 26 mEq/L (ref 19–32)
Calcium: 9.1 mg/dL (ref 8.4–10.5)
Creatinine, Ser: 0.65 mg/dL (ref 0.40–1.20)
GFR: 117.53 mL/min (ref >60.00)
Glucose, Bld: 71 mg/dL (ref 70–99)
POTASSIUM: 3.5 meq/L (ref 3.5–5.1)
SODIUM: 138 meq/L (ref 135–145)
Total Protein: 6.9 g/dL (ref 6.0–8.3)

## 2016-12-09 LAB — CBC WITH DIFFERENTIAL/PLATELET
BASOS PCT: 0.8 % (ref 0.0–3.0)
Basophils Absolute: 0.1 10*3/uL (ref 0.0–0.1)
EOS PCT: 1.7 % (ref 0.0–5.0)
Eosinophils Absolute: 0.2 10*3/uL (ref 0.0–0.7)
HCT: 40.6 % (ref 36.0–46.0)
Hemoglobin: 13.6 g/dL (ref 12.0–15.0)
LYMPHS ABS: 1.7 10*3/uL (ref 0.7–4.0)
Lymphocytes Relative: 16.9 % (ref 12.0–46.0)
MCHC: 33.4 g/dL (ref 30.0–36.0)
MCV: 90.4 fl (ref 78.0–100.0)
MONO ABS: 1 10*3/uL (ref 0.1–1.0)
Monocytes Relative: 10 % (ref 3.0–12.0)
NEUTROS PCT: 70.6 % (ref 43.0–77.0)
Neutro Abs: 7.1 10*3/uL (ref 1.4–7.7)
PLATELETS: 250 10*3/uL (ref 150.0–400.0)
RBC: 4.49 Mil/uL (ref 3.87–5.11)
RDW: 14 % (ref 11.5–15.5)
WBC: 10.1 10*3/uL (ref 4.0–10.5)

## 2016-12-09 LAB — LIPASE: Lipase: 11 U/L (ref 11.0–59.0)

## 2016-12-09 LAB — AMYLASE: Amylase: 54 U/L (ref 27–131)

## 2016-12-09 LAB — H. PYLORI BREATH TEST: H. pylori Breath Test: NOT DETECTED

## 2017-01-10 ENCOUNTER — Encounter: Payer: Self-pay | Admitting: Family Medicine

## 2017-01-10 ENCOUNTER — Ambulatory Visit (INDEPENDENT_AMBULATORY_CARE_PROVIDER_SITE_OTHER): Payer: BLUE CROSS/BLUE SHIELD | Admitting: Family Medicine

## 2017-01-10 VITALS — BP 102/60 | HR 73 | Temp 98.4°F | Ht 61.0 in | Wt 104.2 lb

## 2017-01-10 DIAGNOSIS — R110 Nausea: Secondary | ICD-10-CM

## 2017-01-10 DIAGNOSIS — Z8639 Personal history of other endocrine, nutritional and metabolic disease: Secondary | ICD-10-CM

## 2017-01-10 LAB — BASIC METABOLIC PANEL
BUN: 6 mg/dL (ref 6–23)
CHLORIDE: 108 meq/L (ref 96–112)
CO2: 26 meq/L (ref 19–32)
CREATININE: 0.68 mg/dL (ref 0.40–1.20)
Calcium: 9.1 mg/dL (ref 8.4–10.5)
GFR: 111.49 mL/min (ref >60.00)
Glucose, Bld: 81 mg/dL (ref 70–99)
POTASSIUM: 3.7 meq/L (ref 3.5–5.1)
Sodium: 139 mEq/L (ref 135–145)

## 2017-01-10 LAB — POCT URINE PREGNANCY: Preg Test, Ur: NEGATIVE

## 2017-01-10 MED ORDER — POTASSIUM CHLORIDE ER 10 MEQ PO TBCR: 10.0000 meq | EXTENDED_RELEASE_TABLET | Freq: Every day | ORAL | 2 refills | Status: DC

## 2017-01-10 NOTE — Patient Instructions (Signed)
Stay well hydrated.  If you start having fevers, bleeding or worsening symptoms, seek care.

## 2017-01-10 NOTE — Progress Notes (Signed)
Pre visit review using our clinic review tool, if applicable. No additional management support is needed unless otherwise documented below in the visit note. 

## 2017-01-10 NOTE — Progress Notes (Signed)
Chief Complaint  Patient presents with  . Nausea    low potassium    Subjective: Patient is a 26 y.o. female here for nausea.  Pt has a hx of hypokalemia and has not been taking her K. She feels nauseated whenever this happens. Associated light headedness, some cramping, and muscle stiffness. Denies any bleeding, diarrhea, vomiting, recent travel, or change in medications. No sick contacts. Her last period was 3/26.  ROS: Const: no fevers GI: As noted in HPI  Family History  Problem Relation Age of Onset  . COPD Mother    Past Medical History:  Diagnosis Date  . Back pain   . Deficiency of potassium   . Hypokalemia   . Migraine    No Known Allergies  Current Outpatient Prescriptions:  .  escitalopram (LEXAPRO) 10 MG tablet, Take 1 tablet (10 mg total) by mouth daily., Disp: 30 tablet, Rfl: 3 .  Mefenamic Acid 250 MG CAPS, Take 1 capsule (250 mg total) by mouth every 6 (six) hours as needed. 3 days max, Disp: 20 each, Rfl: 0 .  ondansetron (ZOFRAN ODT) 8 MG disintegrating tablet, Take 1 tablet (8 mg total) by mouth every 8 (eight) hours as needed for nausea or vomiting., Disp: 20 tablet, Rfl: 0 .  ranitidine (ZANTAC) 150 MG capsule, Take 1 capsule (150 mg total) by mouth 2 (two) times daily., Disp: 60 capsule, Rfl: 0 .  rizatriptan (MAXALT) 5 MG tablet, Take 1 tablet (5 mg total) by mouth as needed for migraine. May repeat in 2 hours if needed, Disp: 10 tablet, Rfl: 0 .  traZODone (DESYREL) 50 MG tablet, Take 0.5-1 tablets (25-50 mg total) by mouth at bedtime as needed. for sleep, Disp: 30 tablet, Rfl: 3 .  potassium chloride (KLOR-CON 10) 10 MEQ tablet, Take 1 tablet (10 mEq total) by mouth daily., Disp: 90 tablet, Rfl: 2  Objective: BP 102/60 (BP Location: Left Arm, Patient Position: Sitting, Cuff Size: Normal)   Pulse 73   Temp 98.4 F (36.9 C) (Oral)   Ht  (1.549 m)   Wt 104 lb 4 oz (47.3 kg)   SpO2 98%   BMI 19.70 kg/m  General: Awake, appears stated  age Heart: Reg rate, +sinus arrythmia, no murmurs Lungs: CTAB, no rales, wheezes or rhonchi. No accessory muscle use Abd: BS+, soft, NT, ND, no masses or organomegaly Psych: Age appropriate judgment and insight, normal affect and mood  Assessment and Plan: Nausea  History of hypokalemia - Plan: Basic metabolic panel, potassium chloride (KLOR-CON 10) 10 MEQ tablet  Orders as above. Urine preg neg. Will call in supplement, OK to compare to OTC pricing.  F/u prn. The patient voiced understanding and agreement to the plan.  Jilda Roche [place], DO 01/10/17  2:11 PM

## 2017-01-10 NOTE — Addendum Note (Signed)
Addended by: Scharlene Gloss B on: 01/10/2017 02:16 PM   Modules accepted: Orders

## 2017-01-10 NOTE — Addendum Note (Signed)
Addended by: Verdie Shire on: 01/10/2017 02:22 PM   Modules accepted: Orders

## 2017-02-04 ENCOUNTER — Telehealth: Payer: Self-pay | Admitting: Family Medicine

## 2017-02-04 NOTE — Telephone Encounter (Signed)
Ok to transfer. 

## 2017-02-04 NOTE — Telephone Encounter (Signed)
Patent request to transfer care from Dr. Beverely Lowabori to Dr. Carmelia RollerWendling

## 2017-02-09 ENCOUNTER — Encounter (HOSPITAL_BASED_OUTPATIENT_CLINIC_OR_DEPARTMENT_OTHER): Payer: Self-pay

## 2017-02-09 ENCOUNTER — Emergency Department (HOSPITAL_BASED_OUTPATIENT_CLINIC_OR_DEPARTMENT_OTHER)
Admission: EM | Admit: 2017-02-09 | Discharge: 2017-02-10 | Disposition: A | Discharge status: Home / Self Care | Payer: BLUE CROSS/BLUE SHIELD | Attending: Emergency Medicine | Admitting: Emergency Medicine

## 2017-02-09 DIAGNOSIS — G43419 Hemiplegic migraine, intractable, without status migrainosus: Secondary | ICD-10-CM | POA: Insufficient documentation

## 2017-02-09 DIAGNOSIS — Z79899 Other long term (current) drug therapy: Secondary | ICD-10-CM | POA: Diagnosis not present

## 2017-02-09 DIAGNOSIS — G43409 Hemiplegic migraine, not intractable, without status migrainosus: Secondary | ICD-10-CM

## 2017-02-09 DIAGNOSIS — R51 Headache: Secondary | ICD-10-CM | POA: Diagnosis present

## 2017-02-09 MED ORDER — METOCLOPRAMIDE HCL 5 MG/ML IJ SOLN
10.0000 mg | Freq: Once | INTRAMUSCULAR | Status: AC
  Administered 2017-02-10: 10 mg via INTRAVENOUS
  Filled 2017-02-09: qty 2

## 2017-02-09 MED ORDER — DIPHENHYDRAMINE HCL 50 MG/ML IJ SOLN
25.0000 mg | Freq: Once | INTRAMUSCULAR | Status: AC
  Administered 2017-02-10: 25 mg via INTRAVENOUS
  Filled 2017-02-09: qty 1

## 2017-02-09 MED ORDER — SODIUM CHLORIDE 0.9 % IV BOLUS (SEPSIS)
1000.0000 mL | Freq: Once | INTRAVENOUS | Status: AC
Start: 2017-02-10 — End: 2017-02-10
  Administered 2017-02-10: 1000 mL via INTRAVENOUS

## 2017-02-09 MED ORDER — KETOROLAC TROMETHAMINE 15 MG/ML IJ SOLN
15.0000 mg | Freq: Once | INTRAMUSCULAR | Status: AC
  Administered 2017-02-10: 15 mg via INTRAVENOUS
  Filled 2017-02-09: qty 1

## 2017-02-09 NOTE — ED Notes (Signed)
Pt reports having a migraine x2 days. Pt states she feels like "she might pass out" when ambulating.

## 2017-02-09 NOTE — ED Provider Notes (Signed)
[facility] Provider Note: Tammy Dell, MD, FACEP  CSN: [id number] MRN: [id number] ARRIVAL: 02/09/17 at 2321 ROOM: [facility]02/[facility]02   CHIEF COMPLAINT  Migraine   HISTORY OF PRESENT ILLNESS  Tammy Mejia is a 26 y.o. female with a history of migraines. She is here with a headache that began yesterday afternoon. The headache began bifrontally but is now felt in the sinus region. This is a typical pattern for her migraines. She rates her pain as a 7 out of 10. There has been associated nausea without vomiting. There has been associated photophobia. She has taken Aleve without relief.   Past Medical History:  Diagnosis Date  . Back pain   . Deficiency of potassium   . Hypokalemia   . Migraine     History reviewed. No pertinent surgical history.  Family History  Problem Relation Age of Onset  . COPD Mother     Social History  Substance Use Topics  . Smoking status: Never Smoker  . Smokeless tobacco: Never Used  . Alcohol use No    Prior to Admission medications   Medication Sig Start Date End Date Taking? Authorizing Provider  escitalopram (LEXAPRO) 10 MG tablet Take 1 tablet (10 mg total) by mouth daily. 03/16/16   Sheliah Hatch, MD  Mefenamic Acid 250 MG CAPS Take 1 capsule (250 mg total) by mouth every 6 (six) hours as needed. 3 days max 07/12/16   Waldon Merl, PA-C  ondansetron (ZOFRAN ODT) 8 MG disintegrating tablet Take 1 tablet (8 mg total) by mouth every 8 (eight) hours as needed for nausea or vomiting. 12/08/16   Saguier, Ramon Dredge, PA-C  potassium chloride (KLOR-CON 10) 10 MEQ tablet Take 1 tablet (10 mEq total) by mouth daily. 01/10/17   Sharlene Dory, DO  ranitidine (ZANTAC) 150 MG capsule Take 1 capsule (150 mg total) by mouth 2 (two) times daily. 12/08/16   Saguier, Ramon Dredge, PA-C  rizatriptan (MAXALT) 5 MG tablet Take 1 tablet (5 mg total) by mouth as needed for migraine. May repeat in 2 hours if needed 07/30/16   Sharlene Dory, DO  traZODone (DESYREL) 50 MG tablet Take 0.5-1 tablets (25-50 mg total) by mouth at bedtime as needed. for sleep 03/16/16   Sheliah Hatch, MD    Allergies Patient has no known allergies.   REVIEW OF SYSTEMS  Negative except as noted here or in the History of Present Illness.   PHYSICAL EXAMINATION  Initial Vital Signs Blood pressure 110/68, pulse 77, temperature 98.5 F (36.9 C), temperature source Oral, resp. rate 16, height 5\' 4"  (1.626 m), weight 47.6 kg (105 lb), last menstrual period 01/14/2017, SpO2 100 %.  Examination General: Well-developed, well-nourished female in no acute distress; appearance consistent with age of record HENT: normocephalic; atraumatic Eyes: pupils equal, round and reactive to light; extraocular muscles intact; photophobia Neck: supple Heart: regular rate and rhythm Lungs: clear to auscultation bilaterally Abdomen: soft; nondistended; nontender; bowel sounds present Extremities: No deformity; full range of motion; pulses normal Neurologic: Awake, alert and oriented; motor function intact in all extremities and symmetric; no facial droop Skin: Warm and dry Psychiatric: Normal mood and affect   RESULTS  Summary of this visit's results, reviewed by myself:   EKG Interpretation  Date/Time:    Ventricular Rate:    PR Interval:    QRS Duration:   QT Interval:    QTC Calculation:   R Axis:     Text Interpretation:  Laboratory Studies: No results found for this or any previous visit (from the past 24 hour(s)). Imaging Studies: No results found.  ED COURSE  Nursing notes and initial vitals signs, including pulse oximetry, reviewed.  Vitals:   02/09/17 2326 02/09/17 2328  BP: 110/68   Pulse: 77   Resp: 16   Temp: 98.5 F (36.9 C)   TempSrc: Oral   SpO2: 100% 100%  Weight: 47.6 kg (105 lb)   Height: 5\' 4"  (1.626 m)    12:45 AM Symptoms significant relieved after IV fluids and medications.  PROCEDURES    ED  DIAGNOSES     ICD-9-CM ICD-10-CM   1. 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

## 2017-02-09 NOTE — ED Triage Notes (Signed)
Pt reports migraine x 2 days. 

## 2017-02-14 NOTE — Telephone Encounter (Signed)
OK w me.  

## 2017-03-18 ENCOUNTER — Encounter: Payer: Self-pay | Admitting: Family Medicine

## 2017-03-18 ENCOUNTER — Ambulatory Visit (INDEPENDENT_AMBULATORY_CARE_PROVIDER_SITE_OTHER): Payer: Self-pay | Admitting: Family Medicine

## 2017-03-18 VITALS — BP 90/70 | HR 78 | Temp 98.3°F | Ht 61.0 in | Wt 103.2 lb

## 2017-03-18 DIAGNOSIS — Z3A01 Less than 8 weeks gestation of pregnancy: Secondary | ICD-10-CM

## 2017-03-18 LAB — POCT URINE PREGNANCY: PREG TEST UR: POSITIVE — AB

## 2017-03-18 NOTE — Patient Instructions (Addendum)
Avoid NSAIDs.  Take a prenatal vitamin daily.   Use Melatonin for sleep, do not go more than 5 mg.   OK to take Tylenol 1000 mg (2 extra strength tabs) or 975 mg (3 regular strength tabs) every 6 hours as needed.  Eating Plan for Pregnant Women While you are pregnant, your body will require additional nutrition to help support your growing baby. It is recommended that you consume:  150 additional calories each day during your first trimester.  300 additional calories each day during your second trimester.  300 additional calories each day during your third trimester.  Eating a healthy, well-balanced diet is very important for your health and for your baby's health. You also have a higher need for some vitamins and minerals, such as folic acid, calcium, iron, and vitamin D. What do I need to know about eating during pregnancy?  Do not try to lose weight or go on a diet during pregnancy.  Choose healthy, nutritious foods. Choose  of a sandwich with a glass of milk instead of a candy bar or a high-calorie sugar-sweetened beverage.  Limit your overall intake of foods that have "empty calories." These are foods that have little nutritional value, such as sweets, desserts, candies, sugar-sweetened beverages, and fried foods.  Eat a variety of foods, especially fruits and vegetables.  Take a prenatal vitamin to help meet the additional needs during pregnancy, specifically for folic acid, iron, calcium, and vitamin D.  Remember to stay active. Ask your health care provider for exercise recommendations that are specific to you.  Practice good food safety and cleanliness, such as washing your hands before you eat and after you prepare raw meat. This helps to prevent foodborne illnesses, such as listeriosis, that can be very dangerous for your baby. Ask your health care provider for more information about listeriosis. What does 150 extra calories look like? Healthy options for an additional  150 calories each day could be any of the following:  Plain low-fat yogurt (6-8 oz) with  cup of berries.  1 apple with 2 teaspoons of peanut butter.  Cut-up vegetables with  cup of hummus.  Low-fat chocolate milk (8 oz or 1 cup).  1 string cheese with 1 medium orange.   of a peanut butter and jelly sandwich on whole-wheat bread (1 tsp of peanut butter).  For 300 calories, you could eat two of those healthy options each day. What is a healthy amount of weight to gain? The recommended amount of weight for you to gain is based on your pre-pregnancy BMI. If your pre-pregnancy BMI was:  Less than 18 (underweight), you should gain 28-40 lb.  18-24.9 (normal), you should gain 25-35 lb.  25-29.9 (overweight), you should gain 15-25 lb.  Greater than 30 (obese), you should gain 11-20 lb.  What if I am having twins or multiples? Generally, pregnant women who will be having twins or multiples may need to increase their daily calories by 300-600 calories each day. The recommended range for total weight gain is 25-54 lb, depending on your pre-pregnancy BMI. Talk with your health care provider for specific guidance about additional nutritional needs, weight gain, and exercise during your pregnancy. What foods can I eat? Grains Any grains. Try to choose whole grains, such as whole-wheat bread, oatmeal, or brown rice. Vegetables Any vegetables. Try to eat a variety of colors and types of vegetables to get a full range of vitamins and minerals. Remember to wash your vegetables well before eating. Fruits Any  fruits. Try to eat a variety of colors and types of fruit to get a full range of vitamins and minerals. Remember to wash your fruits well before eating. Meats and Other Protein Sources Lean meats, including chicken, turkey, fish,[place] and lean cuts of beef, veal, or pork. Make sure that all meats are cooked to "well done." Tofu. Tempeh. Beans. Eggs. Peanut butter and other nut butters. Seafood,  such as shrimp, crab, and lobster. If you choose fish, select types that are higher in omega-3 fatty acids, including salmon, herring, mussels, trout, sardines, and pollock. Make sure that all meats are cooked to food-safe temperatures. Dairy Pasteurized milk and milk alternatives. Pasteurized yogurt and pasteurized cheese. Cottage cheese. Sour cream. Beverages Water. Juices that contain 100% fruit juice or vegetable juice. Caffeine-free teas and decaffeinated coffee. Drinks that contain caffeine are okay to drink, but it is better to avoid caffeine. Keep your total caffeine intake to less than 200 mg each day (12 oz of coffee, tea, or soda) or as directed by your health care provider. Condiments Any pasteurized condiments. Sweets and Desserts Any sweets and desserts. Fats and Oils Any fats and oils. The items listed above may not be a complete list of recommended foods or beverages. Contact your dietitian for more options. What foods are not recommended? Vegetables Unpasteurized (raw) vegetable juices. Fruits Unpasteurized (raw) fruit juices. Meats and Other Protein Sources Cured meats that have nitrates, such as bacon, salami, and hotdogs. Luncheon meats, bologna, or other deli meats (unless they are reheated until they are steaming hot). Refrigerated pate, meat spreads from a meat counter, smoked seafood that is found in the refrigerated section of a store. Raw fish, such as sushi or sashimi. High mercury content fish, such as tilefish, shark, swordfish, and king mackerel. Raw meats, such as tuna or beef tartare. Undercooked meats and poultry. Make sure that all meats are cooked to food-safe temperatures. Dairy Unpasteurized (raw) milk and any foods that have raw milk in them. Soft cheeses, such as feta, queso blanco, queso fresco, Brie, Camembert cheeses, blue-veined cheeses, and Panela cheese (unless it is made with pasteurized milk, which must be stated on the label). Beverages Alcohol.  Sugar-sweetened beverages, such as sodas, teas, or energy drinks. Condiments Homemade fermented foods and drinks, such as pickles, sauerkraut, or kombucha drinks. (Store-bought pasteurized versions of these are okay.) Other Salads that are made in the store, such as ham salad, chicken salad, egg salad, tuna salad, and seafood salad. The items listed above may not be a complete list of foods and beverages to avoid. Contact your dietitian for more information. This information is not intended to replace advice given to you by your health care provider. Make sure you discuss any questions you have with your health care provider. Document Released: 06/21/2014 Document Revised: 02/12/2016 Document Reviewed: 02/19/2014 Elsevier Interactive Patient Education  [employer]2018 Elsevier Inc.

## 2017-03-18 NOTE — Progress Notes (Signed)
Chief Complaint  Patient presents with  . Confirm pregnant    Subjective: Patient is a 26 y.o. female here for possible pregnancy.  02/11/17, very regular, took 3 home pregnancy tests that were all positive; expecting 6/24, usually only 1-2 days late. She is not having any spotting or bleeding, she is having some cramping. This is her first pregnancy and the potential FOB is with her. This is a planned pregnancy. She has not been taking her medicines routinely with the exception of prn trazodone.  ROS: GU: Denies vag bleeding  Family History  Problem Relation Age of Onset  . COPD Mother    Past Medical History:  Diagnosis Date  . Back pain   . Deficiency of potassium   . Hypokalemia   . Migraine    No Known Allergies  Current Outpatient Prescriptions:  .  potassium chloride (KLOR-CON 10) 10 MEQ tablet, Take 1 tablet (10 mEq total) by mouth daily., Disp: 90 tablet, Rfl: 2  Objective: BP 90/70 (BP Location: Left Arm, Patient Position: Sitting, Cuff Size: Normal)   Pulse 78   Temp 98.3 F (36.8 C) (Oral)   Ht 5\' 1"  (1.549 m)   Wt 103 lb 3.2 oz (46.8 kg)   LMP 02/11/2017 (Approximate)   SpO2 97%   BMI 19.50 kg/m  General: Awake, appears stated age Lungs: No accessory muscle use Psych: Age appropriate judgment and insight, normal affect and mood  Assessment and Plan: Less than [redacted] weeks gestation of pregnancy - Plan: Ambulatory referral to Obstetrics / Gynecology, POCT urine pregnancy  Orders as above. Congratulations to her and FOB.  Urine preg positive today. Start taking prenatal vitamin. Stop trazodone, Lexapro, Maxalt, Zofran, okay to continue potassium. Tylenol for pain only, avoid NSAIDs. List for foods to migrate to and a list of foods to avoid while pregnant given. Fu with us prn.  The patient voiced understanding and agreement to the plan.  Jilda Rocheicholas Paul East SideWendling, DO 03/18/17  9:33 AM

## 2017-04-20 ENCOUNTER — Telehealth: Payer: Self-pay | Admitting: Family Medicine

## 2017-04-20 ENCOUNTER — Other Ambulatory Visit: Payer: Self-pay | Admitting: Obstetrics & Gynecology

## 2017-04-20 ENCOUNTER — Ambulatory Visit (INDEPENDENT_AMBULATORY_CARE_PROVIDER_SITE_OTHER): Payer: Medicaid Other | Admitting: Obstetrics & Gynecology

## 2017-04-20 ENCOUNTER — Ambulatory Visit (HOSPITAL_BASED_OUTPATIENT_CLINIC_OR_DEPARTMENT_OTHER)
Admission: RE | Admit: 2017-04-20 | Discharge: 2017-04-20 | Disposition: A | Discharge status: Home / Self Care | Payer: Medicaid Other | Source: Ambulatory Visit | Attending: Obstetrics & Gynecology | Admitting: Obstetrics & Gynecology

## 2017-04-20 ENCOUNTER — Encounter: Payer: Self-pay | Admitting: Obstetrics & Gynecology

## 2017-04-20 VITALS — BP 100/62 | HR 54 | Wt 105.0 lb

## 2017-04-20 DIAGNOSIS — Z3A09 9 weeks gestation of pregnancy: Secondary | ICD-10-CM | POA: Diagnosis not present

## 2017-04-20 DIAGNOSIS — O3680X0 Pregnancy with inconclusive fetal viability, not applicable or unspecified: Secondary | ICD-10-CM

## 2017-04-20 DIAGNOSIS — Z3490 Encounter for supervision of normal pregnancy, unspecified, unspecified trimester: Secondary | ICD-10-CM

## 2017-04-20 NOTE — Addendum Note (Signed)
Addended by: Anell BarrHOWARD, Jsaon Yoo L on: 04/20/2017 03:14 PM   Modules accepted: Orders

## 2017-04-20 NOTE — Progress Notes (Signed)
   Subjective:    Patient ID: Tammy Mejia, female    DOB: 10/08/1990, 26 y.o.   MRN: [id number]  HPI 26 yo SW G1 here for a NOB visit. She denies any problems.   Review of Systems     Objective:   Physical Exam  Well nourished, well hydrated white female, no apparent distress Breathing, conversing, and ambulating normally Bedside u/s shows 10 week fetus with no FHR, radiology confirmed this.       Assessment & Plan:  Missed ab- offered watchful waiting versus cytotec At this time she prefers watchful waiting. She will call if she changes her mind. If no bleeding by 4 weeks, then rec cytotec

## 2017-04-20 NOTE — Telephone Encounter (Signed)
Caller name: Relation to ON:GEXBpt:self Call back number: [phone] Pharmacy: [facility] Reason for call: pt had a appointment today and states they informed her that they could not hear a heartbeat with her pregnancy, pt is having anxiety issues really bad and is wanted to know if she can get back on her lexapro, states dr. Beverely Lowtabori originally written the rx, pt not sure how long she has to wait before getting back on it . Please advise

## 2017-04-20 NOTE — Progress Notes (Signed)
Bedside ultrasound performed for viability. CRL 2.12 which measure at 8 weeks and 5 days a difference of 1 week and 3 days from her LMP. Patient reports no bleeding. Unable to see Fetal heart rate. Patient sent down to radiology for imaging to confirm viability. Armandina StammerJennifer Andera Cranmer RNBSN

## 2017-04-22 ENCOUNTER — Encounter: Payer: Self-pay | Admitting: Obstetrics & Gynecology

## 2017-04-22 MED ORDER — ESCITALOPRAM OXALATE 10 MG PO TABS: 10.0000 mg | ORAL_TABLET | Freq: Every day | ORAL | 0 refills | Status: DC

## 2017-04-25 NOTE — Telephone Encounter (Signed)
Called and spoke with the pt on (04/22/17) and informed her that Dr. Tempie DonningWendling okayed the refill on the Lexpro.  New rx sent to the pharmacy by e-script.  Pt verbalized understanding.//AB/CMA

## 2017-05-03 ENCOUNTER — Encounter (HOSPITAL_BASED_OUTPATIENT_CLINIC_OR_DEPARTMENT_OTHER): Payer: Self-pay | Admitting: *Deleted

## 2017-05-03 ENCOUNTER — Emergency Department (HOSPITAL_BASED_OUTPATIENT_CLINIC_OR_DEPARTMENT_OTHER)
Admission: EM | Admit: 2017-05-03 | Discharge: 2017-05-03 | Disposition: A | Discharge status: Home / Self Care | Payer: Medicaid Other | Attending: Emergency Medicine | Admitting: Emergency Medicine

## 2017-05-03 DIAGNOSIS — Z3A11 11 weeks gestation of pregnancy: Secondary | ICD-10-CM | POA: Diagnosis not present

## 2017-05-03 DIAGNOSIS — E876 Hypokalemia: Secondary | ICD-10-CM | POA: Diagnosis not present

## 2017-05-03 DIAGNOSIS — R109 Unspecified abdominal pain: Secondary | ICD-10-CM | POA: Diagnosis present

## 2017-05-03 DIAGNOSIS — Z79899 Other long term (current) drug therapy: Secondary | ICD-10-CM | POA: Diagnosis not present

## 2017-05-03 DIAGNOSIS — O021 Missed abortion: Secondary | ICD-10-CM

## 2017-05-03 LAB — ABO/RH: ABO/RH(D): O POS

## 2017-05-03 LAB — CBC
HCT: 38.2 % (ref 36.0–46.0)
HEMOGLOBIN: 13 g/dL (ref 12.0–15.0)
MCH: 30 pg (ref 26.0–34.0)
MCHC: 34 g/dL (ref 30.0–36.0)
MCV: 88 fL (ref 78.0–100.0)
PLATELETS: 269 10*3/uL (ref 150–400)
RBC: 4.34 MIL/uL (ref 3.87–5.11)
RDW: 13.4 % (ref 11.5–15.5)
WBC: 9.1 10*3/uL (ref 4.0–10.5)

## 2017-05-03 LAB — BASIC METABOLIC PANEL
ANION GAP: 9 (ref 5–15)
CO2: 23 mmol/L (ref 22–32)
CREATININE: 0.42 mg/dL — AB (ref 0.44–1.00)
Calcium: 8.8 mg/dL — ABNORMAL LOW (ref 8.9–10.3)
Chloride: 104 mmol/L (ref 101–111)
GFR calc Af Amer: >60 mL/min (ref >60)
GFR calc non Af Amer: >60 mL/min (ref >60)
GLUCOSE: 92 mg/dL (ref 65–99)
Potassium: 3.3 mmol/L — ABNORMAL LOW (ref 3.5–5.1)
Sodium: 136 mmol/L (ref 135–145)

## 2017-05-03 MED ORDER — MORPHINE SULFATE (PF) 4 MG/ML IV SOLN
4.0000 mg | Freq: Once | INTRAVENOUS | Status: AC
  Administered 2017-05-03: 4 mg via INTRAVENOUS
  Filled 2017-05-03: qty 1

## 2017-05-03 MED ORDER — MISOPROSTOL 200 MCG PO TABS: 600.0000 ug | ORAL_TABLET | Freq: Once | ORAL | 1 refills | Status: DC

## 2017-05-03 MED ORDER — POTASSIUM CHLORIDE CRYS ER 20 MEQ PO TBCR
40.0000 meq | EXTENDED_RELEASE_TABLET | Freq: Once | ORAL | Status: AC
  Administered 2017-05-03: 40 meq via ORAL
  Filled 2017-05-03: qty 2

## 2017-05-03 MED ORDER — SODIUM CHLORIDE 0.9 % IV SOLN
INTRAVENOUS | Status: DC
Start: 2017-05-03 — End: 2017-05-03
  Administered 2017-05-03: 11:00:00 via INTRAVENOUS

## 2017-05-03 NOTE — ED Notes (Signed)
[facility] @ [phone] at Dr. Ronnette HilaJ's request.

## 2017-05-03 NOTE — ED Notes (Signed)
Patient reports abdominal cramping x 2 days.  Denies vaginal bleeding.  Patient seen beginning of August for pregnancy and had ultrasound which confirmed no fetal heart beat.  Patient states she has been trying to call OB office to receive cytotec but has not received a call back.  Per ob notes patient was to wait 4 weeks and if no bleeding at that time she would receive cytotec.

## 2017-05-03 NOTE — Discharge Instructions (Signed)
Take the medication as prescribed. If you do not pass the fetus in 48 hours, you can take a second dose which is written as a refill. The women's outpatient clinic will call you to see how you are doing. If you don't hear from them by tomorrow afternoon, call and tell office staff that Dr.Aristidis Talerico spoke with Dr. Adrian BlackwaterStinson about your case. If you feel that you need further evaluation or feel worse for any reason between now and the time your reevaluated by the women's outpatient clinic go directly to the [facility] at [facility].

## 2017-05-03 NOTE — ED Provider Notes (Addendum)
[facility]   CSN: [id number] Arrival date & time: 05/03/17  1015     History   Chief Complaint Chief Complaint  Patient presents with  . Abdominal Pain    HPI Tammy Mejia is a 26 y.o. female.Patient developed low crampy abdominal pain this morning. Pain is nonradiating. Patient had pelvic ultrasound performed 04/20/2017 which showed 9 week 1 day intrauterine pregnancy with fetal heart tones not detected, consistent with nonviable pregnancy. She was given the option of Cytotec versus watchful waiting versus D&C. She elected for watchful waiting. She developed abdominal cramping today. She denies vaginal bleeding denies fever denies nausea or vomiting. No other associated symptoms  HPI  Past Medical History:  Diagnosis Date  . Back pain   . Deficiency of potassium   . Hypokalemia   . Migraine     Patient Active Problem List   Diagnosis Date Noted  . Anxiety and depression 03/16/2016  . Dental infection 06/05/2015  . Migraine 08/23/2014  . Insomnia 06/20/2014  . Dysmenorrhea 06/20/2014  . Palpitations 06/29/2013  . Hypokalemia 06/29/2013    History reviewed. No pertinent surgical history.  OB History    Gravida Para Term Preterm AB Living   1             SAB TAB Ectopic Multiple Live Births                   Home Medications    Prior to Admission medications   Medication Sig Start Date End Date Taking? Authorizing Provider  Melatonin 1 MG CAPS Take by mouth.   Yes [provider]  potassium chloride (KLOR-CON 10) 10 MEQ tablet Take 1 tablet (10 mEq total) by mouth daily. 01/10/17  Yes Wendling, Jilda Roche, DO  Prenatal Vit-Fe Fumarate-FA (MULTIVITAMIN-PRENATAL) 27-0.8 MG TABS tablet Take 1 tablet by mouth daily at 12 noon.   Yes [provider]  escitalopram (LEXAPRO) 10 MG tablet Take 1 tablet (10 mg total) by mouth daily. 04/22/17   Sharlene Dory, DO    Family History Family History  Problem  Relation Age of Onset  . COPD Mother   . Diabetes Father   . Cancer Neg Hx   . Hypertension Neg Hx   . Stroke Neg Hx     Social History Social History  Substance Use Topics  . Smoking status: Never Smoker  . Smokeless tobacco: Never Used  . Alcohol use No   No illicit drug use  Allergies   Patient has no known allergies.   Review of Systems Review of Systems  Constitutional: Negative.   HENT: Negative.   Respiratory: Negative.   Cardiovascular: Negative.   Gastrointestinal: Positive for abdominal pain.  Musculoskeletal: Negative.   Skin: Negative.   Neurological: Negative.   Psychiatric/Behavioral: Negative.   All other systems reviewed and are negative.    Physical Exam Updated Vital Signs BP 106/81 (BP Location: Right Arm)   Pulse (!) 59   Temp 98.2 F (36.8 C) (Oral)   Resp 16   Ht 5\' 1"  (1.549 m)   Wt 47.2 kg (104 lb)   LMP 02/08/2017   SpO2 100%   BMI 19.65 kg/m   Physical Exam  Constitutional: She appears well-developed and well-nourished.  HENT:  Head: Normocephalic and atraumatic.  Eyes: Pupils are equal, round, and reactive to light. Conjunctivae are normal.  Neck: Neck supple. No tracheal deviation present. No thyromegaly present.  Cardiovascular: Normal rate and regular rhythm.  No murmur heard. Pulmonary/Chest: Effort normal and breath sounds normal.  Abdominal: Soft. Bowel sounds are normal. She exhibits no distension. There is no tenderness.  Genitourinary:  Genitourinary Comments: Pelvic exam no external lesion. No discharge. No blood in vault. Cervical os closed. Uterine fundus is tender. No adnexal masses or tenderness  Musculoskeletal: Normal range of motion. She exhibits no edema or tenderness.  Neurological: She is alert. Coordination normal.  Skin: Skin is warm and dry. No rash noted.  Psychiatric: She has a normal mood and affect.  Nursing note and vitals reviewed.    ED Treatments / Results  Labs (all labs ordered are  listed, but only abnormal results are displayed) Labs Reviewed  CBC  ABO/RH    EKG  EKG Interpretation None       Radiology No results found.  Procedures Procedures (including critical care time)  Medications Ordered in ED Medications  morphine 4 MG/ML injection 4 mg (not administered)   Results for orders placed or performed during the hospital encounter of 05/03/17  CBC  Result Value Ref Range   WBC 9.1 4.0 - 10.5 K/uL   RBC 4.34 3.87 - 5.11 MIL/uL   Hemoglobin 13.0 12.0 - 15.0 g/dL   HCT 16.138.2 09.636.0 - 04.546.0 %   MCV 88.0 78.0 - 100.0 fL   MCH 30.0 26.0 - 34.0 pg   MCHC 34.0 30.0 - 36.0 g/dL   RDW 40.913.4 81.111.5 - 91.415.5 %   Platelets 269 150 - 400 K/uL  Basic metabolic panel  Result Value Ref Range   Sodium 136 135 - 145 mmol/L   Potassium 3.3 (L) 3.5 - 5.1 mmol/L   Chloride 104 101 - 111 mmol/L   CO2 23 22 - 32 mmol/L   Glucose, Bld 92 65 - 99 mg/dL   BUN <5 (L) 6 - 20 mg/dL   Creatinine, Ser 7.820.42 (L) 0.44 - 1.00 mg/dL   Calcium 8.8 (L) 8.9 - 10.3 mg/dL   GFR calc non Af Amer >60 >60 mL/min   GFR calc Af Amer >60 >60 mL/min   Anion gap 9 5 - 15   [place] Ob Comp Less 14 Wks  Result Date: 04/20/2017 CLINICAL DATA:  No heartbeat detected. EXAM: OBSTETRIC <14 WK US AND TRANSVAGINAL OB US TECHNIQUE: Both transabdominal and transvaginal ultrasound examinations were performed for complete evaluation of the gestation as well as the maternal uterus, adnexal regions, and pelvic cul-de-sac. Transvaginal technique was performed to assess early pregnancy. COMPARISON:  None FINDINGS: Intrauterine gestational sac: Single Yolk sac:  Visualized Embryo:  Visualized Cardiac Activity: Not visualized Heart Rate:   bpm MSD:   mm    w     d CRL:  24.5  mm   9 w   1 d                  US EDC: 11/22/2017 Subchorionic hemorrhage:  None visualized. Maternal uterus/adnexae: No adnexal mass or free fluid. IMPRESSION: Nine week 1 day intrauterine pregnancy. Fetal heart tones not detected. Findings meet  definitive criteria for failed pregnancy. This follows SRU consensus guidelines: Diagnostic Criteria for Nonviable Pregnancy Early in the First Trimester. Macy Mis Engl J Med [phone]:1443-51. Electronically Signed   By: Charlett NoseKevin  Dover M.D.   On: 04/20/2017 14:56   [place] Ob Transvaginal  Result Date: 04/20/2017 CLINICAL DATA:  No heartbeat detected. EXAM: OBSTETRIC <14 WK US AND TRANSVAGINAL OB US TECHNIQUE: Both transabdominal and transvaginal ultrasound examinations were performed for complete evaluation of the gestation  as well as the maternal uterus, adnexal regions, and pelvic cul-de-sac. Transvaginal technique was performed to assess early pregnancy. COMPARISON:  None FINDINGS: Intrauterine gestational sac: Single Yolk sac:  Visualized Embryo:  Visualized Cardiac Activity: Not visualized Heart Rate:   bpm MSD:   mm    w     d CRL:  24.5  mm   9 w   1 d                  [place] EDC: 11/22/2017 Subchorionic hemorrhage:  None visualized. Maternal uterus/adnexae: No adnexal mass or free fluid. IMPRESSION: Nine week 1 day intrauterine pregnancy. Fetal heart tones not detected. Findings meet definitive criteria for failed pregnancy. This follows SRU consensus guidelines: Diagnostic Criteria for Nonviable Pregnancy Early in the First Trimester. Macy Mis J Med [id number]. Electronically Signed   By: Charlett Nose M.D.   On: 04/20/2017 14:56   Initial Impression / Assessment and Plan / ED Course  I have reviewed the triage vital signs and the nursing notes.  Pertinent labs & imaging results that were available during my care of the patient were reviewed by me and considered in my medical decision making (see chart for details).     Case discussed with Dr. Adrian Blackwater from GYN service. Plan prescription Cytotec 600 mg between cheek and gum 1 dose with one refill if she does not pass fetus within 48 hours. ABO/Rh is pending which he will check. Clinic to call patient to see how she is doing. She received potassium chloride 40  mEq by mouth prior to discharge  Final Clinical Impressions(s) / ED Diagnoses  Diagnosis#1 Missed abortion #2 hypokalemia Final diagnoses:  None    New Prescriptions New Prescriptions   No medications on file     Doug Sou, MD 05/03/17 1206    Doug Sou, MD 05/03/17 1207

## 2017-05-03 NOTE — ED Triage Notes (Signed)
Pt reports she's approx [redacted]wks pregnant but that an ultrasound at approx 10wks revealed no fetal heartbeat. Presents today with abd cramping. Denies bleeding, fever.

## 2017-05-06 ENCOUNTER — Ambulatory Visit (INDEPENDENT_AMBULATORY_CARE_PROVIDER_SITE_OTHER): Payer: Medicaid Other | Admitting: Family Medicine

## 2017-05-06 ENCOUNTER — Encounter: Payer: Self-pay | Admitting: Family Medicine

## 2017-05-06 VITALS — BP 99/63 | HR 68 | Ht 61.0 in | Wt 104.0 lb

## 2017-05-06 DIAGNOSIS — O021 Missed abortion: Secondary | ICD-10-CM

## 2017-05-06 MED ORDER — MISOPROSTOL 200 MCG PO TABS: 600.0000 ug | ORAL_TABLET | Freq: Once | ORAL | 0 refills | Status: DC

## 2017-05-06 NOTE — Progress Notes (Signed)
   Subjective:    Patient ID: Tammy Mejia, female    DOB: 1991-08-25, 26 y.o.   MRN: [id number]  HPI  Agency in for follow-up of missed AB, which was diagnosed on 8/1. She was seen in the ER on 8/14 with cramping and bleeding. She was given a dose of Cytotec at that time. She did have more cramping, but passed no clots or tissue.   Review of Systems     Objective:   Physical Exam  Constitutional: She appears well-developed and well-nourished.  HENT:  Head: Normocephalic and atraumatic.  Cardiovascular: Normal rate, regular rhythm and normal heart sounds.   Pulmonary/Chest: Effort normal and breath sounds normal. No respiratory distress. She has no wheezes.  Abdominal: Soft. She exhibits no distension. There is tenderness (suprapubic). There is no rebound and no guarding.  Skin: Skin is warm and dry.  Psychiatric: She has a normal mood and affect. Her behavior is normal. Judgment and thought content normal.      Assessment & Plan:  1. Missed ab We'll give second dose of Cytotec. Discussed possibility of needing D and E, which patient would like to avoid if possible. Discussed what to watch for. Patient to call in 3-4 days if she has not passed products of conception.

## 2017-05-06 NOTE — Progress Notes (Signed)
Patient states that she had a lot of pain but not much bleeding- just a brown discharge. Armandina Stammer RNBSN

## 2017-05-09 ENCOUNTER — Encounter: Payer: Self-pay | Admitting: Family Medicine

## 2017-05-09 ENCOUNTER — Encounter (HOSPITAL_COMMUNITY): Payer: Self-pay

## 2017-05-09 DIAGNOSIS — O021 Missed abortion: Secondary | ICD-10-CM | POA: Diagnosis present

## 2017-05-09 NOTE — H&P (Signed)
  Tammy Mejia is an 26 y.o. G1P0 female.   Chief Complaint: 9 wk missed AB HPI: s/p cytotec x 2. U/s shows 9 wk CRL with no FHR. For definitive treatment  Past Medical History:  Diagnosis Date  . Back pain   . Deficiency of potassium   . Hypokalemia   . Migraine     No past surgical history on file.  Family History  Problem Relation Age of Onset  . COPD Mother   . Diabetes Father   . Cancer Neg Hx   . Hypertension Neg Hx   . Stroke Neg Hx    Social History:  reports that she has never smoked. She has never used smokeless tobacco. She reports that she does not drink alcohol or use drugs.  Allergies: No Known Allergies  No current facility-administered medications on file prior to encounter.    Current Outpatient Prescriptions on File Prior to Encounter  Medication Sig Dispense Refill  . escitalopram (LEXAPRO) 10 MG tablet Take 1 tablet (10 mg total) by mouth daily. 30 tablet 0  . Melatonin 1 MG CAPS Take by mouth.    . misoprostol (CYTOTEC) 200 MCG tablet Take 3 tablets (600 mcg total) by mouth once. Place tablet between cheek and gum. 3 tablet 0  . potassium chloride (KLOR-CON 10) 10 MEQ tablet Take 1 tablet (10 mEq total) by mouth daily. 90 tablet 2  . Prenatal Vit-Fe Fumarate-FA (MULTIVITAMIN-PRENATAL) 27-0.8 MG TABS tablet Take 1 tablet by mouth daily at 12 noon.      A comprehensive review of systems was negative.  Last menstrual period 02/08/2017. General appearance: alert, cooperative and appears stated age Head: Normocephalic, without obvious abnormality, atraumatic Neck: supple, symmetrical, trachea midline Lungs: normal effort Heart: regular rate and rhythm Abdomen: soft, non-tender; bowel sounds normal; no masses,  no organomegaly Extremities: extremities normal, atraumatic, no cyanosis or edema Skin: Skin color, texture, turgor normal. No rashes or lesions Neurologic: Grossly normal   Lab Results  Component Value Date   WBC 9.1 05/03/2017   HGB 13.0 05/03/2017   HCT 38.2 05/03/2017   MCV 88.0 05/03/2017   PLT 269 05/03/2017   O POS   Assessment/Plan Principal Problem:   Missed ab  For Suction D & C. Risks include but are not limited to bleeding, infection, injury to surrounding structures, including bowel, bladder and ureters, blood clots, and death.  Likelihood of success is high.    Reva Bores 05/09/2017, 4:47 PM

## 2017-05-10 ENCOUNTER — Encounter (HOSPITAL_COMMUNITY): Payer: Self-pay | Admitting: *Deleted

## 2017-05-10 ENCOUNTER — Encounter: Payer: Self-pay | Admitting: Family Medicine

## 2017-05-10 MED ORDER — IBUPROFEN 800 MG PO TABS: 800.0000 mg | ORAL_TABLET | Freq: Three times a day (TID) | ORAL | 1 refills | Status: DC | PRN

## 2017-05-12 ENCOUNTER — Encounter (HOSPITAL_COMMUNITY): Payer: Self-pay | Admitting: Anesthesiology

## 2017-05-12 NOTE — Anesthesia Preprocedure Evaluation (Addendum)
Anesthesia Evaluation  Patient identified by MRN, date of birth, ID band Patient awake    Reviewed: Allergy & Precautions, NPO status , Patient's Chart, lab work & pertinent test results  Airway Mallampati: I  TM Distance: >3 FB Neck ROM: Full    Dental no notable dental hx. (+) Poor Dentition, Chipped, Dental Advisory Given Numerous carious and chipped teeth:   Pulmonary neg pulmonary ROS,    Pulmonary exam normal breath sounds clear to auscultation       Cardiovascular negative cardio ROS Normal cardiovascular exam Rhythm:Regular Rate:Normal     Neuro/Psych  Headaches, PSYCHIATRIC DISORDERS Anxiety Depression    GI/Hepatic negative GI ROS, Neg liver ROS,   Endo/Other  negative endocrine ROS  Renal/GU negative Renal ROS  negative genitourinary   Musculoskeletal negative musculoskeletal ROS (+)   Abdominal   Peds  Hematology negative hematology ROS (+)   Anesthesia Other Findings Nose piercing and skin piercing face  Reproductive/Obstetrics Missed Ab                           Anesthesia Physical Anesthesia Plan  ASA: II  Anesthesia Plan: MAC   Post-op Pain Management:    Induction:   PONV Risk Score and Plan: 3 and Ondansetron, Dexamethasone, Midazolam and Propofol infusion  Airway Management Planned: Natural Airway, Nasal Cannula and Simple Face Mask  Additional Equipment:   Intra-op Plan: Utilization Of Total Body Hypothermia per surgeon request  Post-operative Plan:   Informed Consent: I have reviewed the patients History and Physical, chart, labs and discussed the procedure including the risks, benefits and alternatives for the proposed anesthesia with the patient or authorized representative who has indicated his/her understanding and acceptance.   Dental advisory given  Plan Discussed with: CRNA, Anesthesiologist and Surgeon  Anesthesia Plan Comments:         Anesthesia Quick Evaluation

## 2017-05-13 ENCOUNTER — Encounter (HOSPITAL_COMMUNITY)
Admission: RE | Disposition: A | Discharge status: Home / Self Care | Payer: Self-pay | Source: Ambulatory Visit | Attending: Family Medicine

## 2017-05-13 ENCOUNTER — Encounter (HOSPITAL_COMMUNITY): Payer: Self-pay | Admitting: *Deleted

## 2017-05-13 ENCOUNTER — Ambulatory Visit (HOSPITAL_COMMUNITY): Payer: Medicaid Other | Admitting: Anesthesiology

## 2017-05-13 ENCOUNTER — Ambulatory Visit (HOSPITAL_COMMUNITY)
Admission: RE | Admit: 2017-05-13 | Discharge: 2017-05-13 | Disposition: A | Discharge status: Home / Self Care | Payer: Medicaid Other | Source: Ambulatory Visit | Attending: Family Medicine | Admitting: Family Medicine

## 2017-05-13 DIAGNOSIS — Z791 Long term (current) use of non-steroidal anti-inflammatories (NSAID): Secondary | ICD-10-CM | POA: Diagnosis not present

## 2017-05-13 DIAGNOSIS — Z79899 Other long term (current) drug therapy: Secondary | ICD-10-CM | POA: Insufficient documentation

## 2017-05-13 DIAGNOSIS — O021 Missed abortion: Secondary | ICD-10-CM | POA: Diagnosis present

## 2017-05-13 HISTORY — DX: Anxiety disorder, unspecified: F41.9

## 2017-05-13 HISTORY — DX: Depression, unspecified: F32.A

## 2017-05-13 HISTORY — PX: DILATION AND EVACUATION: SHX1459

## 2017-05-13 HISTORY — DX: Major depressive disorder, single episode, unspecified: F32.9

## 2017-05-13 SURGERY — DILATION AND EVACUATION, UTERUS
Anesthesia: Monitor Anesthesia Care

## 2017-05-13 MED ORDER — ONDANSETRON HCL 4 MG/2ML IJ SOLN
INTRAMUSCULAR | Status: DC | PRN
  Administered 2017-05-13: 4 mg via INTRAVENOUS

## 2017-05-13 MED ORDER — PROPOFOL 500 MG/50ML IV EMUL
INTRAVENOUS | Status: DC | PRN
  Administered 2017-05-13: 75 ug/kg/min via INTRAVENOUS

## 2017-05-13 MED ORDER — MIDAZOLAM HCL 2 MG/2ML IJ SOLN
INTRAMUSCULAR | Status: DC | PRN
  Administered 2017-05-13 (×2): 1 mg via INTRAVENOUS

## 2017-05-13 MED ORDER — PROPOFOL 500 MG/50ML IV EMUL
INTRAVENOUS | Status: DC | PRN
  Administered 2017-05-13: 10 mg via INTRAVENOUS

## 2017-05-13 MED ORDER — FENTANYL CITRATE (PF) 250 MCG/5ML IJ SOLN
INTRAMUSCULAR | Status: AC
  Filled 2017-05-13: qty 5

## 2017-05-13 MED ORDER — KETOROLAC TROMETHAMINE 30 MG/ML IJ SOLN
INTRAMUSCULAR | Status: DC | PRN
  Administered 2017-05-13: 15 mg via INTRAVENOUS

## 2017-05-13 MED ORDER — PROPOFOL 10 MG/ML IV BOLUS
INTRAVENOUS | Status: AC
  Filled 2017-05-13: qty 20

## 2017-05-13 MED ORDER — LIDOCAINE HCL (CARDIAC) 20 MG/ML IV SOLN
INTRAVENOUS | Status: DC | PRN
  Administered 2017-05-13: 40 mg via INTRAVENOUS

## 2017-05-13 MED ORDER — SCOPOLAMINE 1 MG/3DAYS TD PT72
MEDICATED_PATCH | TRANSDERMAL | Status: AC
  Filled 2017-05-13: qty 1

## 2017-05-13 MED ORDER — LACTATED RINGERS IV SOLN
INTRAVENOUS | Status: DC
  Administered 2017-05-13: 1000 mL via INTRAVENOUS

## 2017-05-13 MED ORDER — BUPIVACAINE-EPINEPHRINE 0.25% -1:200000 IJ SOLN
INTRAMUSCULAR | Status: DC | PRN
  Administered 2017-05-13: 20 mL

## 2017-05-13 MED ORDER — METOCLOPRAMIDE HCL 5 MG/ML IJ SOLN: 10.0000 mg | Freq: Once | INTRAMUSCULAR | Status: DC | PRN

## 2017-05-13 MED ORDER — KETOROLAC TROMETHAMINE 30 MG/ML IJ SOLN
INTRAMUSCULAR | Status: AC
  Filled 2017-05-13: qty 1

## 2017-05-13 MED ORDER — BUPIVACAINE HCL (PF) 0.25 % IJ SOLN
INTRAMUSCULAR | Status: AC
  Filled 2017-05-13: qty 30

## 2017-05-13 MED ORDER — FENTANYL CITRATE (PF) 100 MCG/2ML IJ SOLN
INTRAMUSCULAR | Status: DC | PRN
  Administered 2017-05-13: 50 ug via INTRAVENOUS

## 2017-05-13 MED ORDER — LACTATED RINGERS IV SOLN
INTRAVENOUS | Status: DC
  Administered 2017-05-13: 09:00:00 via INTRAVENOUS

## 2017-05-13 MED ORDER — DOXYCYCLINE HYCLATE 100 MG IV SOLR
100.0000 mg | INTRAVENOUS | Status: AC
  Administered 2017-05-13: 100 mg via INTRAVENOUS
  Filled 2017-05-13: qty 100

## 2017-05-13 MED ORDER — FENTANYL CITRATE (PF) 100 MCG/2ML IJ SOLN: 25.0000 ug | INTRAMUSCULAR | Status: DC | PRN

## 2017-05-13 MED ORDER — LIDOCAINE HCL (CARDIAC) 20 MG/ML IV SOLN
INTRAVENOUS | Status: AC
  Filled 2017-05-13: qty 5

## 2017-05-13 MED ORDER — FENTANYL CITRATE (PF) 100 MCG/2ML IJ SOLN
INTRAMUSCULAR | Status: AC
  Filled 2017-05-13: qty 2

## 2017-05-13 MED ORDER — PROPOFOL 10 MG/ML IV BOLUS
INTRAVENOUS | Status: AC
  Filled 2017-05-13: qty 40

## 2017-05-13 MED ORDER — SCOPOLAMINE 1 MG/3DAYS TD PT72
1.0000 | MEDICATED_PATCH | Freq: Once | TRANSDERMAL | Status: DC
  Administered 2017-05-13: 1.5 mg via TRANSDERMAL

## 2017-05-13 MED ORDER — MIDAZOLAM HCL 2 MG/2ML IJ SOLN
INTRAMUSCULAR | Status: AC
  Filled 2017-05-13: qty 2

## 2017-05-13 MED ORDER — DEXAMETHASONE SODIUM PHOSPHATE 10 MG/ML IJ SOLN
INTRAMUSCULAR | Status: DC | PRN
  Administered 2017-05-13: 4 mg via INTRAVENOUS

## 2017-05-13 MED ORDER — ONDANSETRON HCL 4 MG/2ML IJ SOLN
INTRAMUSCULAR | Status: AC
  Filled 2017-05-13: qty 2

## 2017-05-13 MED ORDER — MEPERIDINE HCL 25 MG/ML IJ SOLN: 6.2500 mg | INTRAMUSCULAR | Status: DC | PRN

## 2017-05-13 MED ORDER — DEXAMETHASONE SODIUM PHOSPHATE 10 MG/ML IJ SOLN
INTRAMUSCULAR | Status: AC
  Filled 2017-05-13: qty 1

## 2017-05-13 MED ORDER — HYDROCODONE-ACETAMINOPHEN 7.5-325 MG PO TABS: 1.0000 | ORAL_TABLET | Freq: Once | ORAL | Status: DC | PRN

## 2017-05-13 SURGICAL SUPPLY — 18 items
CATH ROBINSON RED A/P 16FR (CATHETERS) ×3 IMPLANT
CLOTH BEACON ORANGE TIMEOUT ST (SAFETY) ×3 IMPLANT
DECANTER SPIKE VIAL GLASS SM (MISCELLANEOUS) ×3 IMPLANT
GLOVE BIOGEL PI IND STRL 7.0 (GLOVE) ×2 IMPLANT
GLOVE BIOGEL PI INDICATOR 7.0 (GLOVE) ×4
GLOVE ECLIPSE 7.0 STRL STRAW (GLOVE) ×6 IMPLANT
GOWN STRL REUS W/TWL LRG LVL3 (GOWN DISPOSABLE) ×6 IMPLANT
KIT BERKELEY 1ST TRIMESTER 3/8 (MISCELLANEOUS) ×3 IMPLANT
NS IRRIG 1000ML POUR BTL (IV SOLUTION) ×3 IMPLANT
PACK VAGINAL MINOR WOMEN LF (CUSTOM PROCEDURE TRAY) ×3 IMPLANT
PAD OB MATERNITY 4.3X12.25 (PERSONAL CARE ITEMS) ×3 IMPLANT
PAD PREP 24X48 CUFFED NSTRL (MISCELLANEOUS) ×3 IMPLANT
SET BERKELEY SUCTION TUBING (SUCTIONS) ×3 IMPLANT
TOWEL OR 17X24 6PK STRL BLUE (TOWEL DISPOSABLE) ×6 IMPLANT
VACURETTE 10 RIGID CVD (CANNULA) IMPLANT
VACURETTE 7MM CVD STRL WRAP (CANNULA) IMPLANT
VACURETTE 8 RIGID CVD (CANNULA) ×2 IMPLANT
VACURETTE 9 RIGID CVD (CANNULA) IMPLANT

## 2017-05-13 NOTE — Discharge Instructions (Signed)

## 2017-05-13 NOTE — Transfer of Care (Signed)
Immediate Anesthesia Transfer of Care Note  Patient: Tammy FASIG  Procedure(s) Performed: Procedure(s): DILATATION AND EVACUATION (N/A)  Patient Location: PACU  Anesthesia Type:MAC  Level of Consciousness: awake, alert  and patient cooperative  Airway & Oxygen Therapy: Patient Spontanous Breathing  Post-op Assessment: Report given to RN and Post -op Vital signs reviewed and stable  Post vital signs: Reviewed and stable  Last Vitals:  Vitals:   05/13/17 0814  BP: 99/76  Pulse: 71  Resp: 16  Temp: 36.9 C  SpO2: 100%    Last Pain:  Vitals:   05/13/17 0814  TempSrc: Oral  PainSc: 4       Patients Stated Pain Goal: 3 (95/09/32 6712)  Complications: No apparent anesthesia complications

## 2017-05-13 NOTE — Interval H&P Note (Signed)
History and Physical Interval Note:  05/13/2017 8:51 AM  Tammy Mejia  has presented today for surgery, with the diagnosis of Missed AB  The various methods of treatment have been discussed with the patient and family. After consideration of risks, benefits and other options for treatment, the patient has consented to  Procedure(s): DILATATION AND EVACUATION (N/A) as a surgical intervention .  The patient's history has been reviewed, patient examined, no change in status, stable for surgery.  I have reviewed the patient's chart and labs.  Questions were answered to the patient's satisfaction.     Reva Bores

## 2017-05-13 NOTE — Op Note (Signed)
Tammy Mejia  PROCEDURE DATE: 05/13/2017  PREOPERATIVE DIAGNOSIS: 9 week missed abortion.  POSTOPERATIVE DIAGNOSIS: The same.  PROCEDURE:    Suction Dilation and Evacuation.  SURGEON:  Reva Bores  ANESTHESIA: No responsible provider has been recorded for the case.  INDICATIONS: 26 y.o. G1P0with MAB at [redacted] weeks gestation, needing surgical completion.  Risks of surgery were discussed with the patient including but not limited to: bleeding which may require transfusion; infection which may require antibiotics; injury to uterus or surrounding organs;need for additional procedures including laparotomy or laparoscopy; possibility of intrauterine scarring which may impair future fertility; and other postoperative/anesthesia complications. Written informed consent was obtained.    FINDINGS:  A 10 wk size antevertedterus, moderate amounts of products of conception, specimen sent to pathology.  ANESTHESIA:  Monitored intravenous sedation, paracervical block.  ESTIMATED BLOOD LOSS:  Less than 20 ml.  SPECIMENS:  Products of conception sent to pathology  COMPLICATIONS:  None immediate.  PROCEDURE DETAILS:  The patient received intravenous antibiotics while in the preoperative area.  She was then taken to the operating room where general anesthesia was administered and was found to be adequate.  After an adequate timeout was performed, she was placed in the dorsal lithotomy position and examined; then prepped and draped in the sterile manner.   Her bladder was catheterized for an unmeasured amount of clear, yellow urine. A vaginal speculum was then placed in the patient's vagina and a single tooth tenaculum was applied to the anterior lip of the cervix.  A paracervical block using 1% Lidocaine with Epinephrine was administered. The cervix was gently dilated to accommodate a 10 mm suction curette that was gently advanced to the uterine fundus.  The suction device was then activated and curette  slowly rotated to clear the uterus of products of conception.  A sharp curettage was then performed to confirm complete emptying of the uterus.There was minimal bleeding noted and the tenaculum removed with good hemostasis noted.  All insturmnent, needle and lap counts were correct x 2.The patient tolerated the procedure well.  The patient was taken to the recovery area in stable condition.  Reva Bores 05/13/2017 10:34 AM

## 2017-05-14 NOTE — Anesthesia Postprocedure Evaluation (Signed)
Anesthesia Post Note  Patient: Tammy Mejia  Procedure(s) Performed: Procedure(s) (LRB): DILATATION AND EVACUATION (N/A)     Patient location during evaluation: PACU Anesthesia Type: MAC Level of consciousness: awake and alert and oriented Pain management: pain level controlled Vital Signs Assessment: post-procedure vital signs reviewed and stable Respiratory status: spontaneous breathing, nonlabored ventilation and respiratory function stable Cardiovascular status: stable and blood pressure returned to baseline Postop Assessment: no signs of nausea or vomiting Anesthetic complications: no    Last Vitals:  Vitals:   05/13/17 1035 05/13/17 1107  BP:  (!) 99/59  Pulse: (!) 53 (!) 52  Resp: 16 18  Temp: 36.8 C   SpO2: 100% 100%    Last Pain:  Vitals:   05/13/17 1035  TempSrc: Oral  PainSc:    Pain Goal: Patients Stated Pain Goal: 3 (05/13/17 0814)               Vienne Corcoran A.

## 2017-05-15 ENCOUNTER — Encounter (HOSPITAL_COMMUNITY): Payer: Self-pay | Admitting: Family Medicine

## 2017-05-17 ENCOUNTER — Emergency Department (HOSPITAL_BASED_OUTPATIENT_CLINIC_OR_DEPARTMENT_OTHER)
Admission: EM | Admit: 2017-05-17 | Discharge: 2017-05-17 | Disposition: A | Discharge status: Home / Self Care | Payer: Medicaid Other | Attending: Emergency Medicine | Admitting: Emergency Medicine

## 2017-05-17 ENCOUNTER — Emergency Department (HOSPITAL_BASED_OUTPATIENT_CLINIC_OR_DEPARTMENT_OTHER): Payer: Medicaid Other

## 2017-05-17 ENCOUNTER — Encounter (HOSPITAL_BASED_OUTPATIENT_CLINIC_OR_DEPARTMENT_OTHER): Payer: Self-pay | Admitting: *Deleted

## 2017-05-17 DIAGNOSIS — Z79899 Other long term (current) drug therapy: Secondary | ICD-10-CM | POA: Insufficient documentation

## 2017-05-17 DIAGNOSIS — N939 Abnormal uterine and vaginal bleeding, unspecified: Secondary | ICD-10-CM | POA: Diagnosis not present

## 2017-05-17 DIAGNOSIS — R102 Pelvic and perineal pain: Secondary | ICD-10-CM | POA: Diagnosis not present

## 2017-05-17 LAB — CBC WITH DIFFERENTIAL/PLATELET
BASOS PCT: 0 %
Basophils Absolute: 0 10*3/uL (ref 0.0–0.1)
Eosinophils Absolute: 0.4 10*3/uL (ref 0.0–0.7)
Eosinophils Relative: 4 %
HEMATOCRIT: 38.7 % (ref 36.0–46.0)
HEMOGLOBIN: 12.8 g/dL (ref 12.0–15.0)
LYMPHS ABS: 2.7 10*3/uL (ref 0.7–4.0)
LYMPHS PCT: 30 %
MCH: 29.7 pg (ref 26.0–34.0)
MCHC: 33.1 g/dL (ref 30.0–36.0)
MCV: 89.8 fL (ref 78.0–100.0)
Monocytes Absolute: 1.3 10*3/uL — ABNORMAL HIGH (ref 0.1–1.0)
Monocytes Relative: 15 %
NEUTROS PCT: 51 %
Neutro Abs: 4.5 10*3/uL (ref 1.7–7.7)
Platelets: 253 10*3/uL (ref 150–400)
RBC: 4.31 MIL/uL (ref 3.87–5.11)
RDW: 12.8 % (ref 11.5–15.5)
WBC: 9 10*3/uL (ref 4.0–10.5)

## 2017-05-17 LAB — BASIC METABOLIC PANEL
Anion gap: 8 (ref 5–15)
BUN: 7 mg/dL (ref 6–20)
CHLORIDE: 107 mmol/L (ref 101–111)
CO2: 24 mmol/L (ref 22–32)
CREATININE: 0.57 mg/dL (ref 0.44–1.00)
Calcium: 9 mg/dL (ref 8.9–10.3)
GFR calc non Af Amer: >60 mL/min (ref >60)
Glucose, Bld: 94 mg/dL (ref 65–99)
Potassium: 3.3 mmol/L — ABNORMAL LOW (ref 3.5–5.1)
Sodium: 139 mmol/L (ref 135–145)

## 2017-05-17 LAB — URINALYSIS, MICROSCOPIC (REFLEX)

## 2017-05-17 LAB — URINALYSIS, ROUTINE W REFLEX MICROSCOPIC
Bilirubin Urine: NEGATIVE
Glucose, UA: NEGATIVE mg/dL
Ketones, ur: NEGATIVE mg/dL
Leukocytes, UA: NEGATIVE
NITRITE: NEGATIVE
Protein, ur: NEGATIVE mg/dL
SPECIFIC GRAVITY, URINE: 1.01 (ref 1.005–1.030)
pH: 7 (ref 5.0–8.0)

## 2017-05-17 LAB — WET PREP, GENITAL
CLUE CELLS WET PREP: NONE SEEN
SPERM: NONE SEEN
TRICH WET PREP: NONE SEEN
Yeast Wet Prep HPF POC: NONE SEEN

## 2017-05-17 LAB — PREGNANCY, URINE: Preg Test, Ur: POSITIVE — AB

## 2017-05-17 MED ORDER — ONDANSETRON HCL 4 MG/2ML IJ SOLN
4.0000 mg | Freq: Once | INTRAMUSCULAR | Status: AC
  Administered 2017-05-17: 4 mg via INTRAVENOUS
  Filled 2017-05-17: qty 2

## 2017-05-17 MED ORDER — SODIUM CHLORIDE 0.9 % IV BOLUS (SEPSIS)
1000.0000 mL | Freq: Once | INTRAVENOUS | Status: AC
  Administered 2017-05-17: 1000 mL via INTRAVENOUS

## 2017-05-17 MED ORDER — MORPHINE SULFATE (PF) 2 MG/ML IV SOLN
2.0000 mg | Freq: Once | INTRAVENOUS | Status: AC
  Administered 2017-05-17: 2 mg via INTRAVENOUS
  Filled 2017-05-17: qty 1

## 2017-05-17 MED ORDER — ACETAMINOPHEN 500 MG PO TABS
1000.0000 mg | ORAL_TABLET | Freq: Once | ORAL | Status: AC
  Administered 2017-05-17: 1000 mg via ORAL
  Filled 2017-05-17: qty 2

## 2017-05-17 MED ORDER — KETOROLAC TROMETHAMINE 30 MG/ML IJ SOLN
15.0000 mg | Freq: Once | INTRAMUSCULAR | Status: AC
  Administered 2017-05-17: 15 mg via INTRAVENOUS
  Filled 2017-05-17: qty 1

## 2017-05-17 NOTE — ED Notes (Signed)
Unable to provide urine

## 2017-05-17 NOTE — ED Notes (Signed)
Patient transported to Ultrasound 

## 2017-05-17 NOTE — ED Triage Notes (Signed)
Pt had D&C on 8/24 (last Friday). States she has been having light bleeding until this afternoon at work the bleeding became heavy. This was around 430pm. Since then she has used 3 pads and had to change her clothes

## 2017-05-17 NOTE — ED Notes (Signed)
Pt in BR to obtain urine sample

## 2017-05-17 NOTE — Discharge Instructions (Signed)
Take 4 over the counter ibuprofen tablets 3 times a day or 2 over-the-counter naproxen tablets twice a day for pain. Also take tylenol 1000mg(2 extra strength) four times a day.    

## 2017-05-17 NOTE — ED Notes (Signed)
Report given to Jennaya, RN 

## 2017-05-17 NOTE — ED Provider Notes (Signed)
[facility] Note   CSN: [id number] Arrival date & time: 05/17/17  1737     History   Chief Complaint Chief Complaint  Patient presents with  . Vaginal Bleeding    HPI Tammy Mejia is a 26 y.o. female.  26 yo F with a chief complaint of vaginal bleeding. Patient was recently seen and had a DNC 4 days ago for a 9 week missed abortion. Patient had had 2 trials of Cervidil without spontaneous passage and so had to have the contents removed in the operating room. Since then she had had some faint spotting but over the past hour had a gush of blood. Large output according to the patient. Having some pelvic cramping. Denies feeling lightheaded like she'll pass out. Denies fevers or chills.    The history is provided by the patient.  Vaginal Bleeding  Primary symptoms include pelvic pain, vaginal bleeding.  Primary symptoms include no dysuria. There has been no fever. This is a new problem. The current episode started 1 to 2 hours ago. The problem occurs constantly. The problem has not changed since onset.The symptoms occur spontaneously. She is not pregnant. The discharge was bloody and copious. Pertinent negatives include no nausea, no vomiting and no dizziness. She has tried nothing for the symptoms. The treatment provided no relief. Sexual activity: non-contributory. She uses nothing for contraception. Associated medical issues include miscarriage.    Past Medical History:  Diagnosis Date  . Anxiety   . Back pain   . Deficiency of potassium   . Depression   . Hypokalemia   . Migraine     Patient Active Problem List   Diagnosis Date Noted  . Missed ab 05/09/2017  . Anxiety and depression 03/16/2016  . Migraine 08/23/2014  . Insomnia 06/20/2014  . Dysmenorrhea 06/20/2014  . Palpitations 06/29/2013    Past Surgical History:  Procedure Laterality Date  . DILATION AND EVACUATION N/A 05/13/2017   Procedure: DILATATION AND EVACUATION;  Surgeon:  Reva Bores, MD;  Location: [facility];  Service: Gynecology;  Laterality: N/A;    OB History    Gravida Para Term Preterm AB Living   1             SAB TAB Ectopic Multiple Live Births                   Home Medications    Prior to Admission medications   Medication Sig Start Date End Date Taking? Authorizing Provider  Melatonin 1 MG CAPS Take 1 mg by mouth at bedtime.    Yes [provider]  potassium chloride (KLOR-CON 10) 10 MEQ tablet Take 1 tablet (10 mEq total) by mouth daily. 01/10/17  Yes Wendling, Jilda Roche, DO  Prenatal Vit-Fe Fumarate-FA (MULTIVITAMIN-PRENATAL) 27-0.8 MG TABS tablet Take 1 tablet by mouth daily at 12 noon.   Yes [provider]  escitalopram (LEXAPRO) 10 MG tablet Take 1 tablet (10 mg total) by mouth daily. Patient not taking: Reported on 05/13/2017 04/22/17   Sharlene Dory, DO  ibuprofen (ADVIL,MOTRIN) 800 MG tablet Take 1 tablet (800 mg total) by mouth every 8 (eight) hours as needed for moderate pain. 05/10/17   Levie Heritage, DO    Family History Family History  Problem Relation Age of Onset  . COPD Mother   . Diabetes Father   . Cancer Neg Hx   . Hypertension Neg Hx   . Stroke Neg Hx     Social History  Social History  Substance Use Topics  . Smoking status: Never Smoker  . Smokeless tobacco: Never Used  . Alcohol use No     Allergies   Patient has no known allergies.   Review of Systems Review of Systems  Constitutional: Negative for chills and fever.  HENT: Negative for congestion and rhinorrhea.   Eyes: Negative for redness and visual disturbance.  Respiratory: Negative for shortness of breath and wheezing.   Cardiovascular: Negative for chest pain and palpitations.  Gastrointestinal: Negative for nausea and vomiting.  Genitourinary: Positive for pelvic pain and vaginal bleeding. Negative for dysuria and urgency.  Musculoskeletal: Negative for arthralgias and myalgias.  Skin: Negative for pallor  and wound.  Neurological: Negative for dizziness and headaches.     Physical Exam Updated Vital Signs BP 91/68   Pulse (!) 57   Temp 98.1 F (36.7 C) (Oral)   Resp 18   Ht 5\' 1"  (1.549 m)   Wt 47.2 kg (104 lb)   LMP 02/08/2017   SpO2 100%   Breastfeeding? No   BMI 19.65 kg/m   Physical Exam  Constitutional: She is oriented to person, place, and time. She appears well-developed and well-nourished. No distress.  HENT:  Head: Normocephalic and atraumatic.  Eyes: Pupils are equal, round, and reactive to light. EOM are normal.  Neck: Normal range of motion. Neck supple.  Cardiovascular: Normal rate and regular rhythm.  Exam reveals no gallop and no friction rub.   No murmur heard. Pulmonary/Chest: Effort normal. She has no wheezes. She has no rales.  Abdominal: Soft. She exhibits no distension and no mass. There is tenderness (suprapubic). There is no guarding.  Genitourinary: Cervix exhibits motion tenderness. Cervix exhibits no discharge and no friability.  Musculoskeletal: She exhibits no edema or tenderness.  Neurological: She is alert and oriented to person, place, and time.  Skin: Skin is warm and dry. She is not diaphoretic.  Psychiatric: She has a normal mood and affect. Her behavior is normal.  Nursing note and vitals reviewed.    ED Treatments / Results  Labs (all labs ordered are listed, but only abnormal results are displayed) Labs Reviewed  WET PREP, GENITAL - Abnormal; Notable for the following:       Result Value   WBC, Wet Prep HPF POC FEW (*)    All other components within normal limits  PREGNANCY, URINE - Abnormal; Notable for the following:    Preg Test, Ur POSITIVE (*)    All other components within normal limits  URINALYSIS, ROUTINE W REFLEX MICROSCOPIC - Abnormal; Notable for the following:    APPearance HAZY (*)    Hgb urine dipstick LARGE (*)    All other components within normal limits  CBC WITH DIFFERENTIAL/PLATELET - Abnormal; Notable for  the following:    Monocytes Absolute 1.3 (*)    All other components within normal limits  BASIC METABOLIC PANEL - Abnormal; Notable for the following:    Potassium 3.3 (*)    All other components within normal limits  URINALYSIS, MICROSCOPIC (REFLEX) - Abnormal; Notable for the following:    Bacteria, UA RARE (*)    Squamous Epithelial / LPF 0-5 (*)    All other components within normal limits  HIV ANTIBODY (ROUTINE TESTING)  RPR  GC/CHLAMYDIA PROBE AMP ([place]) NOT AT [facility]    EKG  EKG Interpretation None       Radiology US Transvaginal Non-ob  Result Date: 05/17/2017 CLINICAL DATA:  Status post D&C 4 days  ago following a failed pregnancy. EXAM: TRANSABDOMINAL AND TRANSVAGINAL ULTRASOUND OF PELVIS DOPPLER ULTRASOUND OF OVARIES TECHNIQUE: Both transabdominal and transvaginal ultrasound examinations of the pelvis were performed. Transabdominal technique was performed for global imaging of the pelvis including uterus, ovaries, adnexal regions, and pelvic cul-de-sac. It was necessary to proceed with endovaginal exam following the transabdominal exam to visualize the ovaries. Color and duplex Doppler ultrasound was utilized to evaluate blood flow to the ovaries. COMPARISON:  None. FINDINGS: Uterus Measurements: 8.7 x 4.8 x 5.6 cm. No fibroids or other mass visualized. Endometrium Thickness: 8 mm.  No focal abnormality visualized. Right ovary Measurements: 3.4 x 1.4 x 2.2 cm. Normal appearance/no adnexal mass. Left ovary Measurements: 3 x 1.8 x 1.6 cm. Normal appearance/no adnexal mass. Pulsed Doppler evaluation of both ovaries demonstrates normal low-resistance arterial and venous waveforms. Other findings No abnormal free fluid. IMPRESSION: Normal pelvic ultrasound. There is no retained products of conception. Electronically Signed   By: Sherian Rein M.D.   On: 05/17/2017 19:57   US Pelvis Complete  Result Date: 05/17/2017 CLINICAL DATA:  Status post D&C 4 days ago following a failed  pregnancy. EXAM: TRANSABDOMINAL AND TRANSVAGINAL ULTRASOUND OF PELVIS DOPPLER ULTRASOUND OF OVARIES TECHNIQUE: Both transabdominal and transvaginal ultrasound examinations of the pelvis were performed. Transabdominal technique was performed for global imaging of the pelvis including uterus, ovaries, adnexal regions, and pelvic cul-de-sac. It was necessary to proceed with endovaginal exam following the transabdominal exam to visualize the ovaries. Color and duplex Doppler ultrasound was utilized to evaluate blood flow to the ovaries. COMPARISON:  None. FINDINGS: Uterus Measurements: 8.7 x 4.8 x 5.6 cm. No fibroids or other mass visualized. Endometrium Thickness: 8 mm.  No focal abnormality visualized. Right ovary Measurements: 3.4 x 1.4 x 2.2 cm. Normal appearance/no adnexal mass. Left ovary Measurements: 3 x 1.8 x 1.6 cm. Normal appearance/no adnexal mass. Pulsed Doppler evaluation of both ovaries demonstrates normal low-resistance arterial and venous waveforms. Other findings No abnormal free fluid. IMPRESSION: Normal pelvic ultrasound. There is no retained products of conception. Electronically Signed   By: Sherian Rein M.D.   On: 05/17/2017 19:57   [place] Art/ven Flow Abd Pelv Doppler  Result Date: 05/17/2017 CLINICAL DATA:  Status post D&C 4 days ago following a failed pregnancy. EXAM: TRANSABDOMINAL AND TRANSVAGINAL ULTRASOUND OF PELVIS DOPPLER ULTRASOUND OF OVARIES TECHNIQUE: Both transabdominal and transvaginal ultrasound examinations of the pelvis were performed. Transabdominal technique was performed for global imaging of the pelvis including uterus, ovaries, adnexal regions, and pelvic cul-de-sac. It was necessary to proceed with endovaginal exam following the transabdominal exam to visualize the ovaries. Color and duplex Doppler ultrasound was utilized to evaluate blood flow to the ovaries. COMPARISON:  None. FINDINGS: Uterus Measurements: 8.7 x 4.8 x 5.6 cm. No fibroids or other mass visualized.  Endometrium Thickness: 8 mm.  No focal abnormality visualized. Right ovary Measurements: 3.4 x 1.4 x 2.2 cm. Normal appearance/no adnexal mass. Left ovary Measurements: 3 x 1.8 x 1.6 cm. Normal appearance/no adnexal mass. Pulsed Doppler evaluation of both ovaries demonstrates normal low-resistance arterial and venous waveforms. Other findings No abnormal free fluid. IMPRESSION: Normal pelvic ultrasound. There is no retained products of conception. Electronically Signed   By: Sherian Rein M.D.   On: 05/17/2017 19:57    Procedures Procedures (including critical care time)  Medications Ordered in ED Medications  acetaminophen (TYLENOL) tablet 1,000 mg (1,000 mg Oral Given 05/17/17 1846)  ketorolac (TORADOL) 30 MG/ML injection 15 mg (15 mg Intravenous Given 05/17/17 1853)  sodium chloride 0.9 % bolus 1,000 mL (0 mLs Intravenous Stopped 05/17/17 2024)  morphine 2 MG/ML injection 2 mg (2 mg Intravenous Given 05/17/17 1855)  ondansetron (ZOFRAN) injection 4 mg (4 mg Intravenous Given 05/17/17 1852)     Initial Impression / Assessment and Plan / ED Course  I have reviewed the triage vital signs and the nursing notes.  Pertinent labs & imaging results that were available during my care of the patient were reviewed by me and considered in my medical decision making (see chart for details).     26 yo F With a chief complaint of vaginal bleeding. Started about an hour ago. Called the OB/GYN office who told her the go to the ED.  Pelvic exam without significant bleeding. No noted clots. Significant cervical motion tenderness. No discharge. Ultrasound with no retained product of conception. No continued significant bleeding while in the ED. Hemoglobin is at baseline. Discharge home.  8:46 PM:  I have discussed the diagnosis/risks/treatment options with the patient and family and believe the pt to be eligible for discharge home to follow-up with GYN. We also discussed returning to the ED immediately if new  or worsening sx occur. We discussed the sx which are most concerning (e.g., sudden worsening pain, fever, inability to tolerate by mouth) that necessitate immediate return. Medications administered to the patient during their visit and any new prescriptions provided to the patient are listed below.  Medications given during this visit Medications  acetaminophen (TYLENOL) tablet 1,000 mg (1,000 mg Oral Given 05/17/17 1846)  ketorolac (TORADOL) 30 MG/ML injection 15 mg (15 mg Intravenous Given 05/17/17 1853)  sodium chloride 0.9 % bolus 1,000 mL (0 mLs Intravenous Stopped 05/17/17 2024)  morphine 2 MG/ML injection 2 mg (2 mg Intravenous Given 05/17/17 1855)  ondansetron (ZOFRAN) injection 4 mg (4 mg Intravenous Given 05/17/17 1852)     The patient appears reasonably screen and/or stabilized for discharge and I doubt any other medical condition or other [facility] requiring further screening, evaluation, or treatment in the ED at this time prior to discharge.    Final Clinical Impressions(s) / ED Diagnoses   Final diagnoses:  Vaginal bleeding    New Prescriptions Discharge Medication List as of 05/17/2017  8:05 PM       Melene Plan, DO 05/17/17 2046

## 2017-05-18 LAB — GC/CHLAMYDIA PROBE AMP (~~LOC~~) NOT AT ARMC
Chlamydia: NEGATIVE
Neisseria Gonorrhea: NEGATIVE

## 2017-05-18 LAB — HIV ANTIBODY (ROUTINE TESTING W REFLEX): HIV SCREEN 4TH GENERATION: NONREACTIVE

## 2017-05-18 LAB — RPR: RPR Ser Ql: NONREACTIVE

## 2017-06-01 ENCOUNTER — Ambulatory Visit (INDEPENDENT_AMBULATORY_CARE_PROVIDER_SITE_OTHER): Payer: Self-pay | Admitting: Family Medicine

## 2017-06-01 ENCOUNTER — Encounter: Payer: Self-pay | Admitting: Family Medicine

## 2017-06-01 VITALS — BP 100/74 | HR 99 | Temp 98.4°F | Ht 61.0 in | Wt 104.6 lb

## 2017-06-01 DIAGNOSIS — J020 Streptococcal pharyngitis: Secondary | ICD-10-CM

## 2017-06-01 LAB — POCT RAPID STREP A (OFFICE): Rapid Strep A Screen: POSITIVE — AB

## 2017-06-01 MED ORDER — AMOXICILLIN 500 MG PO CAPS: 500.0000 mg | ORAL_CAPSULE | Freq: Two times a day (BID) | ORAL | 0 refills | Status: AC

## 2017-06-01 NOTE — Patient Instructions (Signed)
Continue to push fluids, practice good hand hygiene, and cover your mouth if you cough.  If you start having fevers, shaking or shortness of breath, seek immediate care.  Throw out toothbrush after 24 hours of being on antibiotics.  Ibuprofen 400-600 mg (2-3 over the counter strength tabs) every 6 hours as needed for pain.  OK to take Tylenol 1000 mg (2 extra strength tabs) or 975 mg (3 regular strength tabs) every 6 hours as needed.  Let us know if you need anything.

## 2017-06-01 NOTE — Progress Notes (Signed)
Chief Complaint  Patient presents with  . Acute Visit    Pt is c/o sore throat, slight cough, sinus pressure, fatigue, sx started early tuesday am, slight body aches, chills     Tammy Mejia here for URI complaints.  Duration: 2 days  Associated symptoms: sinus congestion, rhinorrhea, sore throat and slight cough Denies: itchy watery eyes, ear pain, ear drainage, wheezing, shortness of breath, myalgia and fevers/rigors Treatment to date: Nyquil Sick contacts: Yes - on dad's side of the family  ROS:  Const: Denies fevers HEENT: As noted in HPI Lungs: No SOB  Past Medical History:  Diagnosis Date  . Anxiety   . Back pain   . Deficiency of potassium   . Depression   . Hypokalemia   . Migraine    Family History  Problem Relation Age of Onset  . COPD Mother   . Diabetes Father   . Cancer Neg Hx   . Hypertension Neg Hx   . Stroke Neg Hx     BP 100/74 (BP Location: Left Arm, Patient Position: Sitting, Cuff Size: Normal)   Pulse 99   Temp 98.4 F (36.9 C) (Oral)   Ht 5\' 1"  (1.549 m)   Wt 104 lb 9.6 oz (47.4 kg)   LMP 02/08/2017   SpO2 97%   BMI 19.76 kg/m  General: Awake, alert, appears stated age HEENT: AT, [place], ears patent b/l and TM's neg, milt TTP over the max sinsuses b/l, nares patent w/o discharge, pharynx erythematous, +exudates, no tonsillar asymmetry, MMM Neck: No masses or asymmetry, no tender adenopathy Heart: RRR, no murmurs Lungs: CTAB, no accessory muscle use Psych: Age appropriate judgment and insight, normal mood and affect  Strep throat - Plan: POCT rapid strep A  Orders as above. Rapid strep positive. Throw out toothbrush after 24 hrs of being on abx.  Continue to push fluids, practice good hand hygiene, cover mouth when coughing. F/u prn. If starting to experience fevers, shaking, or shortness of breath, seek immediate care. Pt voiced understanding and agreement to the plan.  Jilda Rocheicholas Paul La FolletteWendling, DO 06/01/17 2:54 PM

## 2017-06-07 ENCOUNTER — Encounter: Payer: Self-pay | Admitting: Family Medicine

## 2017-06-24 ENCOUNTER — Ambulatory Visit: Payer: Self-pay | Admitting: Family Medicine

## 2017-07-16 ENCOUNTER — Emergency Department (HOSPITAL_BASED_OUTPATIENT_CLINIC_OR_DEPARTMENT_OTHER)
Admission: EM | Admit: 2017-07-16 | Discharge: 2017-07-16 | Disposition: A | Discharge status: Home / Self Care | Payer: Medicaid Other | Attending: Emergency Medicine | Admitting: Emergency Medicine

## 2017-07-16 ENCOUNTER — Encounter (HOSPITAL_BASED_OUTPATIENT_CLINIC_OR_DEPARTMENT_OTHER): Payer: Self-pay | Admitting: Emergency Medicine

## 2017-07-16 DIAGNOSIS — Z3A01 Less than 8 weeks gestation of pregnancy: Secondary | ICD-10-CM | POA: Diagnosis not present

## 2017-07-16 DIAGNOSIS — Z3201 Encounter for pregnancy test, result positive: Secondary | ICD-10-CM | POA: Diagnosis not present

## 2017-07-16 DIAGNOSIS — Z79899 Other long term (current) drug therapy: Secondary | ICD-10-CM | POA: Diagnosis not present

## 2017-07-16 LAB — URINALYSIS, ROUTINE W REFLEX MICROSCOPIC
BILIRUBIN URINE: NEGATIVE
Glucose, UA: NEGATIVE mg/dL
HGB URINE DIPSTICK: NEGATIVE
Ketones, ur: NEGATIVE mg/dL
Leukocytes, UA: NEGATIVE
NITRITE: NEGATIVE
PROTEIN: NEGATIVE mg/dL
Specific Gravity, Urine: 1.01 (ref 1.005–1.030)
pH: 6 (ref 5.0–8.0)

## 2017-07-16 LAB — PREGNANCY, URINE: Preg Test, Ur: POSITIVE — AB

## 2017-07-16 NOTE — ED Provider Notes (Signed)
[facility] EMERGENCY DEPARTMENT Provider Note   CSN: [id number] Arrival date & time: 07/16/17  [id number]     History   Chief Complaint Chief Complaint  Patient presents with  . Possible Pregnancy    HPI Tammy Mejia is a 111P0 26 y.o. female presenting with request for pregnancy test.  Patient states that she took at home pregnancy test yesterday and today with 2 faint lines.  She is coming in to confirm that she is pregnant.  Last period was September 26.  She is not on birth control, and has been trying to get pregnant.  She denies symptoms at this time, including abdominal pain, cramping, or vaginal bleeding.  She reports some mild intermittent vaginal discharge over the past week without odor.  She denies other medical problems, is not taking medications daily.  She reports no complications after D&C for missed abortion performed in August. She sees Dr. Adrian BlackwaterStinson with OB/GYN.   HPI  Past Medical History:  Diagnosis Date  . Anxiety   . Back pain   . Deficiency of potassium   . Depression   . Hypokalemia   . Migraine     Patient Active Problem List   Diagnosis Date Noted  . Missed ab 05/09/2017  . Anxiety and depression 03/16/2016  . Migraine 08/23/2014  . Insomnia 06/20/2014  . Dysmenorrhea 06/20/2014  . Palpitations 06/29/2013    Past Surgical History:  Procedure Laterality Date  . DILATION AND EVACUATION N/A 05/13/2017   Procedure: DILATATION AND EVACUATION;  Surgeon: Reva BoresPratt, Tanya S, MD;  Location: [facility];  Service: Gynecology;  Laterality: N/A;    OB History    Gravida Para Term Preterm AB Living   1             SAB TAB Ectopic Multiple Live Births                   Home Medications    Prior to Admission medications   Medication Sig Start Date End Date Taking? Authorizing Provider  escitalopram (LEXAPRO) 10 MG tablet Take 1 tablet (10 mg total) by mouth daily. Patient not taking: Reported on 06/01/2017 04/22/17   Sharlene DoryWendling, Nicholas Paul, DO   ibuprofen (ADVIL,MOTRIN) 800 MG tablet Take 1 tablet (800 mg total) by mouth every 8 (eight) hours as needed for moderate pain. 05/10/17   Levie HeritageStinson, Jacob J, DO  Melatonin 1 MG CAPS Take 1 mg by mouth at bedtime.     [provider]  potassium chloride (KLOR-CON 10) 10 MEQ tablet Take 1 tablet (10 mEq total) by mouth daily. 01/10/17   Sharlene DoryWendling, Nicholas Paul, DO  Prenatal Vit-Fe Fumarate-FA (MULTIVITAMIN-PRENATAL) 27-0.8 MG TABS tablet Take 1 tablet by mouth daily at 12 noon.    [provider]    Family History Family History  Problem Relation Age of Onset  . COPD Mother   . Diabetes Father   . Cancer Neg Hx   . Hypertension Neg Hx   . Stroke Neg Hx     Social History Social History  Substance Use Topics  . Smoking status: Never Smoker  . Smokeless tobacco: Never Used  . Alcohol use No     Allergies   Patient has no known allergies.   Review of Systems Review of Systems  Gastrointestinal: Negative for abdominal pain, nausea and vomiting.  Genitourinary: Negative for pelvic pain and vaginal bleeding.     Physical Exam Updated Vital Signs BP 108/75 (BP Location: Left Arm)  Pulse 62   Temp 98.3 F (36.8 C) (Oral)   Resp 18   Ht 5\' 1"  (1.549 m)   Wt 47.2 kg (104 lb)   LMP 06/17/2017   SpO2 100%   BMI 19.65 kg/m   Physical Exam  Constitutional: She is oriented to person, place, and time. She appears well-developed and well-nourished. No distress.  HENT:  Head: Normocephalic and atraumatic.  Eyes: EOM are normal.  Neck: Normal range of motion.  Cardiovascular: Normal rate, regular rhythm and intact distal pulses.   Pulmonary/Chest: Effort normal and breath sounds normal. No respiratory distress. She has no wheezes.  Abdominal: Soft. Normal appearance. She exhibits no distension and no mass. There is no tenderness. There is no rigidity, no rebound and no guarding.  Abdomen is soft nontender.  Nondistended.  No rigidity or guarding.    Musculoskeletal: Normal range of motion.  Neurological: She is alert and oriented to person, place, and time.  Skin: Skin is warm. No rash noted.  Psychiatric: She has a normal mood and affect.  Nursing note and vitals reviewed.    ED Treatments / Results  Labs (all labs ordered are listed, but only abnormal results are displayed) Labs Reviewed  PREGNANCY, URINE - Abnormal; Notable for the following:       Result Value   Preg Test, Ur POSITIVE (*)    All other components within normal limits  URINALYSIS, ROUTINE W REFLEX MICROSCOPIC - Abnormal; Notable for the following:    Color, Urine STRAW (*)    All other components within normal limits    EKG  EKG Interpretation None       Radiology No results found.  Procedures Procedures (including critical care time)  Medications Ordered in ED Medications - No data to display   Initial Impression / Assessment and Plan / ED Course  I have reviewed the triage vital signs and the nursing notes.  Pertinent labs & imaging results that were available during my care of the patient were reviewed by me and considered in my medical decision making (see chart for details).     Patient presenting for request for pregnancy test.  Last period September 26.  Currently asymptomatic, no cramping, vaginal bleeding, or abdominal pain.  Physical exam without concerning signs. Will send urine for pregnancy.  Urine shows no sign of UTI and is positive for pregnancy. Discussed findings with pt. Discussed f/u with Ob/GYN. As pt is asymptomatic, will not perform [place].  Discussed prenatal care.  At this time, pt appears safe for discharge. Return precautions given. Pt states she understands and agrees to plan.   Final Clinical Impressions(s) / ED Diagnoses   Final diagnoses:  Positive pregnancy test  Less than [redacted] weeks gestation of pregnancy    New Prescriptions Discharge Medication List as of 07/16/2017 10:42 AM       Alveria Apley,  PA-C 07/16/17 1054    Jacalyn Lefevre, MD 07/16/17 1057

## 2017-07-16 NOTE — Discharge Instructions (Signed)
Take prenatal vitamins.  Avoid things that are dangerous to the fetus, including smoking, alcohol and drugs.  Follow up with your OB/GYN for management of your pregnancy. Return to the ER or the women's hospital if you develop severe abdominal cramping, vaginal bleeding, or any other new or concerning symptoms.

## 2017-07-16 NOTE — ED Triage Notes (Signed)
Had 2 positive home preg test with "faint" lines. Denies abd pain

## 2017-07-16 NOTE — ED Notes (Signed)
ED Provider at bedside. 

## 2017-07-18 ENCOUNTER — Encounter: Payer: Self-pay | Admitting: Family Medicine

## 2017-07-23 ENCOUNTER — Emergency Department (HOSPITAL_BASED_OUTPATIENT_CLINIC_OR_DEPARTMENT_OTHER)
Admission: EM | Admit: 2017-07-23 | Discharge: 2017-07-23 | Disposition: A | Discharge status: Home / Self Care | Payer: Medicaid Other | Attending: Physician Assistant | Admitting: Physician Assistant

## 2017-07-23 ENCOUNTER — Emergency Department (HOSPITAL_BASED_OUTPATIENT_CLINIC_OR_DEPARTMENT_OTHER): Payer: Medicaid Other

## 2017-07-23 ENCOUNTER — Encounter (HOSPITAL_BASED_OUTPATIENT_CLINIC_OR_DEPARTMENT_OTHER): Payer: Self-pay | Admitting: *Deleted

## 2017-07-23 DIAGNOSIS — O26891 Other specified pregnancy related conditions, first trimester: Secondary | ICD-10-CM | POA: Insufficient documentation

## 2017-07-23 DIAGNOSIS — N939 Abnormal uterine and vaginal bleeding, unspecified: Secondary | ICD-10-CM

## 2017-07-23 DIAGNOSIS — O208 Other hemorrhage in early pregnancy: Secondary | ICD-10-CM | POA: Insufficient documentation

## 2017-07-23 DIAGNOSIS — Z3A01 Less than 8 weeks gestation of pregnancy: Secondary | ICD-10-CM | POA: Insufficient documentation

## 2017-07-23 DIAGNOSIS — O4691 Antepartum hemorrhage, unspecified, first trimester: Secondary | ICD-10-CM | POA: Diagnosis present

## 2017-07-23 DIAGNOSIS — O209 Hemorrhage in early pregnancy, unspecified: Secondary | ICD-10-CM

## 2017-07-23 LAB — WET PREP, GENITAL
Clue Cells Wet Prep HPF POC: NONE SEEN
Sperm: NONE SEEN
Trich, Wet Prep: NONE SEEN
Yeast Wet Prep HPF POC: NONE SEEN

## 2017-07-23 LAB — HCG, QUANTITATIVE, PREGNANCY: HCG, BETA CHAIN, QUANT, S: 18 m[IU]/mL — AB (ref <5)

## 2017-07-23 NOTE — ED Provider Notes (Signed)
[facility] EMERGENCY DEPARTMENT Provider Note   CSN: [id number] Arrival date & time: 07/23/17  1338     History   Chief Complaint Chief Complaint  Patient presents with  . Vaginal Bleeding    HPI Tammy Mejia is a 26 y.o. female.  HPI  26 y.o. female G1P0 presents to the Emergency Department today due to vaginal bleeding since 10am. Notes spotting on tissue. No products of conception. Noted positive pregnancy test on 07-16-17 in ED. LMP September 26th. Pt worried this may be a miscarriage. Pt states she has a hx same. Believes she is between 4-[redacted] weeks pregnant. Denies abdominal pain other that occasional cramping. No N/V. No diarrhea. Notes regular BMs. No dysuria. No vaginal discharge. No other symptoms noted.    Past Medical History:  Diagnosis Date  . Anxiety   . Back pain   . Deficiency of potassium   . Depression   . Hypokalemia   . Migraine     Patient Active Problem List   Diagnosis Date Noted  . Missed ab 05/09/2017  . Anxiety and depression 03/16/2016  . Migraine 08/23/2014  . Insomnia 06/20/2014  . Dysmenorrhea 06/20/2014  . Palpitations 06/29/2013    Past Surgical History:  Procedure Laterality Date  . DILATION AND EVACUATION N/A 05/13/2017   Procedure: DILATATION AND EVACUATION;  Surgeon: Reva Bores, MD;  Location: [facility];  Service: Gynecology;  Laterality: N/A;    OB History    Gravida Para Term Preterm AB Living   1             SAB TAB Ectopic Multiple Live Births         1         Home Medications    Prior to Admission medications   Not on File    Family History Family History  Problem Relation Age of Onset  . COPD Mother   . Diabetes Father   . Cancer Neg Hx   . Hypertension Neg Hx   . Stroke Neg Hx     Social History Social History  Substance Use Topics  . Smoking status: Never Smoker  . Smokeless tobacco: Never Used  . Alcohol use No     Allergies   Patient has no known allergies.   Review  of Systems Review of Systems ROS reviewed and all are negative for acute change except as noted in the HPI.  Physical Exam Updated Vital Signs BP (!) 107/59 (BP Location: Left Arm)   Pulse 74   Temp 98.2 F (36.8 C) (Oral)   Resp 18   Ht 5\' 1"  (1.549 m)   Wt 45.4 kg (100 lb)   LMP  (LMP Unknown)   SpO2 100%   BMI 18.89 kg/m   Physical Exam  Constitutional: She is oriented to person, place, and time. Vital signs are normal. She appears well-developed and well-nourished.  HENT:  Head: Normocephalic and atraumatic.  Right Ear: Hearing normal.  Left Ear: Hearing normal.  Eyes: Pupils are equal, round, and reactive to light. Conjunctivae and EOM are normal.  Neck: Normal range of motion. Neck supple.  Cardiovascular: Normal rate, regular rhythm, normal heart sounds and intact distal pulses.   Pulmonary/Chest: Effort normal and breath sounds normal.  Abdominal: Soft. Normal appearance and bowel sounds are normal. There is no rigidity, no rebound, no guarding, no CVA tenderness, no tenderness at McBurney's point and negative Murphy's sign.  Musculoskeletal: Normal range of motion.  Neurological: She is alert  and oriented to person, place, and time.  Skin: Skin is warm and dry.  Psychiatric: She has a normal mood and affect. Her speech is normal and behavior is normal. Thought content normal.  Nursing note and vitals reviewed.  Exam performed by Eston Estersyler M Ruble Pumphrey,  exam chaperoned Date: 07/23/2017 Pelvic exam: normal external genitalia without evidence of trauma. VULVA: normal appearing vulva with no masses, tenderness or lesion. VAGINA: normal appearing vagina with normal color and discharge, no lesions. CERVIX: normal appearing cervix without lesions, cervical motion tenderness absent, cervical os closed with out purulent discharge; vaginal discharge - bloody, Wet prep and DNA probe for chlamydia and GC obtained.   ADNEXA: normal adnexa in size, nontender and no masses UTERUS: uterus  is normal size, shape, consistency and nontender.   ED Treatments / Results  Labs (all labs ordered are listed, but only abnormal results are displayed) Labs Reviewed  WET PREP, GENITAL - Abnormal; Notable for the following:       Result Value   WBC, Wet Prep HPF POC MANY (*)    All other components within normal limits  HCG, QUANTITATIVE, PREGNANCY - Abnormal; Notable for the following:    hCG, Beta Chain, Quant, S 18 (*)    All other components within normal limits  HIV ANTIBODY (ROUTINE TESTING)  RPR  ABO/RH  GC/CHLAMYDIA PROBE AMP ([place]    EKG  EKG Interpretation None       Radiology [place] Ob Comp < 14 Wks  Result Date: 07/23/2017 CLINICAL DATA:  Pregnant patient with vaginal discharge. EXAM: OBSTETRIC <14 WK US AND TRANSVAGINAL OB US TECHNIQUE: Both transabdominal and transvaginal ultrasound examinations were performed for complete evaluation of the gestation as well as the maternal uterus, adnexal regions, and pelvic cul-de-sac. Transvaginal technique was performed to assess early pregnancy. COMPARISON:  Pelvic ultrasound 05/17/2017 FINDINGS: Intrauterine gestational sac: None Yolk sac:  Not Visualized. Embryo:  Not Visualized. Cardiac Activity: Not Visualized. Maternal uterus/adnexae: Normal right and left ovaries. Trace free fluid in the pelvis. IMPRESSION: No intrauterine gestation identified. In the setting of positive pregnancy test and no definite intrauterine pregnancy, this reflects a pregnancy of unknown location. Differential considerations include early normal IUP, abnormal IUP, or nonvisualized ectopic pregnancy. Differentiation is achieved with serial beta HCG supplemented by repeat sonography as clinically warranted. Electronically Signed   By: Annia Beltrew  Davis M.D.   On: 07/23/2017 15:42   [place] Ob Transvaginal  Result Date: 07/23/2017 CLINICAL DATA:  Pregnant patient with vaginal discharge. EXAM: OBSTETRIC <14 WK US AND TRANSVAGINAL OB US TECHNIQUE:  Both transabdominal and transvaginal ultrasound examinations were performed for complete evaluation of the gestation as well as the maternal uterus, adnexal regions, and pelvic cul-de-sac. Transvaginal technique was performed to assess early pregnancy. COMPARISON:  Pelvic ultrasound 05/17/2017 FINDINGS: Intrauterine gestational sac: None Yolk sac:  Not Visualized. Embryo:  Not Visualized. Cardiac Activity: Not Visualized. Maternal uterus/adnexae: Normal right and left ovaries. Trace free fluid in the pelvis. IMPRESSION: No intrauterine gestation identified. In the setting of positive pregnancy test and no definite intrauterine pregnancy, this reflects a pregnancy of unknown location. Differential considerations include early normal IUP, abnormal IUP, or nonvisualized ectopic pregnancy. Differentiation is achieved with serial beta HCG supplemented by repeat sonography as clinically warranted. Electronically Signed   By: Annia Beltrew  Davis M.D.   On: 07/23/2017 15:42    Procedures Procedures (including critical care time)  Medications Ordered in ED Medications - No data to display   Initial Impression /  Assessment and Plan / ED Course  I have reviewed the triage vital signs and the nursing notes.  Pertinent labs & imaging results that were available during my care of the patient were reviewed by me and considered in my medical decision making (see chart for details).  Final Clinical Impressions(s) / ED Diagnoses  {I have reviewed and evaluated the relevant laboratory values. {I have reviewed and evaluated the relevant imaging studies.  {I have reviewed the relevant previous healthcare records.  {I obtained HPI from historian. {Patient discussed with supervising physician.  ED Course:  Assessment: Pt is a 26 y.o. female G1P0 presents to the Emergency Department today due to vaginal bleeding since 10am. Notes spotting on tissue. No products of conception. Noted positive pregnancy test on 07-16-17 in ED. LMP  September 26th. Pt worried this may be a miscarriage. Pt states she has a hx same. Believes she is between 4-[redacted] weeks pregnant. Denies abdominal pain other that occasional cramping. No N/V. No diarrhea. Notes regular BMs. No dysuria. No vaginal discharge. On exam, pt in NAD. Nontoxic/nonseptic appearing. VSS. Afebrile. Lungs CTA. Heart RRR. Abdomen nontender soft. GU Exam with blood noted. No adnexal or CMT. Cervical Os open. No products of conception. UA unremarkable. Rh send out result. Pt can follow up with OBGYN. Pelvis US without IUP. Doubt Ectopic. HCG Quant 18. Plan is to DC home with close follow up to OBGYN for serial HCG quant in 2-3 days. Strict return precautions given. Suspect likely miscarriage. At time of discharge, Patient is in no acute distress. Vital Signs are stable. Patient is able to ambulate. Patient able to tolerate PO.   Disposition/Plan:  DC Home Additional Verbal discharge instructions given and discussed with patient.  Pt Instructed to f/u with OBGYN in the next week for evaluation and treatment of symptoms. Return precautions given Pt acknowledges and agrees with plan  Supervising Physician Mackuen, Courteney Lyn, *  Final diagnoses:  Vaginal bleeding  First trimester bleeding    New Prescriptions New Prescriptions   No medications on file     Audry Pili, Cordelia Poche 07/23/17 1616    Mackuen, Cindee Salt, MD 07/24/17 [phone]

## 2017-07-23 NOTE — ED Notes (Signed)
Lab called and reported that pt was O+ and that she had been tested on 05-13-2017. Dr. Denton LankSteinl aware.

## 2017-07-23 NOTE — Discharge Instructions (Addendum)
Please read and follow all provided instructions.  Your diagnoses today include:  1. First trimester bleeding   2. Vaginal bleeding     Tests performed today include: Vital signs. See below for your results today.   Medications prescribed:  Take as prescribed   Home care instructions:  Follow any educational materials contained in this packet.  Follow-up instructions: Please follow-up with OBGYN for further evaluation of symptoms and treatment in 2-3 days for a repeat HCG Quant  Return instructions:  Please return to the Emergency Department if you do not get better, if you get worse, or new symptoms OR  - Fever (temperature greater than 101.81F)  - Bleeding that does not stop with holding pressure to the area    -Severe pain (please note that you may be more sore the day after your accident)  - Chest Pain  - Difficulty breathing  - Severe nausea or vomiting  - Inability to tolerate food and liquids  - Passing out  - Skin becoming red around your wounds  - Change in mental status (confusion or lethargy)  - New numbness or weakness    Please return if you have any other emergent concerns.  Additional Information:  Your vital signs today were: BP (!) 107/59 (BP Location: Left Arm)    Pulse 74    Temp 98.2 F (36.8 C) (Oral)    Resp 18    Ht 5\' 1"  (1.549 m)    Wt 45.4 kg (100 lb)    LMP 06/17/2017    SpO2 100%    BMI 18.89 kg/m  If your blood pressure (BP) was elevated above 135/85 this visit, please have this repeated by your doctor within one month. ---------------

## 2017-07-23 NOTE — ED Triage Notes (Signed)
Light vaginal bleeding since 10am. Pt 4-[redacted] weeks pregnant.

## 2017-07-23 NOTE — ED Notes (Signed)
Patient transported to ultrasound.

## 2017-07-24 LAB — HIV ANTIBODY (ROUTINE TESTING W REFLEX): HIV SCREEN 4TH GENERATION: NONREACTIVE

## 2017-07-24 LAB — RPR: RPR: NONREACTIVE

## 2017-07-25 LAB — GC/CHLAMYDIA PROBE AMP (~~LOC~~) NOT AT ARMC
CHLAMYDIA, DNA PROBE: NEGATIVE
Neisseria Gonorrhea: NEGATIVE

## 2017-07-29 ENCOUNTER — Ambulatory Visit: Payer: Self-pay | Admitting: Family Medicine

## 2017-08-15 ENCOUNTER — Encounter: Payer: Self-pay | Admitting: Family Medicine

## 2017-08-18 ENCOUNTER — Encounter: Payer: Self-pay | Admitting: Family Medicine

## 2017-09-07 ENCOUNTER — Encounter: Payer: Self-pay | Admitting: Obstetrics & Gynecology

## 2017-11-21 ENCOUNTER — Emergency Department (HOSPITAL_BASED_OUTPATIENT_CLINIC_OR_DEPARTMENT_OTHER)
Admission: EM | Admit: 2017-11-21 | Discharge: 2017-11-21 | Disposition: A | Discharge status: Home / Self Care | Payer: Medicaid Other | Attending: Emergency Medicine | Admitting: Emergency Medicine

## 2017-11-21 ENCOUNTER — Other Ambulatory Visit: Payer: Self-pay

## 2017-11-21 ENCOUNTER — Encounter (HOSPITAL_BASED_OUTPATIENT_CLINIC_OR_DEPARTMENT_OTHER): Payer: Self-pay | Admitting: Emergency Medicine

## 2017-11-21 ENCOUNTER — Emergency Department (HOSPITAL_BASED_OUTPATIENT_CLINIC_OR_DEPARTMENT_OTHER): Payer: Medicaid Other

## 2017-11-21 DIAGNOSIS — O9989 Other specified diseases and conditions complicating pregnancy, childbirth and the puerperium: Secondary | ICD-10-CM | POA: Diagnosis present

## 2017-11-21 DIAGNOSIS — O26899 Other specified pregnancy related conditions, unspecified trimester: Secondary | ICD-10-CM

## 2017-11-21 DIAGNOSIS — R109 Unspecified abdominal pain: Secondary | ICD-10-CM

## 2017-11-21 DIAGNOSIS — Z3A00 Weeks of gestation of pregnancy not specified: Secondary | ICD-10-CM | POA: Diagnosis not present

## 2017-11-21 DIAGNOSIS — O26891 Other specified pregnancy related conditions, first trimester: Secondary | ICD-10-CM | POA: Insufficient documentation

## 2017-11-21 DIAGNOSIS — R1030 Lower abdominal pain, unspecified: Secondary | ICD-10-CM | POA: Insufficient documentation

## 2017-11-21 LAB — COMPREHENSIVE METABOLIC PANEL
ALT: 11 U/L — AB (ref 14–54)
AST: 19 U/L (ref 15–41)
Albumin: 4 g/dL (ref 3.5–5.0)
Alkaline Phosphatase: 58 U/L (ref 38–126)
Anion gap: 6 (ref 5–15)
BILIRUBIN TOTAL: 0.4 mg/dL (ref 0.3–1.2)
BUN: 6 mg/dL (ref 6–20)
CO2: 25 mmol/L (ref 22–32)
CREATININE: 0.61 mg/dL (ref 0.44–1.00)
Calcium: 9 mg/dL (ref 8.9–10.3)
Chloride: 105 mmol/L (ref 101–111)
Glucose, Bld: 87 mg/dL (ref 65–99)
Potassium: 3.8 mmol/L (ref 3.5–5.1)
Sodium: 136 mmol/L (ref 135–145)
TOTAL PROTEIN: 7.6 g/dL (ref 6.5–8.1)

## 2017-11-21 LAB — WET PREP, GENITAL
CLUE CELLS WET PREP: NONE SEEN
Sperm: NONE SEEN
Trich, Wet Prep: NONE SEEN
YEAST WET PREP: NONE SEEN

## 2017-11-21 LAB — PREGNANCY, URINE: Preg Test, Ur: POSITIVE — AB

## 2017-11-21 LAB — CBC WITH DIFFERENTIAL/PLATELET
BASOS ABS: 0 10*3/uL (ref 0.0–0.1)
Basophils Relative: 0 %
EOS ABS: 0 10*3/uL (ref 0.0–0.7)
EOS PCT: 0 %
HCT: 35.5 % — ABNORMAL LOW (ref 36.0–46.0)
Hemoglobin: 11.6 g/dL — ABNORMAL LOW (ref 12.0–15.0)
Lymphocytes Relative: 16 %
Lymphs Abs: 1.5 10*3/uL (ref 0.7–4.0)
MCH: 28.3 pg (ref 26.0–34.0)
MCHC: 32.7 g/dL (ref 30.0–36.0)
MCV: 86.6 fL (ref 78.0–100.0)
Monocytes Absolute: 1 10*3/uL (ref 0.1–1.0)
Monocytes Relative: 11 %
Neutro Abs: 6.9 10*3/uL (ref 1.7–7.7)
Neutrophils Relative %: 73 %
PLATELETS: 305 10*3/uL (ref 150–400)
RBC: 4.1 MIL/uL (ref 3.87–5.11)
RDW: 12.3 % (ref 11.5–15.5)
WBC: 9.5 10*3/uL (ref 4.0–10.5)

## 2017-11-21 LAB — URINALYSIS, ROUTINE W REFLEX MICROSCOPIC
BILIRUBIN URINE: NEGATIVE
GLUCOSE, UA: NEGATIVE mg/dL
HGB URINE DIPSTICK: NEGATIVE
Ketones, ur: NEGATIVE mg/dL
Leukocytes, UA: NEGATIVE
Nitrite: NEGATIVE
Protein, ur: NEGATIVE mg/dL
SPECIFIC GRAVITY, URINE: 1.02 (ref 1.005–1.030)
pH: 6.5 (ref 5.0–8.0)

## 2017-11-21 LAB — HCG, QUANTITATIVE, PREGNANCY: HCG, BETA CHAIN, QUANT, S: 7306 m[IU]/mL — AB (ref <5)

## 2017-11-21 NOTE — ED Provider Notes (Signed)
Emergency Department Provider Note   I have reviewed the triage vital signs and the nursing notes.   HISTORY  Chief Complaint Abdominal Cramping   HPI Tammy Mejia is a 27 y.o. female G3P0 presents to the ED for evaluation of lower abdominal cramping pain worsening over the past 1-2 days with positive home pregnancy test.  The patient has had 2 prior miscarriages early in pregnancy and was concerned about the possibility of a third miscarriage.  She denies any vaginal bleeding or discharge.  She describes the pain is diffuse and cramping in quality but states it is worsening.  No shortness of breath or lightheadedness.  Denies any dysuria, hesitancy, urgency.  No abdominal injury.  The patient's last menstrual period was January 21st. Denies any fever/chills.    Past Medical History:  Diagnosis Date  . Anxiety   . Back pain   . Deficiency of potassium   . Depression   . Hypokalemia   . Migraine     Patient Active Problem List   Diagnosis Date Noted  . Missed ab 05/09/2017  . Anxiety and depression 03/16/2016  . Migraine 08/23/2014  . Insomnia 06/20/2014  . Dysmenorrhea 06/20/2014  . Palpitations 06/29/2013    Past Surgical History:  Procedure Laterality Date  . DILATION AND EVACUATION N/A 05/13/2017   Procedure: DILATATION AND EVACUATION;  Surgeon: Reva Bores, MD;  Location: [facility];  Service: Gynecology;  Laterality: N/A;      Allergies Patient has no known allergies.  Family History  Problem Relation Age of Onset  . COPD Mother   . Diabetes Father   . Cancer Neg Hx   . Hypertension Neg Hx   . Stroke Neg Hx     Social History Social History   Tobacco Use  . Smoking status: Never Smoker  . Smokeless tobacco: Never Used  Substance Use Topics  . Alcohol use: No  . Drug use: No    Review of Systems  Constitutional: No fever/chills Eyes: No visual changes. ENT: No sore throat. Cardiovascular: Denies chest pain. Respiratory: Denies  shortness of breath. Gastrointestinal: No abdominal pain.  No nausea, no vomiting.  No diarrhea.  No constipation. Genitourinary: Negative for dysuria. Cramping lower abdominal pain.  Musculoskeletal: Negative for back pain. Skin: Negative for rash. Neurological: Negative for headaches, focal weakness or numbness.  10-point ROS otherwise negative.  ____________________________________________   PHYSICAL EXAM:  VITAL SIGNS: ED Triage Vitals  Enc Vitals Group     BP 11/21/17 1227 120/75     Pulse Rate 11/21/17 1227 80     Resp 11/21/17 1227 18     Temp 11/21/17 1227 98.3 F (36.8 C)     Temp Source 11/21/17 1227 Oral     SpO2 11/21/17 1227 100 %     Weight 11/21/17 1226 105 lb (47.6 kg)     Height 11/21/17 1226 5\' 1"  (1.549 m)     Pain Score 11/21/17 1225 3   Constitutional: Alert and oriented. Well appearing and in no acute distress. Eyes: Conjunctivae are normal.  Head: Atraumatic. Nose: No congestion/rhinnorhea. Mouth/Throat: Mucous membranes are moist.  Neck: No stridor.   Cardiovascular: Normal rate, regular rhythm. Good peripheral circulation. Grossly normal heart sounds.   Respiratory: Normal respiratory effort.  No retractions. Lungs CTAB. Gastrointestinal: Soft and nontender. No distention.  Genitourinary: Pelvic exam performed with a nurse chaperone. Normal external genitalia. No appreciable vaginal bleeding. Cervix is visually closed with mild white discharge. Bimanual exam deferred with early  pregnancy.  Musculoskeletal: No lower extremity tenderness nor edema. No gross deformities of extremities. Neurologic:  Normal speech and language. No gross focal neurologic deficits are appreciated.  Skin:  Skin is warm, dry and intact. No rash noted.  ____________________________________________   LABS (all labs ordered are listed, but only abnormal results are displayed)  Labs Reviewed  WET PREP, GENITAL - Abnormal; Notable for the following components:      Result  Value   WBC, Wet Prep HPF POC MANY (*)    All other components within normal limits  PREGNANCY, URINE - Abnormal; Notable for the following components:   Preg Test, Ur POSITIVE (*)    All other components within normal limits  COMPREHENSIVE METABOLIC PANEL - Abnormal; Notable for the following components:   ALT 11 (*)    All other components within normal limits  CBC WITH DIFFERENTIAL/PLATELET - Abnormal; Notable for the following components:   Hemoglobin 11.6 (*)    HCT 35.5 (*)    All other components within normal limits  HCG, QUANTITATIVE, PREGNANCY - Abnormal; Notable for the following components:   hCG, Beta Chain, Quant, S 7,306 (*)    All other components within normal limits  URINALYSIS, ROUTINE W REFLEX MICROSCOPIC  GC/CHLAMYDIA PROBE AMP ([place]) NOT AT [facility]   ____________________________________________  RADIOLOGY  TVUS pending.  ____________________________________________   PROCEDURES  Procedure(s) performed:   Procedures  None  ____________________________________________   INITIAL IMPRESSION / ASSESSMENT AND PLAN / ED COURSE  Pertinent labs & imaging results that were available during my care of the patient were reviewed by me and considered in my medical decision making (see chart for details).  Patient presents to the emergency department for evaluation of cramping lower abdominal pain in the setting of early pregnancy.  No vaginal bleeding on exam.  Cervix is visually closed.  Pregnancy confirmed with urine pregnancy test.  No asymptomatic bacteriuria on UA.  Labs obtained and reviewed which showed normal hemoglobin and otherwise no acute ab normalities.  Patient's beta hCG is greater than 7000. Plan for TVUS to confirm IUP and evaluate the adnexa. Patient has an appointment scheduled with Dr. Adrian BlackwaterStinson in mid March.   ____________________________________________  FINAL CLINICAL IMPRESSION(S) / ED DIAGNOSES  Final diagnoses:  Abdominal pain  during pregnancy in first trimester    Note:  This document was prepared using Dragon voice recognition software and may include unintentional dictation errors.  Alona BeneJoshua Long, MD Emergency Medicine    Long, Arlyss RepressJoshua G, MD 11/21/17 [id number]

## 2017-11-21 NOTE — ED Triage Notes (Signed)
Reports positive home pregnancy tests this week.  Reports she began having lower abdominal pain and cramping yesterday.  States she had 2 previous miscarriages and "want to make sure everything is okay".

## 2017-11-21 NOTE — Discharge Instructions (Signed)
1.  Follow-up with your obstetrician this week.

## 2017-11-21 NOTE — ED Notes (Signed)
ED Provider at bedside. 

## 2017-11-22 LAB — GC/CHLAMYDIA PROBE AMP (~~LOC~~) NOT AT ARMC
Chlamydia: NEGATIVE
Neisseria Gonorrhea: NEGATIVE

## 2017-12-05 ENCOUNTER — Other Ambulatory Visit (HOSPITAL_COMMUNITY)
Admission: RE | Admit: 2017-12-05 | Discharge: 2017-12-05 | Disposition: A | Discharge status: Home / Self Care | Payer: Medicaid Other | Source: Ambulatory Visit | Attending: Family Medicine | Admitting: Family Medicine

## 2017-12-05 ENCOUNTER — Encounter: Payer: Self-pay | Admitting: Family Medicine

## 2017-12-05 ENCOUNTER — Ambulatory Visit (INDEPENDENT_AMBULATORY_CARE_PROVIDER_SITE_OTHER): Payer: Medicaid Other | Admitting: Family Medicine

## 2017-12-05 VITALS — BP 111/58 | HR 73 | Wt 107.0 lb

## 2017-12-05 DIAGNOSIS — Z3687 Encounter for antenatal screening for uncertain dates: Secondary | ICD-10-CM | POA: Diagnosis not present

## 2017-12-05 DIAGNOSIS — Z283 Underimmunization status: Secondary | ICD-10-CM

## 2017-12-05 DIAGNOSIS — Z3400 Encounter for supervision of normal first pregnancy, unspecified trimester: Secondary | ICD-10-CM | POA: Insufficient documentation

## 2017-12-05 DIAGNOSIS — Z3490 Encounter for supervision of normal pregnancy, unspecified, unspecified trimester: Secondary | ICD-10-CM | POA: Diagnosis present

## 2017-12-05 DIAGNOSIS — Z3491 Encounter for supervision of normal pregnancy, unspecified, first trimester: Secondary | ICD-10-CM

## 2017-12-05 DIAGNOSIS — Z2839 Other underimmunization status: Secondary | ICD-10-CM

## 2017-12-05 DIAGNOSIS — O9989 Other specified diseases and conditions complicating pregnancy, childbirth and the puerperium: Secondary | ICD-10-CM

## 2017-12-05 NOTE — Progress Notes (Signed)
DATING AND VIABILITY SONOGRAM   Tammy Mejia is a 27 y.o. year old G3P0020 with LMP Patient's last menstrual period was 10/10/2017 (exact date). which would correlate to  4274w0d weeks gestation.  She has regular menstrual cycles.   She is here today for a confirmatory initial sonogram.    GESTATION: SINGLETON     FETAL ACTIVITY:          Heart rate         146          The fetus is active.     ADNEXA: The ovaries are normal.   GESTATIONAL AGE AND  BIOMETRICS:  Gestational criteria: Estimated Date of Delivery: 07/24/18 by early ultrasound now at 3674w0d  Previous Scans:1  GESTATIONAL SAC         0.943 cm        7-0weeks  CROWN RUMP LENGTH          1.81cm         6-5weeks                                                                               AVERAGE EGA(BY THIS SCAN):  7-0weeks  WORKING EDD( early ultrasound ):  07-24-18      Armandina StammerJennifer Howard 12/05/2017 10:22 AM

## 2017-12-05 NOTE — Progress Notes (Signed)
Subjective:  Tammy Mejia is a [id number] [redacted]w[redacted]d being seen today for her first obstetrical visit.  Her obstetrical history is significant for 2 prior miscarriages. Patient not sure about breast feeding. Pregnancy history fully reviewed.  Patient reports nausea.  BP (!) 111/58   Pulse 73   Wt 107 lb (48.5 kg)   LMP 10/10/2017 (Exact Date)   Breastfeeding? Unknown   BMI 20.22 kg/m   HISTORY: OB History  Gravida Para Term Preterm AB Living  3       2    SAB TAB Ectopic Multiple Live Births  2     0      # Outcome Date GA Lbr Len/2nd Weight Sex Delivery Anes PTL Lv  3 Current           2 SAB           1 SAB               Past Medical History:  Diagnosis Date  . Anxiety   . Back pain   . Deficiency of potassium   . Depression   . Hypokalemia   . Migraine     Past Surgical History:  Procedure Laterality Date  . DILATION AND EVACUATION N/A 05/13/2017   Procedure: DILATATION AND EVACUATION;  Surgeon: Reva Bores, MD;  Location: [facility];  Service: Gynecology;  Laterality: N/A;    Family History  Problem Relation Age of Onset  . COPD Mother   . Diabetes Father   . Cancer Neg Hx   . Hypertension Neg Hx   . Stroke Neg Hx      Exam    Uterus:     Pelvic Exam:    Perineum: No Hemorrhoids, Normal Perineum   Vulva: normal, Bartholin's, Urethra, Skene's normal   Vagina:  normal mucosa   Cervix: no bleeding following Pap, no cervical motion tenderness, no lesions and nulliparous appearance   Adnexa: normal adnexa and no mass, fullness, tenderness   Bony Pelvis: gynecoid  System: Breast:  Patient declines exam   Skin: normal coloration and turgor, no rashes    Neurologic: gait normal; reflexes normal and symmetric   Extremities: normal strength, tone, and muscle mass, no deformities, no erythema, induration, or nodules, no evidence of joint effusion, ROM of all joints is normal   HEENT PERRLA and extra ocular movement intact   Mouth/Teeth mucous membranes  moist, pharynx normal without lesions   Neck supple and no masses   Cardiovascular: regular rate and rhythm, no murmurs or gallops   Respiratory:  appears well, vitals normal, no respiratory distress, acyanotic, normal RR, ear and throat exam is normal, neck free of mass or lymphadenopathy, chest clear, no wheezing, crepitations, rhonchi, normal symmetric air entry   Abdomen: soft, non-tender; bowel sounds normal; no masses,  no organomegaly   Urinary: urethral meatus normal      Assessment:    Pregnancy: [id number] Patient Active Problem List   Diagnosis Date Noted  . Anxiety and depression 03/16/2016  . Migraine 08/23/2014  . Insomnia 06/20/2014  . Dysmenorrhea 06/20/2014  . Palpitations 06/29/2013      Plan:   1. Prenatal care, antepartum Genetic Screening discussed Quad Screen: requested.  Ultrasound discussed; fetal survey: requested.  Follow up in 4 weeks.  - Cytology - PAP - Obstetric Panel, Including HIV - CULTURE, URINE COMPREHENSIVE - Cystic Fibrosis Mutation 97   Problem list reviewed and updated. 75% of 45 min visit spent on counseling and coordination of  care.     Rhona RaiderJacob J [facility] [place] 12/05/2017

## 2017-12-06 DIAGNOSIS — O9989 Other specified diseases and conditions complicating pregnancy, childbirth and the puerperium: Secondary | ICD-10-CM

## 2017-12-06 DIAGNOSIS — Z283 Underimmunization status: Secondary | ICD-10-CM | POA: Insufficient documentation

## 2017-12-06 DIAGNOSIS — O09899 Supervision of other high risk pregnancies, unspecified trimester: Secondary | ICD-10-CM | POA: Insufficient documentation

## 2017-12-06 LAB — OBSTETRIC PANEL, INCLUDING HIV
Antibody Screen: NEGATIVE
Basophils Absolute: 0 10*3/uL (ref 0.0–0.2)
Basos: 0 %
EOS (ABSOLUTE): 0.1 10*3/uL (ref 0.0–0.4)
EOS: 1 %
HEMOGLOBIN: 11.6 g/dL (ref 11.1–15.9)
HIV Screen 4th Generation wRfx: NONREACTIVE
Hematocrit: 36.7 % (ref 34.0–46.6)
Hepatitis B Surface Ag: NEGATIVE
IMMATURE GRANS (ABS): 0 10*3/uL (ref 0.0–0.1)
IMMATURE GRANULOCYTES: 0 %
LYMPHS: 15 %
Lymphocytes Absolute: 1.4 10*3/uL (ref 0.7–3.1)
MCH: 26.9 pg (ref 26.6–33.0)
MCHC: 31.6 g/dL (ref 31.5–35.7)
MCV: 85 fL (ref 79–97)
Monocytes Absolute: 1 10*3/uL — ABNORMAL HIGH (ref 0.1–0.9)
Monocytes: 11 %
NEUTROS PCT: 73 %
Neutrophils Absolute: 7 10*3/uL (ref 1.4–7.0)
PLATELETS: 337 10*3/uL (ref 150–379)
RBC: 4.31 x10E6/uL (ref 3.77–5.28)
RDW: 13 % (ref 12.3–15.4)
RH TYPE: POSITIVE
RPR Ser Ql: NONREACTIVE
Rubella Antibodies, IGG: <0.9 index — ABNORMAL LOW (ref >0.99)
WBC: 9.6 10*3/uL (ref 3.4–10.8)

## 2017-12-07 LAB — CULTURE, URINE COMPREHENSIVE

## 2017-12-09 LAB — CYTOLOGY - PAP
Chlamydia: NEGATIVE
DIAGNOSIS: UNDETERMINED — AB
HPV: DETECTED — AB
NEISSERIA GONORRHEA: NEGATIVE

## 2017-12-10 LAB — CYSTIC FIBROSIS MUTATION 97: GENE DIS ANAL CARRIER INTERP BLD/T-IMP: NOT DETECTED

## 2017-12-12 ENCOUNTER — Encounter: Payer: Self-pay | Admitting: Family Medicine

## 2017-12-12 DIAGNOSIS — R87810 Cervical high risk human papillomavirus (HPV) DNA test positive: Secondary | ICD-10-CM

## 2017-12-12 DIAGNOSIS — R87610 Atypical squamous cells of undetermined significance on cytologic smear of cervix (ASC-US): Secondary | ICD-10-CM

## 2017-12-12 HISTORY — DX: Atypical squamous cells of undetermined significance on cytologic smear of cervix (ASC-US): R87.610

## 2017-12-14 ENCOUNTER — Encounter: Payer: Self-pay | Admitting: Family Medicine

## 2017-12-14 MED ORDER — PROMETHAZINE HCL 25 MG PO TABS: 25.0000 mg | ORAL_TABLET | Freq: Four times a day (QID) | ORAL | 2 refills | Status: DC | PRN

## 2018-01-02 ENCOUNTER — Ambulatory Visit (INDEPENDENT_AMBULATORY_CARE_PROVIDER_SITE_OTHER): Payer: Medicaid Other | Admitting: Obstetrics and Gynecology

## 2018-01-02 ENCOUNTER — Encounter: Payer: Self-pay | Admitting: Obstetrics and Gynecology

## 2018-01-02 VITALS — BP 109/67 | HR 79 | Wt 107.0 lb

## 2018-01-02 DIAGNOSIS — Z3481 Encounter for supervision of other normal pregnancy, first trimester: Secondary | ICD-10-CM

## 2018-01-02 DIAGNOSIS — Z3480 Encounter for supervision of other normal pregnancy, unspecified trimester: Secondary | ICD-10-CM

## 2018-01-02 DIAGNOSIS — Z3400 Encounter for supervision of normal first pregnancy, unspecified trimester: Secondary | ICD-10-CM

## 2018-01-02 DIAGNOSIS — R87810 Cervical high risk human papillomavirus (HPV) DNA test positive: Secondary | ICD-10-CM | POA: Diagnosis not present

## 2018-01-02 DIAGNOSIS — Z283 Underimmunization status: Secondary | ICD-10-CM

## 2018-01-02 DIAGNOSIS — O9989 Other specified diseases and conditions complicating pregnancy, childbirth and the puerperium: Secondary | ICD-10-CM

## 2018-01-02 DIAGNOSIS — R87610 Atypical squamous cells of undetermined significance on cytologic smear of cervix (ASC-US): Secondary | ICD-10-CM | POA: Diagnosis not present

## 2018-01-02 DIAGNOSIS — O09899 Supervision of other high risk pregnancies, unspecified trimester: Secondary | ICD-10-CM

## 2018-01-02 NOTE — Progress Notes (Signed)
Colposcopy Procedure Note  Pre-operative Diagnosis: ASCUS, positive high risk HPV  Post-operative Diagnosis: low grade  Indications:  ASCUS, positive hr HPV  Procedure Details  LMP n/a; UPT pregnant.    The risks (including infection, bleeding, pain) and benefits of the procedure were explained to the patient and written informed consent was obtained.  The patient was placed in the dorsal lithotomy position. A pederson was speculum inserted in the vagina, and the cervix was visualized.  The cervix was stained with acetic acid and visualized using the colposcope under magnification as well as with a green filter. Findings as below. No biopsies taken. Small amount of bleeding with application of acetic acid that self-resovled. Patient tolerated procedure well.  Findings: cervix very friable, some bleeding noted with application of acetic acid, punctate lesion seen at 6 o'clock, acetowhite lesion at 12 o'clock and 2 o'clock  Adequate: yes  Specimens: none  Condition: Stable  Complications: None  Plan: The patient was advised to call for any fever or for prolonged or severe pain or bleeding. She was advised to use OTC analgesics as needed for mild to moderate pain. She was advised to avoid vaginal intercourse for 48 hours or until the bleeding has completely stopped.  Recommend repeat colpo post partum.   Baldemar LenisK. Meryl Mansel Strother, M.D. Attending Obstetrician & Gynecologist, [facility], [facility]

## 2018-01-02 NOTE — Progress Notes (Signed)
   PRENATAL VISIT NOTE  Subjective:  Tammy Mejia is a 27 y.o. G3P0020 at 66w0dbeing seen today for ongoing prenatal care.  She is currently monitored for the following issues for this low-risk pregnancy and has Palpitations; Insomnia; Dysmenorrhea; Migraine; Anxiety and depression; Supervision of normal first pregnancy, antepartum; Rubella non-immune status, antepartum; and ASCUS with positive high risk HPV cervical on their problem list.  Patient reports one episode of vaginal spotting after intercourse. Has cold symptoms today.  Contractions: Not present. Vag. Bleeding: None.   . Denies leaking of fluid.   The following portions of the patient's history were reviewed and updated as appropriate: allergies, current medications, past family history, past medical history, past social history, past surgical history and problem list. Problem list updated.  Objective:   Vitals:   01/02/18 1026  BP: 109/67  Pulse: 79  Weight: 107 lb (48.5 kg)    Fetal Status:           General:  Alert, oriented and cooperative. Patient is in no acute distress.  Skin: Skin is warm and dry. No rash noted.   Cardiovascular: Normal heart rate noted  Respiratory: Normal respiratory effort, no problems with respiration noted  Abdomen: Soft, gravid, appropriate for gestational age.  Pain/Pressure: Present     Pelvic: Cervical exam deferred        Extremities: Normal range of motion.  Edema: None  Mental Status: Normal mood and affect. Normal behavior. Normal judgment and thought content.   Assessment and Plan:  Pregnancy: G3P0020 at 140w0d1. Supervision of other normal pregnancy, antepartum - [place] OB DETAIL +14 WK; Future  2. ASCUS with positive high risk HPV cervical colpo today, please see note  3. Rubella non-immune status, antepartum MMR pp   Preterm labor symptoms and general obstetric precautions including but not limited to vaginal bleeding, contractions, leaking of fluid and fetal  movement were reviewed in detail with the patient. Please refer to After Visit Summary for other counseling recommendations.  Return in about 1 month (around 01/30/2018) for OB visit (MD).  No future appointments.  KeSloan LeiterMD

## 2018-01-16 ENCOUNTER — Encounter: Payer: Self-pay | Admitting: Family Medicine

## 2018-01-30 ENCOUNTER — Ambulatory Visit (INDEPENDENT_AMBULATORY_CARE_PROVIDER_SITE_OTHER): Payer: Medicaid Other | Admitting: Obstetrics & Gynecology

## 2018-01-30 VITALS — BP 96/64 | HR 76 | Wt 110.8 lb

## 2018-01-30 DIAGNOSIS — O9989 Other specified diseases and conditions complicating pregnancy, childbirth and the puerperium: Secondary | ICD-10-CM

## 2018-01-30 DIAGNOSIS — O99340 Other mental disorders complicating pregnancy, unspecified trimester: Secondary | ICD-10-CM

## 2018-01-30 DIAGNOSIS — F329 Major depressive disorder, single episode, unspecified: Secondary | ICD-10-CM | POA: Insufficient documentation

## 2018-01-30 DIAGNOSIS — Z3400 Encounter for supervision of normal first pregnancy, unspecified trimester: Secondary | ICD-10-CM

## 2018-01-30 DIAGNOSIS — F32A Depression, unspecified: Secondary | ICD-10-CM

## 2018-01-30 DIAGNOSIS — O09899 Supervision of other high risk pregnancies, unspecified trimester: Secondary | ICD-10-CM

## 2018-01-30 DIAGNOSIS — Z283 Underimmunization status: Secondary | ICD-10-CM

## 2018-01-30 MED ORDER — SERTRALINE HCL 25 MG PO TABS: 25.0000 mg | ORAL_TABLET | Freq: Every day | ORAL | 0 refills | Status: DC

## 2018-01-30 MED ORDER — SERTRALINE HCL 50 MG PO TABS: 50.0000 mg | ORAL_TABLET | Freq: Every day | ORAL | 2 refills | Status: DC

## 2018-01-30 NOTE — Progress Notes (Signed)
oloft   PRENATAL VISIT NOTE  Subjective:  Tammy Mejia is a 27 y.o. G3P0020 at 71w0dbeing seen today for ongoing prenatal care.  She is currently monitored for the following issues for this low-risk pregnancy and has Palpitations; Insomnia; Dysmenorrhea; Migraine; Anxiety and depression; Supervision of normal first pregnancy, antepartum; Rubella non-immune status, antepartum; and ASCUS with positive high risk HPV cervical on their problem list.  Patient reports depression sx. Crying herself to sleep every night. Pt reports a h/o depression. She was prev on meds     Contractions: Not present. Vag. Bleeding: None.   . Denies leaking of fluid.   The following portions of the patient's history were reviewed and updated as appropriate: allergies, current medications, past family history, past medical history, past social history, past surgical history and problem list. Problem list updated.  Objective:   Vitals:   01/30/18 0914  BP: 96/64  Pulse: 76  Weight: 110 lb 12.8 oz (50.3 kg)    Fetal Status: Fetal Heart Rate (bpm): 153         General:  Alert, oriented and cooperative. Patient is in no acute distress.  Skin: Skin is warm and dry. No rash noted.   Cardiovascular: Normal heart rate noted  Respiratory: Normal respiratory effort, no problems with respiration noted  Abdomen: Soft, gravid, appropriate for gestational age.  Pain/Pressure: Absent     Pelvic: Cervical exam deferred        Extremities: Normal range of motion.  Edema: None  Mental Status: Normal mood and affect. Normal behavior. Normal judgment and thought content.   Assessment and Plan:  Pregnancy: G3P0020 at 168w0d1. Supervision of normal first pregnancy, antepartum Genetic testing next visit Has anatomy scan ordered  2. Rubella non-immune status, antepartum Need MMR postpartum    3. Depression ante No suicidal ideation at present. H/o fleeting suicidal thoughts. . Buddy Dutyoloft  Preterm labor  symptoms and general obstetric precautions including but not limited to vaginal bleeding, contractions, leaking of fluid and fetal movement were reviewed in detail with the patient. Please refer to After Visit Summary for other counseling recommendations.  Return in about 1 month (around 02/27/2018).  Future Appointments  Date Time Provider DeBonneau6/06/2018 11:00 AM [facility] [place] [facility]    CaLavonia DraftsMD

## 2018-01-30 NOTE — Patient Instructions (Signed)

## 2018-02-01 ENCOUNTER — Emergency Department (HOSPITAL_BASED_OUTPATIENT_CLINIC_OR_DEPARTMENT_OTHER): Payer: Medicaid Other

## 2018-02-01 ENCOUNTER — Other Ambulatory Visit: Payer: Self-pay

## 2018-02-01 ENCOUNTER — Emergency Department (HOSPITAL_BASED_OUTPATIENT_CLINIC_OR_DEPARTMENT_OTHER)
Admission: EM | Admit: 2018-02-01 | Discharge: 2018-02-01 | Disposition: A | Discharge status: Home / Self Care | Payer: Medicaid Other | Attending: Physician Assistant | Admitting: Physician Assistant

## 2018-02-01 ENCOUNTER — Encounter (HOSPITAL_BASED_OUTPATIENT_CLINIC_OR_DEPARTMENT_OTHER): Payer: Self-pay

## 2018-02-01 DIAGNOSIS — K828 Other specified diseases of gallbladder: Secondary | ICD-10-CM

## 2018-02-01 DIAGNOSIS — Z3A15 15 weeks gestation of pregnancy: Secondary | ICD-10-CM | POA: Insufficient documentation

## 2018-02-01 DIAGNOSIS — O26619 Liver and biliary tract disorders in pregnancy, unspecified trimester: Secondary | ICD-10-CM | POA: Diagnosis not present

## 2018-02-01 DIAGNOSIS — O9989 Other specified diseases and conditions complicating pregnancy, childbirth and the puerperium: Secondary | ICD-10-CM | POA: Diagnosis present

## 2018-02-01 DIAGNOSIS — R1011 Right upper quadrant pain: Secondary | ICD-10-CM

## 2018-02-01 LAB — URINALYSIS, ROUTINE W REFLEX MICROSCOPIC
BILIRUBIN URINE: NEGATIVE
GLUCOSE, UA: NEGATIVE mg/dL
HGB URINE DIPSTICK: NEGATIVE
Ketones, ur: NEGATIVE mg/dL
Nitrite: NEGATIVE
PH: 6 (ref 5.0–8.0)
Protein, ur: NEGATIVE mg/dL
Specific Gravity, Urine: 1.015 (ref 1.005–1.030)

## 2018-02-01 LAB — COMPREHENSIVE METABOLIC PANEL
ALBUMIN: 3.5 g/dL (ref 3.5–5.0)
ALK PHOS: 60 U/L (ref 38–126)
ALT: 11 U/L — AB (ref 14–54)
AST: 18 U/L (ref 15–41)
Anion gap: 8 (ref 5–15)
BUN: <5 mg/dL — ABNORMAL LOW (ref 6–20)
CALCIUM: 8.6 mg/dL — AB (ref 8.9–10.3)
CO2: 22 mmol/L (ref 22–32)
CREATININE: 0.45 mg/dL (ref 0.44–1.00)
Chloride: 106 mmol/L (ref 101–111)
GFR calc Af Amer: >60 mL/min (ref >60)
GFR calc non Af Amer: >60 mL/min (ref >60)
Glucose, Bld: 86 mg/dL (ref 65–99)
Potassium: 3.4 mmol/L — ABNORMAL LOW (ref 3.5–5.1)
SODIUM: 136 mmol/L (ref 135–145)
Total Bilirubin: 0.1 mg/dL — ABNORMAL LOW (ref 0.3–1.2)
Total Protein: 7.3 g/dL (ref 6.5–8.1)

## 2018-02-01 LAB — CBC WITH DIFFERENTIAL/PLATELET
BASOS PCT: 0 %
Basophils Absolute: 0 10*3/uL (ref 0.0–0.1)
EOS ABS: 0.1 10*3/uL (ref 0.0–0.7)
Eosinophils Relative: 1 %
HCT: 33.9 % — ABNORMAL LOW (ref 36.0–46.0)
HEMOGLOBIN: 11.3 g/dL — AB (ref 12.0–15.0)
Lymphocytes Relative: 23 %
Lymphs Abs: 2.5 10*3/uL (ref 0.7–4.0)
MCH: 27.2 pg (ref 26.0–34.0)
MCHC: 33.3 g/dL (ref 30.0–36.0)
MCV: 81.7 fL (ref 78.0–100.0)
Monocytes Absolute: 1 10*3/uL (ref 0.1–1.0)
Monocytes Relative: 9 %
NEUTROS PCT: 67 %
Neutro Abs: 7 10*3/uL (ref 1.7–7.7)
Platelets: 288 10*3/uL (ref 150–400)
RBC: 4.15 MIL/uL (ref 3.87–5.11)
RDW: 13.2 % (ref 11.5–15.5)
WBC: 10.6 10*3/uL — ABNORMAL HIGH (ref 4.0–10.5)

## 2018-02-01 LAB — HCG, QUANTITATIVE, PREGNANCY: hCG, Beta Chain, Quant, S: 41687 m[IU]/mL — ABNORMAL HIGH (ref <5)

## 2018-02-01 LAB — URINALYSIS, MICROSCOPIC (REFLEX)

## 2018-02-01 LAB — LIPASE, BLOOD: Lipase: 31 U/L (ref 11–51)

## 2018-02-01 NOTE — ED Notes (Signed)
Patient is out of room in ultrasound at this time.

## 2018-02-01 NOTE — ED Triage Notes (Addendum)
C/o right side abd pain x today-denies n/v/d, vaginal d/or bleeding-pt is [redacted] weeks pregnant- G3 P0 SAb2 -NAD-steady gait

## 2018-02-01 NOTE — ED Provider Notes (Signed)
[facility] EMERGENCY DEPARTMENT Provider Note   CSN: [id number] Arrival date & time: 02/01/18  1934     History   Chief Complaint Chief Complaint  Patient presents with  . Abdominal Pain    HPI Tammy Mejia is a 27 y.o. female.  HPI   27 year old female presenting with right upper quadrant pain should.  She reports that the pain started couple days ago got worse and less today.  Worse with palpation and eating.  Patient is [redacted] weeks pregnant.  Mild nausea.  Past Medical History:  Diagnosis Date  . Anxiety   . 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 Back pain   . Deficiency of potassium   . Depression   . Hypokalemia   . Migraine     Patient Active Problem List   Diagnosis Date Noted  . Depression affecting pregnancy, antepartum 01/30/2018  . 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 Rubella non-immune status, antepartum 12/06/2017  . Supervision of normal first pregnancy, antepartum 12/05/2017  . Anxiety and depression 03/16/2016  . Migraine 08/23/2014  . Insomnia 06/20/2014  . Dysmenorrhea 06/20/2014  . Palpitations 06/29/2013    Past Surgical History:  Procedure Laterality Date  . DILATION AND EVACUATION N/A 05/13/2017   Procedure: DILATATION AND EVACUATION;  Surgeon: Reva Bores, MD;  Location: [facility];  Service: Gynecology;  Laterality: N/A;     OB History    Gravida  3   Para      Term      Preterm      AB  2   Living        SAB  2   TAB      Ectopic      Multiple  0   Live Births               Home Medications    Prior to Admission medications   Medication Sig Start Date End Date Taking? Authorizing Provider  Prenatal Vit-Fe Fumarate-FA (MULTIVITAMIN-PRENATAL) 27-0.8 MG TABS tablet Take 1 tablet by mouth daily at 12 noon.    [provider]  promethazine (PHENERGAN) 25 MG tablet Take 1 tablet (25 mg total) by mouth every 6 (six) hours as needed for nausea or  vomiting. 12/14/17   Levie Heritage, DO  sertraline (ZOLOFT) 25 MG tablet Take 1 tablet (25 mg total) by mouth daily. 01/30/18   Willodean Rosenthal, MD  sertraline (ZOLOFT) 50 MG tablet Take 1 tablet (50 mg total) by mouth daily. After the 7 days of 25 mg daily 01/30/18   Willodean Rosenthal, MD    Family History Family History  Problem Relation Age of Onset  . COPD Mother   . Diabetes Father   . Cancer Neg Hx   . Hypertension Neg Hx   . Stroke Neg Hx     Social History Social History   Tobacco Use  . Smoking status: Never Smoker  . Smokeless tobacco: Never Used  Substance Use Topics  . Alcohol use: No  . Drug use: No     Allergies   Patient has no known allergies.   Review of Systems Review of Systems  Constitutional: Negative for fatigue and fever.  Gastrointestinal: Positive for abdominal pain and nausea.  All other systems reviewed and are negative.    Physical Exam Updated Vital Signs BP 105/71 (BP Location: Left Arm)   Pulse 80   Temp 98.4 F (36.9 C) (Oral)  Resp 16   Ht  (1.549 m)   Wt 50 kg (110 lb 3.7 oz)   LMP 10/10/2017 (Exact Date)   SpO2 100%   BMI 20.83 kg/m   Physical Exam  Constitutional: She is oriented to person, place, and time. She appears well-developed and well-nourished.  HENT:  Head: Normocephalic and atraumatic.  Eyes: Right eye exhibits no discharge.  Cardiovascular: Normal rate.  Pulmonary/Chest: Effort normal.  Abdominal: Normal appearance. There is tenderness in the right upper quadrant.  Genitourinary: Guaiac stool:    Neurological: She is oriented to person, place, and time.  Skin: Skin is warm and dry. She is not diaphoretic.  Psychiatric: She has a normal mood and affect.  Nursing note and vitals reviewed.    ED Treatments / Results  Labs (all labs ordered are listed, but only abnormal results are displayed) Labs Reviewed  URINALYSIS, ROUTINE W REFLEX MICROSCOPIC - Abnormal; Notable for the  following components:      Result Value   Leukocytes, UA TRACE (*)    All other components within normal limits  URINALYSIS, MICROSCOPIC (REFLEX) - Abnormal; Notable for the following components:   Bacteria, UA MANY (*)    All other components within normal limits  CBC WITH DIFFERENTIAL/PLATELET  COMPREHENSIVE METABOLIC PANEL  LIPASE, BLOOD  HCG, QUANTITATIVE, PREGNANCY    EKG None  Radiology US Abdomen Limited Ruq  Result Date: 02/01/2018 CLINICAL DATA:  RIGHT upper quadrant pain for 18 hours. EXAM: ULTRASOUND ABDOMEN LIMITED RIGHT UPPER QUADRANT COMPARISON:  None. FINDINGS: Gallbladder: Echogenic material within the gallbladder neck, most suggestive of tumefactive sludge. No discrete gallstones. No gallbladder wall thickening, pericholecystic fluid or other signs of acute cholecystitis. No sonographic Murphy's sign elicited during the exam. Common bile duct: Diameter: 1 mm Liver: No focal lesion is identified. Liver parenchyma is diffusely echogenic indicating fatty infiltration. Portal vein is patent on color Doppler imaging with normal direction of blood flow towards the liver. IMPRESSION: 1. Tumefactive sludge within the gallbladder. No evidence of acute cholecystitis. 2. No bile duct dilatation. 3. Fatty infiltration of the liver. Electronically Signed   By: Bary Richard M.D.   On: 02/01/2018 22:35    Procedures Procedures (including critical care time)  Medications Ordered in ED Medications - No data to display   Initial Impression / Assessment and Plan / ED Course  I have reviewed the triage vital signs and the nursing notes.  Pertinent labs & imaging results that were available during my care of the patient were reviewed by me and considered in my medical decision making (see chart for details).      27 year old female presenting with right upper quadrant pain should.  She reports that the pain started couple days ago got worse and less today.  Worse with palpation and  eating.  Patient is [redacted] weeks pregnant.  Mild nausea.   9:45 PM Patient had no complicatioons of the pregnancy including no cramping no bleeding.  Isolated right upper quadrant tenderness.  Will get labs and ultrasound.  Present shows sludge-like findings.  Given the patient is afebrile, no evidence of infection and normal LFTs, will have her follow-up with surgeon as outpatient.  Will let them decide when the appropriate time is to do surgery given that she is pregnant.  Strict return precautions including fever worsening symptoms explain to patient.  Final Clinical Impressions(s) / ED Diagnoses   Final diagnoses:  RUQ abdominal pain    ED Discharge Orders    None  Abelino Derrick, MD 02/02/18 2041

## 2018-02-01 NOTE — Discharge Instructions (Signed)
Please eat a low-fat diet.  As we discussed there is some sludge in your gallbladder.  It may need to be removed, however we understand that you are in early pregnancy.  We will need you to discuss this with a outpatient surgeon.  Please do a low-fat diet until you are able to see them.  If you have any fever, increased pain, nausea, vomiting please return immediately to the emergency department.

## 2018-02-11 IMAGING — US US PELVIS COMPLETE
1 series · 14 of 25 positions shown · non-contrast
Comparison: None.

CLINICAL DATA: Status post D&C 4 days ago following a failed
pregnancy.

EXAM:
TRANSABDOMINAL AND TRANSVAGINAL ULTRASOUND OF PELVIS
DOPPLER ULTRASOUND OF OVARIES
TECHNIQUE: Both transabdominal and transvaginal ultrasound examinations of the
pelvis were performed. Transabdominal technique was performed for
global imaging of the pelvis including uterus, ovaries, adnexal
regions, and pelvic cul-de-sac.
It was necessary to proceed with endovaginal exam following the
transabdominal exam to visualize the ovaries. Color and duplex
Doppler ultrasound was utilized to evaluate blood flow to the
ovaries.

[Series 1: us pelvis complete · 0.21mm/px · 14 of 54 slices shown]
[im 1/54]
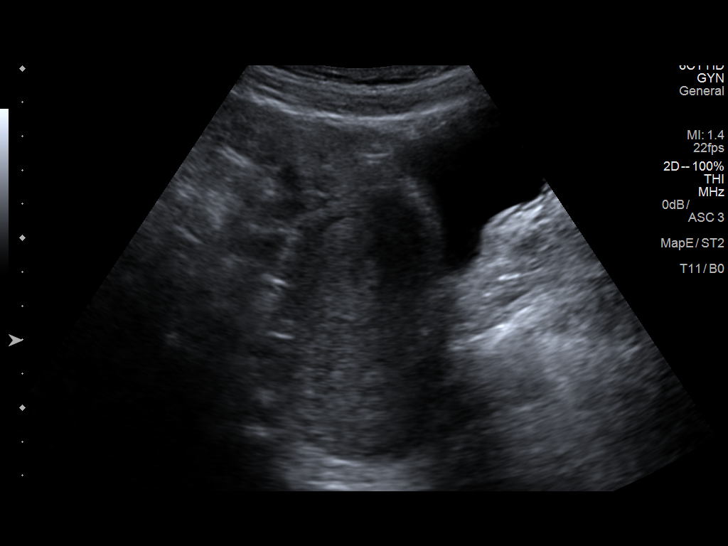
[im 5/54]
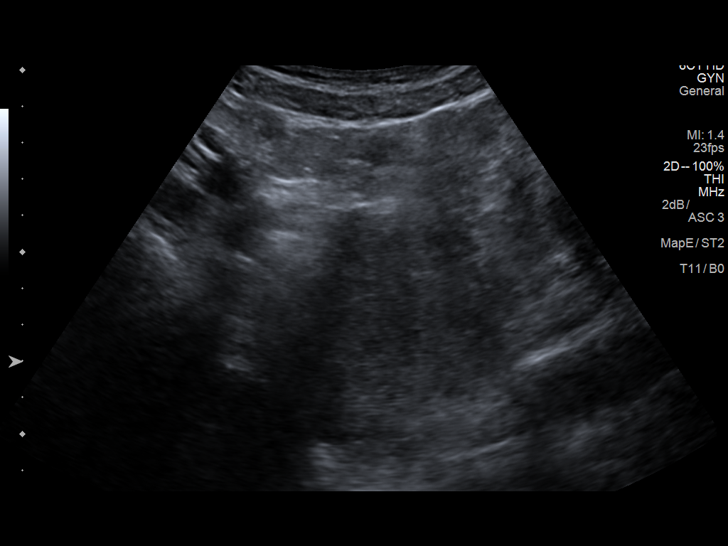
[im 9/54]
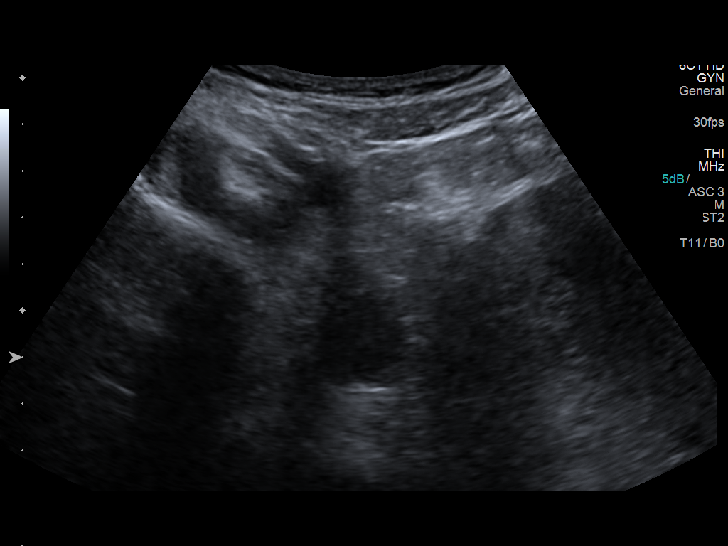
[im 14/54]
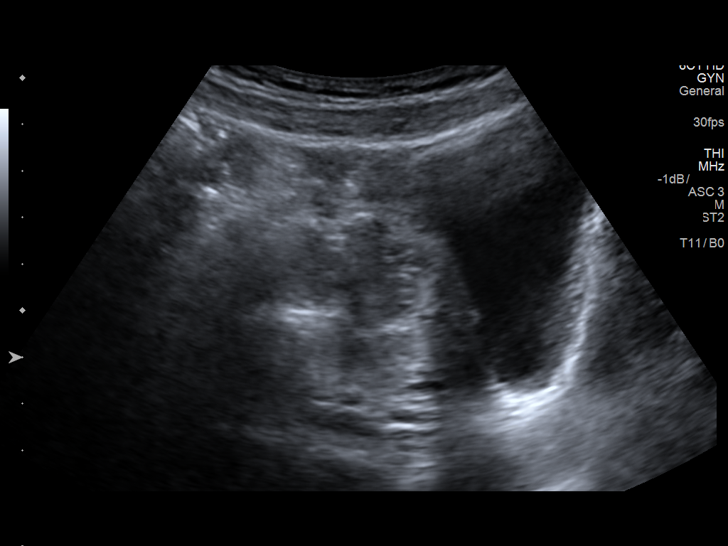
[im 18/54]
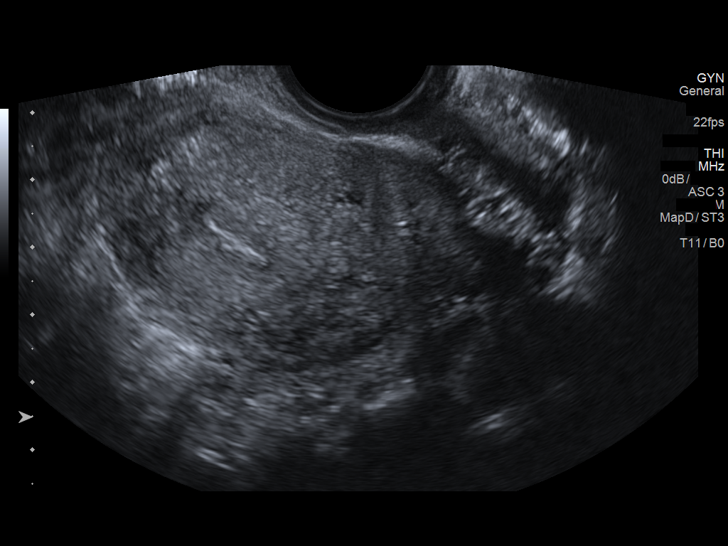
[im 20/54]
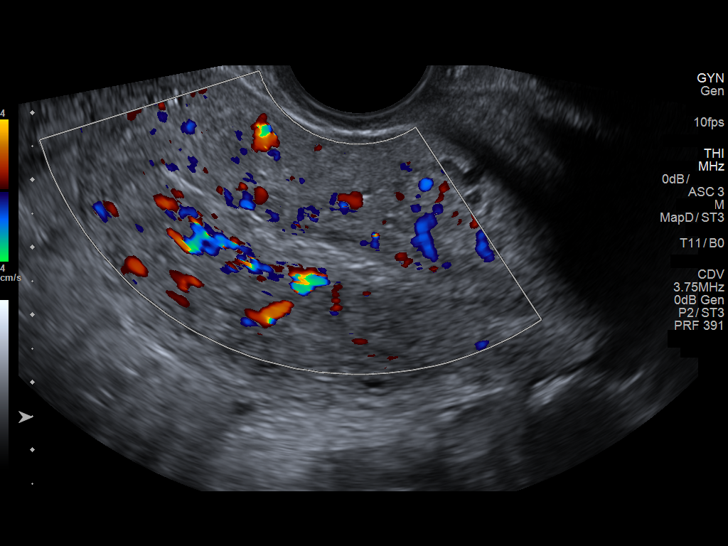
[im 25/54]
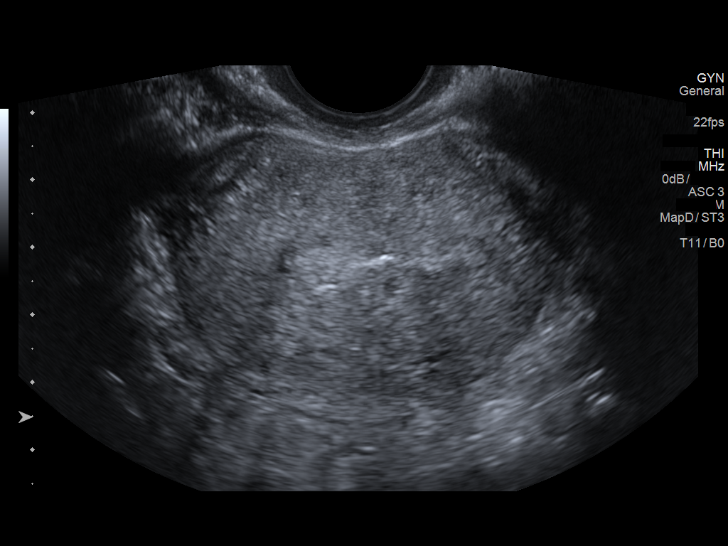
[im 29/54]
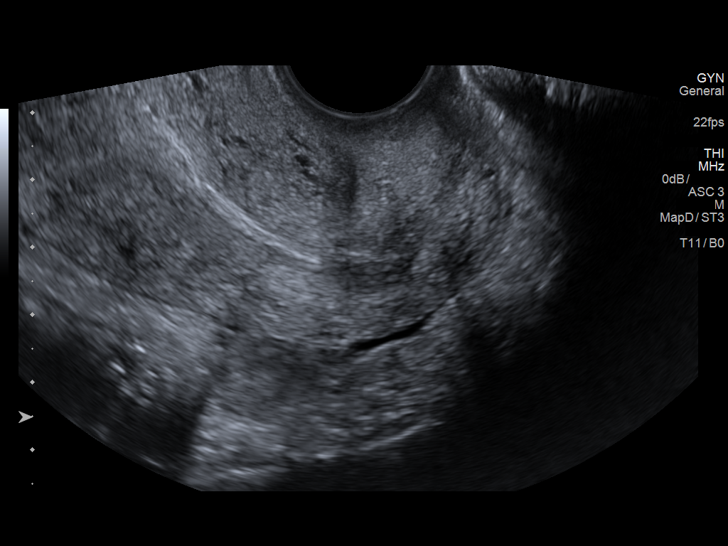
[im 34/54]
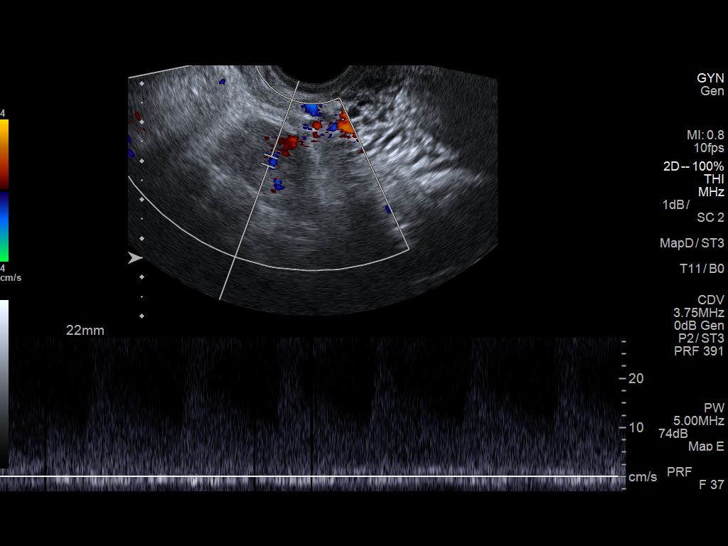
[im 36/54]
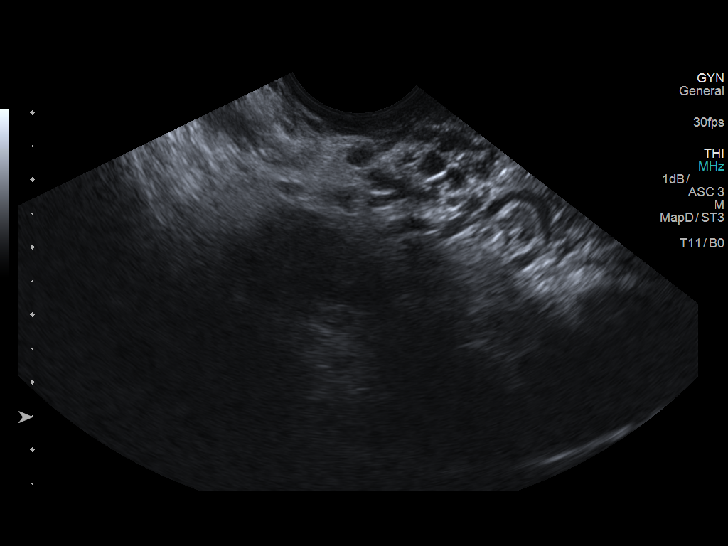
[im 40/54]
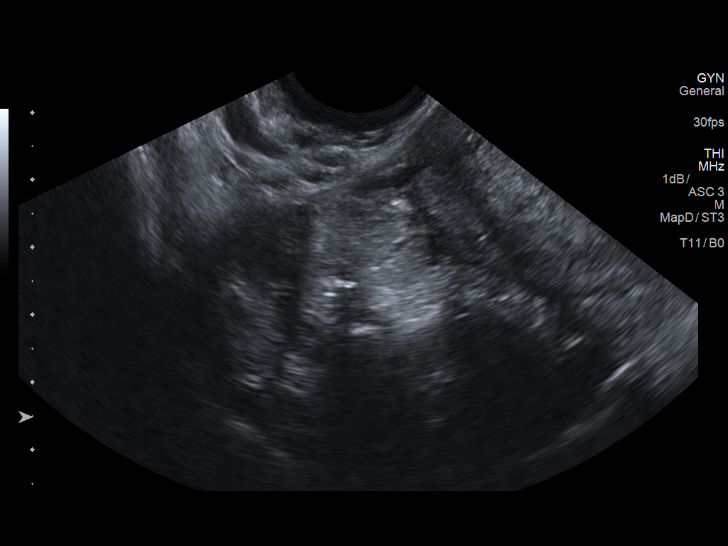
[im 45/54]
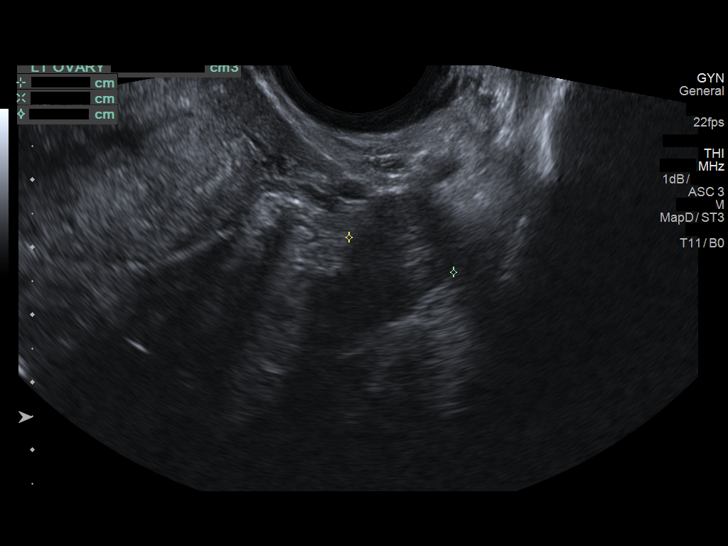
[im 49/54]
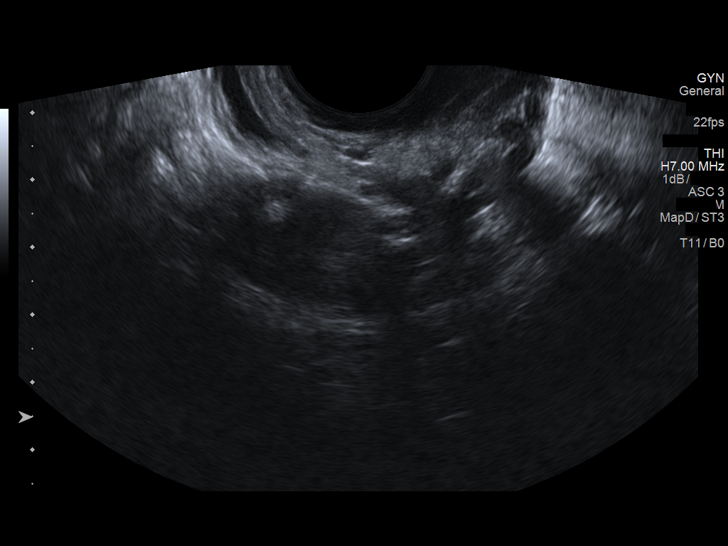
[im 54/54]
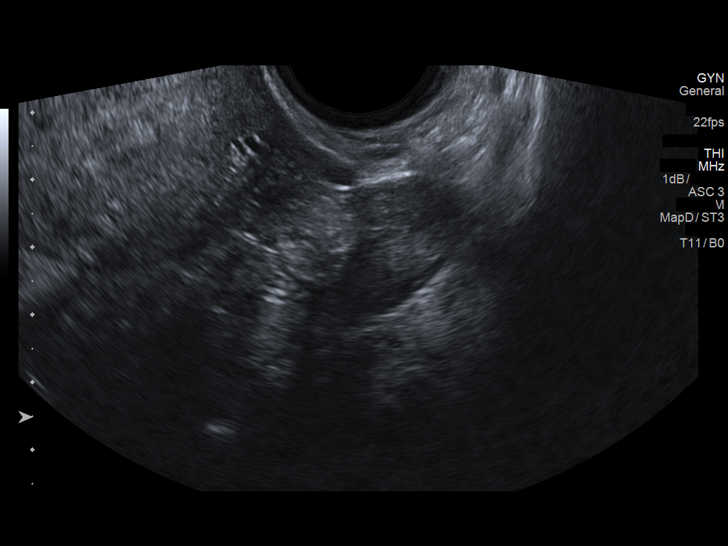

[14 of 25 positions shown; findings below may reference images not displayed]

FINDINGS: Uterus

Measurements: 8.7 x 4.8 x 5.6 cm. No fibroids or other mass
visualized.

Endometrium

Thickness: 8 mm.  No focal abnormality visualized.

Right ovary

Measurements: 3.4 x 1.4 x 2.2 cm. Normal appearance/no adnexal mass.

Left ovary

Measurements: 3 x 1.8 x 1.6 cm. Normal appearance/no adnexal mass.

Pulsed Doppler evaluation of both ovaries demonstrates normal
low-resistance arterial and venous waveforms.

Other findings

No abnormal free fluid.
IMPRESSION: Normal pelvic ultrasound. There is no retained products of
conception.

## 2018-02-20 ENCOUNTER — Encounter (HOSPITAL_COMMUNITY): Payer: Self-pay

## 2018-02-21 ENCOUNTER — Ambulatory Visit (INDEPENDENT_AMBULATORY_CARE_PROVIDER_SITE_OTHER): Payer: Medicaid Other | Admitting: Medical

## 2018-02-21 ENCOUNTER — Encounter: Payer: Self-pay | Admitting: Medical

## 2018-02-21 VITALS — BP 95/62 | HR 73 | Temp 98.2°F | Resp 16 | Ht 61.0 in | Wt 114.0 lb

## 2018-02-21 DIAGNOSIS — J029 Acute pharyngitis, unspecified: Secondary | ICD-10-CM

## 2018-02-21 LAB — POCT RAPID STREP A (OFFICE): Rapid Strep A Screen: NEGATIVE

## 2018-02-21 MED ORDER — AZITHROMYCIN 250 MG PO TABS: ORAL_TABLET | ORAL | 0 refills | Status: DC

## 2018-02-21 NOTE — Progress Notes (Signed)
Subjective:    Patient ID: Tammy Mejia, female    DOB: 10/03/1990, 27 y.o.   MRN: [id number]  HPI   Pt in states she is 5 months pregnant. Pt ob is Dr. Gust RungStenson, Earlene Plateravis and [place].(at the [facility]).  Pt states mild st last night but then severe this morning. Hurts to swallow  Pt had mild runny nose on and stuffy nose for past 2 months.(no obvious allergy symptoms). She notes half her pregnancy mild stuffy.  No sinus pain. Not blowing out mucus from nose.  Mild chill this morning. No sweats. Mild body aches.  Pt husband dx with viral syndrome just recently. But not the flu.  Pt states severe pain on swallowing this morning and severe pain even now when swollowing.   Review of Systems  Constitutional: Negative for chills, fatigue and fever.  HENT: Positive for sore throat. Negative for congestion, ear pain, sinus pressure, sinus pain, sneezing and trouble swallowing.   Respiratory: Negative for cough, chest tightness, shortness of breath and wheezing.   Cardiovascular: Negative for chest pain and palpitations.  Gastrointestinal: Negative for abdominal pain.  Musculoskeletal: Positive for myalgias. Negative for back pain.  Skin: Negative for rash.  Neurological: Negative for dizziness, numbness and headaches.  Hematological: Positive for adenopathy. Does not bruise/bleed easily.  Psychiatric/Behavioral: Negative for behavioral problems, confusion, decreased concentration and suicidal ideas. The patient is not nervous/anxious.     Past Medical History:  Diagnosis Date  . Anxiety   . 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 Back pain   . Deficiency of potassium   . Depression   . Hypokalemia   . Migraine      Social History   Socioeconomic History  . Marital status: Single    Spouse name: Not on file  . Number of children: Not on file  . Years of education: Not on file  . Highest education level: Not on file  Occupational History  . Not on file   Social Needs  . Financial resource strain: Not on file  . Food insecurity:    Worry: Not on file    Inability: Not on file  . Transportation needs:    Medical: Not on file    Non-medical: Not on file  Tobacco Use  . Smoking status: Never Smoker  . Smokeless tobacco: Never Used  Substance and Sexual Activity  . Alcohol use: No  . Drug use: No  . Sexual activity: Yes    Birth control/protection: None  Lifestyle  . Physical activity:    Days per week: Not on file    Minutes per session: Not on file  . Stress: Not on file  Relationships  . Social connections:    Talks on phone: Not on file    Gets together: Not on file    Attends religious service: Not on file    Active member of club or organization: Not on file    Attends meetings of clubs or organizations: Not on file    Relationship status: Not on file  . Intimate partner violence:    Fear of current or ex partner: Not on file    Emotionally abused: Not on file    Physically abused: Not on file    Forced sexual activity: Not on file  Other Topics Concern  . Not on file  Social History Narrative  . Not on file    Past Surgical History:  Procedure Laterality Date  . DILATION AND EVACUATION  N/A 05/13/2017   Procedure: DILATATION AND EVACUATION;  Surgeon: Reva Bores, MD;  Location: [facility];  Service: Gynecology;  Laterality: N/A;    Family History  Problem Relation Age of Onset  . COPD Mother   . Diabetes Father   . Cancer Neg Hx   . Hypertension Neg Hx   . Stroke Neg Hx     No Known Allergies  Current Outpatient Medications on File Prior to Visit  Medication Sig Dispense Refill  . Prenatal Vit-Fe Fumarate-FA (MULTIVITAMIN-PRENATAL) 27-0.8 MG TABS tablet Take 1 tablet by mouth daily at 12 noon.    . promethazine (PHENERGAN) 25 MG tablet Take 1 tablet (25 mg total) by mouth every 6 (six) hours as needed for nausea or vomiting. 30 tablet 2  . sertraline (ZOLOFT) 25 MG tablet Take 1 tablet (25 mg total) by  mouth daily. 7 tablet 0  . sertraline (ZOLOFT) 50 MG tablet Take 1 tablet (50 mg total) by mouth daily. After the 7 days of 25 mg daily 30 tablet 2   No current facility-administered medications on file prior to visit.     BP 95/62   Pulse 73   Temp 98.2 F (36.8 C) (Oral)   Resp 16   Ht 5\' 1"  (1.549 m)   Wt 114 lb (51.7 kg)   LMP 10/10/2017 (Exact Date)   SpO2 100%   BMI 21.54 kg/m       Objective:   Physical Exam  General  Mental Status - Alert. General Appearance - Well groomed. Not in acute distress.  Skin Rashes- No Rashes.  HEENT Head- Normal. Ear Auditory Canal - Left- Normal. Right - Normal.Tympanic Membrane- Left- Normal. Right- Normal. Eye Sclera/Conjunctiva- Left- Normal. Right- Normal. Nose & Sinuses Nasal Mucosa- Left-  Boggy and Congested. Right-  Boggy and  Congested.Bilateral maxillary and frontal sinus pressure. Mouth & Throat Lips: Upper Lip- Normal: no dryness, cracking, pallor, cyanosis, or vesicular eruption. Lower Lip-Normal: no dryness, cracking, pallor, cyanosis or vesicular eruption. Buccal Mucosa- Bilateral- No Aphthous ulcers. Oropharynx- No Discharge or Erythema. Tonsils: Characteristics- Bilateral- mild Erythema. Size/Enlargement- Bilateral- No enlargement. Discharge- bilateral-None.  Neck Neck- Supple. No Masses. Mild-moderate submandibulare node enlargement worse on the rt side. No neck stiffness  Chest and Lung Exam Auscultation: Breath Sounds:-Clear even and unlabored.  Cardiovascular Auscultation:Rythm- Regular, rate and rhythm. Murmurs & Other Heart Sounds:Ausculatation of the heart reveal- No Murmurs.  Lymphatic Head & Neck General Head & Neck Lymphatics: Bilateral: Description- see neck exam.       Assessment & Plan:  Your sore throat/pharyngitis may represent viral infection versus bacterial(such as strep).  Your rapid strep test was negative.  We are sending out a throat culture today.  You have some moderate  findings on exam presently.  You may need to take antibiotic but since you are pregnant I do not want you to take take antibiotic unless it is necessary.  Throat culture is pending.  In the event you get severe signs or symptoms as described pending throat culture then you can start a azithromycin.  Print prescription given today.  During the interim recommend use throat lozenges and Tylenol for pain.  You can update [place] on Thursday how you are doing by my chart and we will update you as well when throat culture testing is resulted.  Follow-up in 7 days or as needed.  Esperanza Richters, PA-C

## 2018-02-21 NOTE — Patient Instructions (Addendum)
Your sore throat/pharyngitis may represent viral infection versus bacterial(such as strep).  Your rapid strep test was negative.  We are sending out a throat culture today.  You have some moderate findings on exam presently.  You may need to take antibiotic but since you are pregnant I do not want you to take take antibiotic unless it is necessary.  Throat culture is pending.  In the event you get severe signs or symptoms as described pending throat culture then you can start a azithromycin.  Print prescription given today.  During the interim recommend use throat lozenges and Tylenol for pain.(ok'd by ob per pt)  You can update us on Thursday how you are doing by my chart and we will update you as well when throat culture testing is resulted.  Follow-up in 7 days or as needed.

## 2018-02-22 LAB — CULTURE, GROUP A STREP
MICRO NUMBER: 90670540
SPECIMEN QUALITY: ADEQUATE

## 2018-02-27 ENCOUNTER — Ambulatory Visit (INDEPENDENT_AMBULATORY_CARE_PROVIDER_SITE_OTHER): Payer: Medicaid Other | Admitting: Obstetrics & Gynecology

## 2018-02-27 ENCOUNTER — Encounter: Payer: Self-pay | Admitting: Obstetrics & Gynecology

## 2018-02-27 ENCOUNTER — Ambulatory Visit (HOSPITAL_COMMUNITY)
Admission: RE | Admit: 2018-02-27 | Discharge: 2018-02-27 | Disposition: A | Discharge status: Home / Self Care | Payer: Medicaid Other | Source: Ambulatory Visit | Attending: Obstetrics and Gynecology | Admitting: Obstetrics and Gynecology

## 2018-02-27 ENCOUNTER — Other Ambulatory Visit: Payer: Self-pay | Admitting: Obstetrics and Gynecology

## 2018-02-27 ENCOUNTER — Other Ambulatory Visit: Payer: Self-pay

## 2018-02-27 VITALS — BP 99/67 | HR 79 | Wt 113.0 lb

## 2018-02-27 DIAGNOSIS — Z3A19 19 weeks gestation of pregnancy: Secondary | ICD-10-CM

## 2018-02-27 DIAGNOSIS — O9989 Other specified diseases and conditions complicating pregnancy, childbirth and the puerperium: Secondary | ICD-10-CM

## 2018-02-27 DIAGNOSIS — Z3A18 18 weeks gestation of pregnancy: Secondary | ICD-10-CM | POA: Insufficient documentation

## 2018-02-27 DIAGNOSIS — O09899 Supervision of other high risk pregnancies, unspecified trimester: Secondary | ICD-10-CM

## 2018-02-27 DIAGNOSIS — R87810 Cervical high risk human papillomavirus (HPV) DNA test positive: Secondary | ICD-10-CM

## 2018-02-27 DIAGNOSIS — O99340 Other mental disorders complicating pregnancy, unspecified trimester: Secondary | ICD-10-CM

## 2018-02-27 DIAGNOSIS — Z363 Encounter for antenatal screening for malformations: Secondary | ICD-10-CM

## 2018-02-27 DIAGNOSIS — F329 Major depressive disorder, single episode, unspecified: Secondary | ICD-10-CM

## 2018-02-27 DIAGNOSIS — Z3400 Encounter for supervision of normal first pregnancy, unspecified trimester: Secondary | ICD-10-CM

## 2018-02-27 DIAGNOSIS — Z3480 Encounter for supervision of other normal pregnancy, unspecified trimester: Secondary | ICD-10-CM

## 2018-02-27 DIAGNOSIS — R002 Palpitations: Secondary | ICD-10-CM

## 2018-02-27 DIAGNOSIS — R87610 Atypical squamous cells of undetermined significance on cytologic smear of cervix (ASC-US): Secondary | ICD-10-CM

## 2018-02-27 DIAGNOSIS — Z283 Underimmunization status: Secondary | ICD-10-CM

## 2018-02-27 DIAGNOSIS — F32A Depression, unspecified: Secondary | ICD-10-CM

## 2018-02-27 NOTE — Progress Notes (Signed)
   PRENATAL VISIT NOTE  Subjective:  Tammy Mejia is a 27 y.o. G3P0020 at 58w0dbeing seen today for ongoing prenatal care.  She is currently monitored for the following issues for this low-risk pregnancy and has Palpitations; Insomnia; Dysmenorrhea; Migraine; Anxiety and depression; Supervision of normal first pregnancy, antepartum; Rubella non-immune status, antepartum; ASCUS with positive high risk HPV cervical; and Depression affecting pregnancy, antepartum on their problem list.  Patient reports no complaints.  Contractions: Not present. Vag. Bleeding: None.  Movement: Absent. Denies leaking of fluid.   The following portions of the patient's history were reviewed and updated as appropriate: allergies, current medications, past family history, past medical history, past social history, past surgical history and problem list. Problem list updated.  Objective:   Vitals:   02/27/18 0820  BP: 99/67  Pulse: 79  Weight: 113 lb (51.3 kg)    Fetal Status: Fetal Heart Rate (bpm): 150   Movement: Absent     General:  Alert, oriented and cooperative. Patient is in no acute distress.  Skin: Skin is warm and dry. No rash noted.   Cardiovascular: Normal heart rate noted  Respiratory: Normal respiratory effort, no problems with respiration noted  Abdomen: Soft, gravid, appropriate for gestational age.  Pain/Pressure: Absent     Pelvic: Cervical exam deferred        Extremities: Normal range of motion.  Edema: None  Mental Status: Normal mood and affect. Normal behavior. Normal judgment and thought content.   Assessment and Plan:  Pregnancy: G3P0020 at 14w0d1. Supervision of other normal pregnancy, antepartum  - AFP, Quad Screen  2. Supervision of normal first pregnancy, antepartum Pt prefers a female provider for delivery if Dr. StNehemiah Settler I cannot deliver her. I explained other the structure of our practice and the role of the midwives/APPs. I have explained to her that there is  not guarantee of the gender of her delivering provider. She says that she still wants to stay with our practice.   3. Rubella non-immune status, antepartum Need MMR pp  4. Depression affecting pregnancy, antepartum stable  5. ASCUS with positive high risk HPV cervical Needs repeat PP  6. Palpitations   Preterm labor symptoms and general obstetric precautions including but not limited to vaginal bleeding, contractions, leaking of fluid and fetal movement were reviewed in detail with the patient. Please refer to After Visit Summary for other counseling recommendations.  Return in about 1 month (around 03/27/2018).  Future Appointments  Date Time Provider DeHenderson6/06/2018 11:00 AM [facility] [place] [facility]    Ca[place]avonia DraftsMD

## 2018-02-27 NOTE — Patient Instructions (Signed)
Second Trimester of Pregnancy The second trimester is from week 13 through week 28, month 4 through 6. This is often the time in pregnancy that you feel your best. Often times, morning sickness has lessened or quit. You may have more energy, and you may get hungry more often. Your unborn baby (fetus) is growing rapidly. At the end of the sixth month, he or she is about 9 inches long and weighs about 1 pounds. You will likely feel the baby move (quickening) between 18 and 20 weeks of pregnancy. Follow these instructions at home:  Avoid all smoking, herbs, and alcohol. Avoid drugs not approved by your doctor.  Do not use any tobacco products, including cigarettes, chewing tobacco, and electronic cigarettes. If you need help quitting, ask your doctor. You may get counseling or other support to help you quit.  Only take medicine as told by your doctor. Some medicines are safe and some are not during pregnancy.  Exercise only as told by your doctor. Stop exercising if you start having cramps.  Eat regular, healthy meals.  Wear a good support bra if your breasts are tender.  Do not use hot tubs, steam rooms, or saunas.  Wear your seat belt when driving.  Avoid raw meat, uncooked cheese, and liter boxes and soil used by cats.  Take your prenatal vitamins.  Take 1500-2000 milligrams of calcium daily starting at the 20th week of pregnancy until you deliver your baby.  Try taking medicine that helps you poop (stool softener) as needed, and if your doctor approves. Eat more fiber by eating fresh fruit, vegetables, and whole grains. Drink enough fluids to keep your pee (urine) clear or pale yellow.  Take warm water baths (sitz baths) to soothe pain or discomfort caused by hemorrhoids. Use hemorrhoid cream if your doctor approves.  If you have puffy, bulging veins (varicose veins), wear support hose. Raise (elevate) your feet for 15 minutes, 3-4 times a day. Limit salt in your diet.  Avoid heavy  lifting, wear low heals, and sit up straight.  Rest with your legs raised if you have leg cramps or low back pain.  Visit your dentist if you have not gone during your pregnancy. Use a soft toothbrush to brush your teeth. Be gentle when you floss.  You can have sex (intercourse) unless your doctor tells you not to.  Go to your doctor visits. Get help if:  You feel dizzy.  You have mild cramps or pressure in your lower belly (abdomen).  You have a nagging pain in your belly area.  You continue to feel sick to your stomach (nauseous), throw up (vomit), or have watery poop (diarrhea).  You have bad smelling fluid coming from your vagina.  You have pain with peeing (urination). Get help right away if:  You have a fever.  You are leaking fluid from your vagina.  You have spotting or bleeding from your vagina.  You have severe belly cramping or pain.  You lose or gain weight rapidly.  You have trouble catching your breath and have chest pain.  You notice sudden or extreme puffiness (swelling) of your face, hands, ankles, feet, or legs.  You have not felt the baby move in over an hour.  You have severe headaches that do not go away with medicine.  You have vision changes. This information is not intended to replace advice given to you by your health care provider. Make sure you discuss any questions you have with your health care   provider. Document Released: 12/01/2009 Document Revised: 02/12/2016 Document Reviewed: 11/07/2012 Elsevier Interactive Patient Education  2017 Elsevier Inc.  

## 2018-03-01 ENCOUNTER — Other Ambulatory Visit: Payer: Self-pay

## 2018-03-01 ENCOUNTER — Other Ambulatory Visit (HOSPITAL_COMMUNITY): Payer: Self-pay | Admitting: *Deleted

## 2018-03-01 DIAGNOSIS — R9389 Abnormal findings on diagnostic imaging of other specified body structures: Secondary | ICD-10-CM

## 2018-03-02 ENCOUNTER — Other Ambulatory Visit: Payer: Self-pay

## 2018-03-03 ENCOUNTER — Encounter (HOSPITAL_COMMUNITY): Payer: Self-pay

## 2018-03-03 ENCOUNTER — Ambulatory Visit (HOSPITAL_COMMUNITY)
Admission: RE | Admit: 2018-03-03 | Discharge: 2018-03-03 | Disposition: A | Discharge status: Home / Self Care | Payer: Medicaid Other | Source: Ambulatory Visit | Attending: Obstetrics and Gynecology | Admitting: Obstetrics and Gynecology

## 2018-03-03 ENCOUNTER — Ambulatory Visit (HOSPITAL_COMMUNITY): Payer: Medicaid Other

## 2018-03-03 DIAGNOSIS — Z3A19 19 weeks gestation of pregnancy: Secondary | ICD-10-CM | POA: Insufficient documentation

## 2018-03-03 DIAGNOSIS — O283 Abnormal ultrasonic finding on antenatal screening of mother: Secondary | ICD-10-CM | POA: Diagnosis not present

## 2018-03-03 DIAGNOSIS — O99340 Other mental disorders complicating pregnancy, unspecified trimester: Secondary | ICD-10-CM

## 2018-03-03 DIAGNOSIS — F329 Major depressive disorder, single episode, unspecified: Secondary | ICD-10-CM

## 2018-03-03 DIAGNOSIS — F32A Depression, unspecified: Secondary | ICD-10-CM

## 2018-03-03 DIAGNOSIS — R9389 Abnormal findings on diagnostic imaging of other specified body structures: Secondary | ICD-10-CM

## 2018-03-03 HISTORY — DX: Papillomavirus as the cause of diseases classified elsewhere: B97.7

## 2018-03-08 LAB — AFP TETRA
DIA MOM VALUE: 0.74
DIA Value (EIA): 161.93 pg/mL
DSR (BY AGE) 1 IN: 918
DSR (SECOND TRIMESTER) 1 IN: 10000
GESTATIONAL AGE AFP: 19 wk
MATERNAL AGE AT EDD: 27.1 a
MSAFP MOM: 2.04
MSAFP: 119.9 ng/mL
MSHCG Mom: 0.59
MSHCG: 18859 m[IU]/mL
OSB RISK: 737
T18 (By Age): 1:3576 {titer}
Test Results:: NEGATIVE
UE3 MOM: 1.43
WEIGHT: 113 [lb_av]
uE3 Value: 2.5 ng/mL

## 2018-03-09 ENCOUNTER — Other Ambulatory Visit: Payer: Self-pay | Admitting: Obstetrics & Gynecology

## 2018-03-14 ENCOUNTER — Encounter: Payer: Self-pay | Admitting: Obstetrics & Gynecology

## 2018-03-17 ENCOUNTER — Encounter: Payer: Self-pay | Admitting: Family Medicine

## 2018-03-22 ENCOUNTER — Emergency Department (HOSPITAL_BASED_OUTPATIENT_CLINIC_OR_DEPARTMENT_OTHER)
Admission: EM | Admit: 2018-03-22 | Discharge: 2018-03-22 | Disposition: A | Discharge status: Home / Self Care | Payer: Medicaid Other | Attending: Emergency Medicine | Admitting: Emergency Medicine

## 2018-03-22 ENCOUNTER — Other Ambulatory Visit: Payer: Self-pay

## 2018-03-22 ENCOUNTER — Encounter (HOSPITAL_BASED_OUTPATIENT_CLINIC_OR_DEPARTMENT_OTHER): Payer: Self-pay | Admitting: Emergency Medicine

## 2018-03-22 DIAGNOSIS — O26899 Other specified pregnancy related conditions, unspecified trimester: Secondary | ICD-10-CM

## 2018-03-22 DIAGNOSIS — R102 Pelvic and perineal pain: Secondary | ICD-10-CM | POA: Diagnosis not present

## 2018-03-22 DIAGNOSIS — Z79899 Other long term (current) drug therapy: Secondary | ICD-10-CM | POA: Diagnosis not present

## 2018-03-22 DIAGNOSIS — Z3A22 22 weeks gestation of pregnancy: Secondary | ICD-10-CM | POA: Diagnosis not present

## 2018-03-22 LAB — CBC WITH DIFFERENTIAL/PLATELET
BASOS PCT: 0 %
Basophils Absolute: 0 10*3/uL (ref 0.0–0.1)
EOS ABS: 0.1 10*3/uL (ref 0.0–0.7)
Eosinophils Relative: 1 %
HCT: 30.5 % — ABNORMAL LOW (ref 36.0–46.0)
HEMOGLOBIN: 9.5 g/dL — AB (ref 12.0–15.0)
Lymphocytes Relative: 14 %
Lymphs Abs: 1.5 10*3/uL (ref 0.7–4.0)
MCH: 25.7 pg — ABNORMAL LOW (ref 26.0–34.0)
MCHC: 31.1 g/dL (ref 30.0–36.0)
MCV: 82.7 fL (ref 78.0–100.0)
MONOS PCT: 10 %
Monocytes Absolute: 1.1 10*3/uL — ABNORMAL HIGH (ref 0.1–1.0)
NEUTROS PCT: 75 %
Neutro Abs: 8.1 10*3/uL — ABNORMAL HIGH (ref 1.7–7.7)
Platelets: 312 10*3/uL (ref 150–400)
RBC: 3.69 MIL/uL — ABNORMAL LOW (ref 3.87–5.11)
RDW: 13.8 % (ref 11.5–15.5)
WBC: 10.7 10*3/uL — ABNORMAL HIGH (ref 4.0–10.5)

## 2018-03-22 LAB — URINALYSIS, ROUTINE W REFLEX MICROSCOPIC
BILIRUBIN URINE: NEGATIVE
Glucose, UA: NEGATIVE mg/dL
Hgb urine dipstick: NEGATIVE
Ketones, ur: NEGATIVE mg/dL
Leukocytes, UA: NEGATIVE
NITRITE: NEGATIVE
PROTEIN: NEGATIVE mg/dL
Specific Gravity, Urine: <1.005 — ABNORMAL LOW (ref 1.005–1.030)
pH: 7 (ref 5.0–8.0)

## 2018-03-22 LAB — COMPREHENSIVE METABOLIC PANEL
ALK PHOS: 74 U/L (ref 38–126)
ALT: 23 U/L (ref 0–44)
AST: 22 U/L (ref 15–41)
Albumin: 2.9 g/dL — ABNORMAL LOW (ref 3.5–5.0)
Anion gap: 7 (ref 5–15)
BILIRUBIN TOTAL: 0.4 mg/dL (ref 0.3–1.2)
BUN: <5 mg/dL — ABNORMAL LOW (ref 6–20)
CALCIUM: 8.1 mg/dL — AB (ref 8.9–10.3)
CO2: 25 mmol/L (ref 22–32)
Chloride: 106 mmol/L (ref 98–111)
Creatinine, Ser: 0.46 mg/dL (ref 0.44–1.00)
GFR calc non Af Amer: >60 mL/min (ref >60)
Glucose, Bld: 84 mg/dL (ref 70–99)
Potassium: 3.5 mmol/L (ref 3.5–5.1)
SODIUM: 138 mmol/L (ref 135–145)
TOTAL PROTEIN: 6.6 g/dL (ref 6.5–8.1)

## 2018-03-22 MED ORDER — SODIUM CHLORIDE 0.9 % IV BOLUS
1000.0000 mL | Freq: Once | INTRAVENOUS | Status: AC
  Administered 2018-03-22: 1000 mL via INTRAVENOUS

## 2018-03-22 NOTE — ED Triage Notes (Signed)
Reports "lower abdominal pain".  States "I don't know if this is normal or not.  Denies N/V/D.  Reports she goes to ob/gyn upstairs but did not want to wait to go to them this morning.

## 2018-03-22 NOTE — ED Notes (Signed)
Patient c/o abdominal cramping.  EDP made aware.  Per EDP place patient on toco.  Patient moved to room 14.  EDP at bedside for bedside ultrasound.

## 2018-03-22 NOTE — ED Notes (Signed)
ED Provider at bedside. 

## 2018-03-22 NOTE — ED Provider Notes (Signed)
[facility] EMERGENCY DEPARTMENT Provider Note   CSN: [id number] Arrival date & time: 03/22/18  7829     History   Chief Complaint Chief Complaint  Patient presents with  . Abdominal Pain    HPI ABBYGAIL WILLHOITE is a 27 y.o. female.  The history is provided by the patient. No language interpreter was used.  Abdominal Pain     TAELA CHARBONNEAU is a 27 y.o. female who presents to the Emergency Department complaining of abdominal pain. She is between 22 weeks and 22 weeks and two days pregnant today and reports pelvic pain that began this morning. She has had two episodes of sharp lower abdominal/pelvic pain. Episodes last several minutes and then resolved. She has had similar but more mild episodes in the past. She denies any fevers, chest pain, shortness of breath, nausea, vomiting, leakage of fluids, vaginally bleeding, vaginally discharge. She has a history of two prior miscarriages and was concerned today and that is what she presented for evaluation. Her miscarriages were at 10 weeks as well as six weeks. She has no medical problems. Past Medical History:  Diagnosis Date  . Anxiety   . 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 Back pain   . Deficiency of potassium   . Depression   . HPV in female   . Hypokalemia   . Migraine     Patient Active Problem List   Diagnosis Date Noted  . Encounter for antenatal screening for malformations   . Depression affecting pregnancy, antepartum 01/30/2018  . 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 Rubella non-immune status, antepartum 12/06/2017  . Supervision of normal first pregnancy, antepartum 12/05/2017  . Anxiety and depression 03/16/2016  . Migraine 08/23/2014  . Insomnia 06/20/2014  . Dysmenorrhea 06/20/2014  . Palpitations 06/29/2013    Past Surgical History:  Procedure Laterality Date  . DILATION AND EVACUATION N/A 05/13/2017   Procedure: DILATATION AND EVACUATION;   Surgeon: Reva Bores, MD;  Location: [facility];  Service: Gynecology;  Laterality: N/A;     OB History    Gravida  3   Para      Term      Preterm      AB  2   Living  0     SAB  2   TAB      Ectopic      Multiple  0   Live Births               Home Medications    Prior to Admission medications   Medication Sig Start Date End Date Taking? Authorizing Provider  azithromycin (ZITHROMAX) 250 MG tablet Take 2 tablets by mouth on day 1, followed by 1 tablet by mouth daily for 4 days. Patient not taking: Reported on 02/27/2018 02/21/18   Saguier, Ramon Dredge, PA-C  Prenatal Vit-Fe Fumarate-FA (MULTIVITAMIN-PRENATAL) 27-0.8 MG TABS tablet Take 1 tablet by mouth daily at 12 noon.    [provider]  promethazine (PHENERGAN) 25 MG tablet Take 1 tablet (25 mg total) by mouth every 6 (six) hours as needed for nausea or vomiting. 12/14/17   Levie Heritage, DO  sertraline (ZOLOFT) 25 MG tablet Take 1 tablet (25 mg total) by mouth daily. Patient not taking: Reported on 02/27/2018 01/30/18   Willodean Rosenthal, MD  sertraline (ZOLOFT) 50 MG tablet Take 1 tablet (50 mg total) by mouth daily. After the 7 days of 25 mg daily Patient  not taking: Reported on 02/27/2018 01/30/18   Willodean RosenthalHarraway-Smith, Carolyn, MD    Family History Family History  Problem Relation Age of Onset  . COPD Mother   . Diabetes Father   . Cancer Neg Hx   . Hypertension Neg Hx   . Stroke Neg Hx     Social History Social History   Tobacco Use  . Smoking status: Never Smoker  . Smokeless tobacco: Never Used  Substance Use Topics  . Alcohol use: No  . Drug use: No     Allergies   Patient has no known allergies.   Review of Systems Review of Systems  Gastrointestinal: Positive for abdominal pain.  All other systems reviewed and are negative.    Physical Exam Updated Vital Signs BP 109/71   Pulse 98   Temp 98.2 F (36.8 C) (Oral)   Resp 16   LMP 10/10/2017 (Exact Date)   SpO2 99%    Physical Exam  Constitutional: She is oriented to person, place, and time. She appears well-developed and well-nourished.  HENT:  Head: Normocephalic and atraumatic.  Cardiovascular: Normal rate and regular rhythm.  No murmur heard. Pulmonary/Chest: Effort normal and breath sounds normal. No respiratory distress.  Abdominal: Soft. There is no rebound and no guarding.  Nontender abdomen. Gravid uterus to the level of the umbilicus.  Musculoskeletal: She exhibits no edema or tenderness.  Neurological: She is alert and oriented to person, place, and time.  Skin: Skin is warm and dry.  Psychiatric: She has a normal mood and affect. Her behavior is normal.  Nursing note and vitals reviewed.    ED Treatments / Results  Labs (all labs ordered are listed, but only abnormal results are displayed) Labs Reviewed  COMPREHENSIVE METABOLIC PANEL - Abnormal; Notable for the following components:      Result Value   BUN <5 (*)    Calcium 8.1 (*)    Albumin 2.9 (*)    All other components within normal limits  CBC WITH DIFFERENTIAL/PLATELET - Abnormal; Notable for the following components:   WBC 10.7 (*)    RBC 3.69 (*)    Hemoglobin 9.5 (*)    HCT 30.5 (*)    MCH 25.7 (*)    Neutro Abs 8.1 (*)    Monocytes Absolute 1.1 (*)    All other components within normal limits  URINALYSIS, ROUTINE W REFLEX MICROSCOPIC - Abnormal; Notable for the following components:   Specific Gravity, Urine <1.005 (*)    All other components within normal limits    EKG None  Radiology No results found.  Procedures Procedures (including critical care time) EMERGENCY DEPARTMENT US PREGNANCY "Study: Limited Ultrasound of the Pelvis for Pregnancy"  INDICATIONS:Pregnancy(required) and Abdominal or pelvic pain Multiple views of the uterus and pelvic cavity were obtained in real-time with a multi-frequency probe.  APPROACH:Transabdominal  PERFORMED BY: Myself IMAGES ARCHIVED?: Yes LIMITATIONS:  Decompressed bladder PREGNANCY FREE FLUID: None ADNEXAL FINDINGS:not performed GESTATIONAL AGE, ESTIMATE: not performed FETAL HEART RATE: 150 INTERPRETATION: Fetal heart activity seen     Medications Ordered in ED Medications  sodium chloride 0.9 % bolus 1,000 mL (0 mLs Intravenous Stopped 03/22/18 0944)     Initial Impression / Assessment and Plan / ED Course  I have reviewed the triage vital signs and the nursing notes.  Pertinent labs & imaging results that were available during my care of the patient were reviewed by me and considered in my medical decision making (see chart for details).    patient  [redacted] weeks pregnant here for evaluation of pelvic/lower abdominal pain intermittently. No evidence of active labor in the emergency department. She is feeling the baby move. UA not consistent with UTI. Bedside ultrasound demonstrates fetal cardiac activity. Current presentation is not consistent with active labor, acute appendicitis, serious bacterial infection. Discussed with patient home care, outpatient follow-up and return precautions.  Final Clinical Impressions(s) / ED Diagnoses   Final diagnoses:  Pelvic pain in pregnancy    ED Discharge Orders    None       Tilden Fossa, MD 03/22/18 1520

## 2018-03-22 NOTE — Progress Notes (Signed)
1005 Received call about this 27 yo G3P0 @ 22.[redacted] wks GA in with reports of 2 episodes of sharp pelvic pain.  No bleeding or LOF. Good fetal movement per bedside US performed by EDP.  Dopper FHR;s reported in the 150s.  Pt has now reported a 3rd episode of pelvic pain.1008 Dr. Earlene Plateravis notified of pt in ED and of above.  Orders for toco only monitoring x 30 min.  If no UC's noted then pt to be OB cleared and discharged home and follow up at the office as scheduled.

## 2018-03-22 NOTE — ED Notes (Signed)
Per OB rapid response patient has been cleared and has not had any contractions.  Will notify EDP.  Patient ambulatory to bathroom without difficulties.

## 2018-03-22 NOTE — ED Notes (Signed)
Pt placed on toco part of external fetal monitor.  Pt having small cramp but no rise noted on monitor.  Once cramp passed, Toco zero'd again,

## 2018-03-27 ENCOUNTER — Encounter: Payer: Self-pay | Admitting: Obstetrics & Gynecology

## 2018-03-27 ENCOUNTER — Ambulatory Visit (INDEPENDENT_AMBULATORY_CARE_PROVIDER_SITE_OTHER): Payer: Medicaid Other | Admitting: Obstetrics & Gynecology

## 2018-03-27 VITALS — BP 114/72 | HR 75 | Wt 115.0 lb

## 2018-03-27 DIAGNOSIS — Z283 Underimmunization status: Secondary | ICD-10-CM

## 2018-03-27 DIAGNOSIS — O9989 Other specified diseases and conditions complicating pregnancy, childbirth and the puerperium: Secondary | ICD-10-CM

## 2018-03-27 DIAGNOSIS — Z2839 Other underimmunization status: Secondary | ICD-10-CM

## 2018-03-27 DIAGNOSIS — Z3402 Encounter for supervision of normal first pregnancy, second trimester: Secondary | ICD-10-CM

## 2018-03-27 DIAGNOSIS — O99340 Other mental disorders complicating pregnancy, unspecified trimester: Secondary | ICD-10-CM

## 2018-03-27 DIAGNOSIS — F32A Depression, unspecified: Secondary | ICD-10-CM

## 2018-03-27 DIAGNOSIS — F329 Major depressive disorder, single episode, unspecified: Secondary | ICD-10-CM

## 2018-03-27 DIAGNOSIS — Z3400 Encounter for supervision of normal first pregnancy, unspecified trimester: Secondary | ICD-10-CM

## 2018-03-27 DIAGNOSIS — O99342 Other mental disorders complicating pregnancy, second trimester: Secondary | ICD-10-CM

## 2018-03-27 NOTE — Patient Instructions (Signed)

## 2018-03-27 NOTE — Progress Notes (Signed)
   PRENATAL VISIT NOTE  Subjective:  Tammy Mejia is a 27 y.o. G3P0020 at 107w5dbeing seen today for ongoing prenatal care.  She is currently monitored for the following issues for this low-risk pregnancy and has Palpitations; Insomnia; Dysmenorrhea; Migraine; Anxiety and depression; Supervision of normal first pregnancy, antepartum; Rubella non-immune status, antepartum; ASCUS with positive high risk HPV cervical; Depression affecting pregnancy, antepartum; and Encounter for antenatal screening for malformations on their problem list.  Patient reports no complaints.  Contractions: Not present. Vag. Bleeding: None.  Movement: Present. Denies leaking of fluid.   The following portions of the patient's history were reviewed and updated as appropriate: allergies, current medications, past family history, past medical history, past social history, past surgical history and problem list. Problem list updated.  Objective:   Vitals:   03/27/18 0818  BP: 114/72  Pulse: 75  Weight: 115 lb (52.2 kg)    Fetal Status:     Movement: Present     General:  Alert, oriented and cooperative. Patient is in no acute distress.  Skin: Skin is warm and dry. No rash noted.   Cardiovascular: Normal heart rate noted  Respiratory: Normal respiratory effort, no problems with respiration noted  Abdomen: Soft, gravid, appropriate for gestational age.  Pain/Pressure: Present     Pelvic: Cervical exam deferred        Extremities: Normal range of motion.  Edema: None  Mental Status: Normal mood and affect. Normal behavior. Normal judgment and thought content.   Assessment and Plan:  Pregnancy: [id number] 250w5d1. Supervision of normal first pregnancy, antepartum  2. Rubella non-immune status, antepartum Needs MMR PP  3. Depression affecting pregnancy, antepartum Stable  4. 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 Please refer to After Visit Summary for other counseling recommendations.  Return in about 1 month (around 04/24/2018).  No future appointments.  CaLavonia DraftsMD

## 2018-03-27 NOTE — Addendum Note (Signed)
Addended by: Anell BarrHOWARD, JENNIFER L on: 03/27/2018 11:42 AM   Modules accepted: Orders

## 2018-04-06 ENCOUNTER — Telehealth: Payer: Self-pay

## 2018-04-06 NOTE — Telephone Encounter (Signed)
Pt called the office stating that she hasn't felt her baby move for a few hours. Pt states that she ate a donut and had orange juice to drink for breakfast. Advised pt to go over to MAU and to grab a bite to eat while going to the emergency room. Pt voiced understanding.  Called pt back after lunch to see if baby moved. Pt states that baby started moving after she ate lunch.

## 2018-04-13 ENCOUNTER — Other Ambulatory Visit: Payer: Self-pay

## 2018-04-13 DIAGNOSIS — Z3400 Encounter for supervision of normal first pregnancy, unspecified trimester: Secondary | ICD-10-CM

## 2018-04-26 ENCOUNTER — Ambulatory Visit (INDEPENDENT_AMBULATORY_CARE_PROVIDER_SITE_OTHER): Payer: Medicaid Other | Admitting: Obstetrics & Gynecology

## 2018-04-26 VITALS — BP 98/64 | HR 87 | Wt 124.0 lb

## 2018-04-26 DIAGNOSIS — R87810 Cervical high risk human papillomavirus (HPV) DNA test positive: Secondary | ICD-10-CM

## 2018-04-26 DIAGNOSIS — N946 Dysmenorrhea, unspecified: Secondary | ICD-10-CM

## 2018-04-26 DIAGNOSIS — Z283 Underimmunization status: Secondary | ICD-10-CM

## 2018-04-26 DIAGNOSIS — F329 Major depressive disorder, single episode, unspecified: Secondary | ICD-10-CM

## 2018-04-26 DIAGNOSIS — O99340 Other mental disorders complicating pregnancy, unspecified trimester: Secondary | ICD-10-CM

## 2018-04-26 DIAGNOSIS — F32A Depression, unspecified: Secondary | ICD-10-CM

## 2018-04-26 DIAGNOSIS — Z2839 Other underimmunization status: Secondary | ICD-10-CM

## 2018-04-26 DIAGNOSIS — O9989 Other specified diseases and conditions complicating pregnancy, childbirth and the puerperium: Secondary | ICD-10-CM

## 2018-04-26 DIAGNOSIS — Z363 Encounter for antenatal screening for malformations: Secondary | ICD-10-CM

## 2018-04-26 DIAGNOSIS — Z3482 Encounter for supervision of other normal pregnancy, second trimester: Secondary | ICD-10-CM

## 2018-04-26 DIAGNOSIS — Z23 Encounter for immunization: Secondary | ICD-10-CM

## 2018-04-26 DIAGNOSIS — R87610 Atypical squamous cells of undetermined significance on cytologic smear of cervix (ASC-US): Secondary | ICD-10-CM

## 2018-04-26 DIAGNOSIS — O99342 Other mental disorders complicating pregnancy, second trimester: Secondary | ICD-10-CM

## 2018-04-26 DIAGNOSIS — Z3400 Encounter for supervision of normal first pregnancy, unspecified trimester: Secondary | ICD-10-CM

## 2018-04-26 NOTE — Progress Notes (Signed)
   PRENATAL VISIT NOTE  Subjective:  Tammy Mejia is a [age] y.o. G3P0020 at 7537w0d being seen today for ongoing prenatal care.  She is currently monitored for the following issues for this low-risk pregnancy and has Palpitations; Insomnia; Dysmenorrhea; Migraine; Anxiety and depression; Supervision of normal first pregnancy, antepartum; Rubella non-immune status, antepartum; ASCUS with positive high risk HPV cervical; Depression affecting pregnancy, antepartum; and Encounter for antenatal screening for malformations on their problem list.  Patient reports no complaints.  Contractions: Not present. Vag. Bleeding: None.  Movement: Present. Denies leaking of fluid.   The following portions of the patient's history were reviewed and updated as appropriate: allergies, current medications, past family history, past medical history, past social history, past surgical history and problem list. Problem list updated.  Objective:   Vitals:   04/26/18 1501  BP: 98/64  Pulse: 87  Weight: 124 lb (56.2 kg)    Fetal Status:     Movement: Present     General:  Alert, oriented and cooperative. Patient is in no acute distress.  Skin: Skin is warm and dry. No rash noted.   Cardiovascular: Normal heart rate noted  Respiratory: Normal respiratory effort, no problems with respiration noted  Abdomen: Soft, gravid, appropriate for gestational age.  Pain/Pressure: Present     Pelvic: Cervical exam deferred        Extremities: Normal range of motion.  Edema: None  Mental Status: Normal mood and affect. Normal behavior. Normal judgment and thought content.   Assessment and Plan:  Pregnancy: G3P0020 at 4737w0d  1. Supervision of normal first pregnancy, antepartum Pt did not come fasting for her 28 week labs. She will f/u next week for a lab only visit  2. Rubella non-immune status, antepartum Needs vaccine PP  3. Encounter for antenatal screening for malformations 03/03/2018 The implications of a  hypoplastic NB were discussed  in detail. We reviewed her quad screen results which showed  her DSR to be 1 in 10,000. After careful consideration, she  declined further testing.  4. Depression affecting pregnancy, antepartum stable  5. ASCUS with positive high risk HPV cervical Needs colpo PP  Preterm labor symptoms and general obstetric precautions including but not limited to vaginal bleeding, contractions, leaking of fluid and fetal movement were reviewed in detail with the patient. Please refer to After Visit Summary for other counseling recommendations.  Return in about 2 weeks (around 05/10/2018).  No future appointments.  Willodean Rosenthalarolyn Harraway-Smith, MD

## 2018-04-28 ENCOUNTER — Encounter (HOSPITAL_COMMUNITY): Payer: Self-pay | Admitting: *Deleted

## 2018-04-28 ENCOUNTER — Inpatient Hospital Stay (HOSPITAL_COMMUNITY)
Admission: AD | Admit: 2018-04-28 | Discharge: 2018-04-28 | Disposition: A | Discharge status: Home / Self Care | Payer: Medicaid Other | Source: Ambulatory Visit | Attending: Obstetrics & Gynecology | Admitting: Obstetrics & Gynecology

## 2018-04-28 DIAGNOSIS — O26892 Other specified pregnancy related conditions, second trimester: Secondary | ICD-10-CM | POA: Diagnosis not present

## 2018-04-28 DIAGNOSIS — O4703 False labor before 37 completed weeks of gestation, third trimester: Secondary | ICD-10-CM

## 2018-04-28 DIAGNOSIS — D649 Anemia, unspecified: Secondary | ICD-10-CM | POA: Insufficient documentation

## 2018-04-28 DIAGNOSIS — Z833 Family history of diabetes mellitus: Secondary | ICD-10-CM | POA: Insufficient documentation

## 2018-04-28 DIAGNOSIS — R109 Unspecified abdominal pain: Secondary | ICD-10-CM | POA: Diagnosis present

## 2018-04-28 DIAGNOSIS — O4702 False labor before 37 completed weeks of gestation, second trimester: Secondary | ICD-10-CM | POA: Insufficient documentation

## 2018-04-28 DIAGNOSIS — O99013 Anemia complicating pregnancy, third trimester: Secondary | ICD-10-CM

## 2018-04-28 DIAGNOSIS — O26893 Other specified pregnancy related conditions, third trimester: Secondary | ICD-10-CM | POA: Diagnosis not present

## 2018-04-28 DIAGNOSIS — Z3A27 27 weeks gestation of pregnancy: Secondary | ICD-10-CM | POA: Diagnosis not present

## 2018-04-28 DIAGNOSIS — Z825 Family history of asthma and other chronic lower respiratory diseases: Secondary | ICD-10-CM | POA: Insufficient documentation

## 2018-04-28 DIAGNOSIS — O99012 Anemia complicating pregnancy, second trimester: Secondary | ICD-10-CM | POA: Insufficient documentation

## 2018-04-28 LAB — URINALYSIS, ROUTINE W REFLEX MICROSCOPIC
Bilirubin Urine: NEGATIVE
Glucose, UA: NEGATIVE mg/dL
HGB URINE DIPSTICK: NEGATIVE
Ketones, ur: NEGATIVE mg/dL
Leukocytes, UA: NEGATIVE
NITRITE: NEGATIVE
PROTEIN: NEGATIVE mg/dL
SPECIFIC GRAVITY, URINE: 1.006 (ref 1.005–1.030)
pH: 7 (ref 5.0–8.0)

## 2018-04-28 LAB — CBC
HEMATOCRIT: 25.7 % — AB (ref 36.0–46.0)
Hemoglobin: 8.2 g/dL — ABNORMAL LOW (ref 12.0–15.0)
MCH: 25.4 pg — ABNORMAL LOW (ref 26.0–34.0)
MCHC: 31.9 g/dL (ref 30.0–36.0)
MCV: 79.6 fL (ref 78.0–100.0)
Platelets: 276 10*3/uL (ref 150–400)
RBC: 3.23 MIL/uL — ABNORMAL LOW (ref 3.87–5.11)
RDW: 13.7 % (ref 11.5–15.5)
WBC: 13.2 10*3/uL — AB (ref 4.0–10.5)

## 2018-04-28 LAB — COMPREHENSIVE METABOLIC PANEL
ALBUMIN: 2.8 g/dL — AB (ref 3.5–5.0)
ALT: 15 U/L (ref 0–44)
ANION GAP: 9 (ref 5–15)
AST: 19 U/L (ref 15–41)
Alkaline Phosphatase: 86 U/L (ref 38–126)
BILIRUBIN TOTAL: 0.5 mg/dL (ref 0.3–1.2)
BUN: <5 mg/dL — ABNORMAL LOW (ref 6–20)
CHLORIDE: 103 mmol/L (ref 98–111)
CO2: 23 mmol/L (ref 22–32)
Calcium: 8.4 mg/dL — ABNORMAL LOW (ref 8.9–10.3)
Creatinine, Ser: 0.42 mg/dL — ABNORMAL LOW (ref 0.44–1.00)
GFR calc Af Amer: >60 mL/min (ref >60)
GFR calc non Af Amer: >60 mL/min (ref >60)
GLUCOSE: 70 mg/dL (ref 70–99)
Potassium: 3.3 mmol/L — ABNORMAL LOW (ref 3.5–5.1)
Sodium: 135 mmol/L (ref 135–145)
TOTAL PROTEIN: 6.2 g/dL — AB (ref 6.5–8.1)

## 2018-04-28 LAB — FETAL FIBRONECTIN: FETAL FIBRONECTIN: NEGATIVE

## 2018-04-28 MED ORDER — ONDANSETRON 4 MG PO TBDP
4.0000 mg | ORAL_TABLET | Freq: Once | ORAL | Status: AC
  Administered 2018-04-28: 4 mg via ORAL
  Filled 2018-04-28: qty 1

## 2018-04-28 MED ORDER — ONDANSETRON 4 MG PO TBDP: 4.0000 mg | ORAL_TABLET | Freq: Four times a day (QID) | ORAL | 0 refills | Status: DC | PRN

## 2018-04-28 MED ORDER — INTEGRA 62.5-62.5-40-3 MG PO CAPS: 1.0000 | ORAL_CAPSULE | Freq: Every day | ORAL | 2 refills | Status: DC

## 2018-04-28 MED ORDER — NIFEDIPINE 10 MG PO CAPS
10.0000 mg | ORAL_CAPSULE | Freq: Once | ORAL | Status: AC
Start: 2018-04-28 — End: 2018-04-28
  Administered 2018-04-28: 10 mg via ORAL
  Filled 2018-04-28: qty 1

## 2018-04-28 MED ORDER — COMFORT FIT MATERNITY SUPP MED MISC: 1.0000 | Freq: Every day | 0 refills | Status: DC

## 2018-04-28 NOTE — MAU Note (Signed)
Pt C/O lower abd cramping & tightening since this morning.  Denies bleeding or LOF.  Reports good fetal movement.

## 2018-04-28 NOTE — MAU Provider Note (Signed)
Chief Complaint:  Abdominal Pain   First Provider Initiated Contact with Patient 04/28/18 1339      HPI: Tammy Mejia is a 27 y.o. G3P0020 at 2527w2dwho presents to maternity admissions reporting cramping abdominal pain that feels like tightening starting today. Before this pain, she was feeling run-down, nauseous, and with upset stomach and soft stool x 2 days, since her TDAP injection in the office.  There are no other associated symptoms.  She denies recent intercourse.  She is keeping down fluids, but has not been able to each much for a few days due to her symptoms.  She has not tried any treatments. The Phenergan she has at home makes her too drowsy. She reports good fetal movement, denies LOF, vaginal bleeding, vaginal itching/burning, urinary symptoms, h/a, dizziness, or fever/chills.    HPI  Past Medical History: Past Medical History:  Diagnosis Date  . Anxiety   . 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 Back pain   . Deficiency of potassium   . Depression   . HPV in female   . Hypokalemia   . Migraine     Past obstetric history: OB History  Gravida Para Term Preterm AB Living  3       2 0  SAB TAB Ectopic Multiple Live Births  2     0      # Outcome Date GA Lbr Len/2nd Weight Sex Delivery Anes PTL Lv  3 Current           2 SAB           1 SAB             Past Surgical History: Past Surgical History:  Procedure Laterality Date  . DILATION AND EVACUATION N/A 05/13/2017   Procedure: DILATATION AND EVACUATION;  Surgeon: Reva BoresPratt, Tanya S, MD;  Location: [facility];  Service: Gynecology;  Laterality: N/A;    Family History: Family History  Problem Relation Age of Onset  . COPD Mother   . Diabetes Father   . Cancer Neg Hx   . Hypertension Neg Hx   . Stroke Neg Hx     Social History: Social History   Tobacco Use  . Smoking status: Never Smoker  . Smokeless tobacco: Never Used  Substance Use Topics  . Alcohol use: No  . Drug use: No     Allergies: No Known Allergies  Meds:  No medications prior to admission.    ROS:  Review of Systems  Constitutional: Negative for chills, fatigue and fever.  Eyes: Negative for visual disturbance.  Respiratory: Negative for shortness of breath.   Cardiovascular: Negative for chest pain.  Gastrointestinal: Positive for abdominal pain and nausea. Negative for vomiting.  Genitourinary: Negative for difficulty urinating, dysuria, flank pain, pelvic pain, vaginal bleeding, vaginal discharge and vaginal pain.  Neurological: Negative for dizziness and headaches.  Psychiatric/Behavioral: Negative.      I have reviewed patient's Past Medical Hx, Surgical Hx, Family Hx, Social Hx, medications and allergies.   Physical Exam   Patient Vitals for the past 24 hrs:  BP Temp Temp src Pulse Resp SpO2 Height Weight  04/28/18 1737 100/64 98.4 F (36.9 C) Oral 92 18 100 % - -  04/28/18 1557 93/62 98.5 F (36.9 C) Oral 77 18 97 % - -  04/28/18 1249 111/69 98.2 F (36.8 C) Oral (!) 103 16 - 5\' 1"  (1.549 m) 56.2 kg   Constitutional: Well-developed, well-nourished female in no acute  distress.  Cardiovascular: normal rate Respiratory: normal effort GI: Abd soft, non-tender, gravid appropriate for gestational age.  MS: Extremities nontender, no edema, normal ROM Neurologic: Alert and oriented x 4.  GU: Neg CVAT.  FFN collected by blind swab   Dilation: Fingertip Effacement (%): Thick Cervical Position: Posterior Exam by:: L. Leftwich-Kirby, CNM  FHT:  Baseline 140, moderate variability, accelerations present, no decelerations Contractions: irritability, irregular, mild to palpation   Labs: Results for orders placed or performed during the hospital encounter of 04/28/18 (from the past 24 hour(s))  Urinalysis, Routine w reflex microscopic     Status: None   Collection Time: 04/28/18  1:15 PM  Result Value Ref Range   Color, Urine YELLOW YELLOW   APPearance CLEAR CLEAR   Specific  Gravity, Urine 1.006 1.005 - 1.030   pH 7.0 5.0 - 8.0   Glucose, UA NEGATIVE NEGATIVE mg/dL   Hgb urine dipstick NEGATIVE NEGATIVE   Bilirubin Urine NEGATIVE NEGATIVE   Ketones, ur NEGATIVE NEGATIVE mg/dL   Protein, ur NEGATIVE NEGATIVE mg/dL   Nitrite NEGATIVE NEGATIVE   Leukocytes, UA NEGATIVE NEGATIVE  Fetal fibronectin     Status: None   Collection Time: 04/28/18  1:57 PM  Result Value Ref Range   Fetal Fibronectin NEGATIVE NEGATIVE  CBC     Status: Abnormal   Collection Time: 04/28/18  2:13 PM  Result Value Ref Range   WBC 13.2 (H) 4.0 - 10.5 K/uL   RBC 3.23 (L) 3.87 - 5.11 MIL/uL   Hemoglobin 8.2 (L) 12.0 - 15.0 g/dL   HCT 04.5 (L) 40.9 - 81.1 %   MCV 79.6 78.0 - 100.0 fL   MCH 25.4 (L) 26.0 - 34.0 pg   MCHC 31.9 30.0 - 36.0 g/dL   RDW 91.4 78.2 - 95.6 %   Platelets 276 150 - 400 K/uL  Comprehensive metabolic panel     Status: Abnormal   Collection Time: 04/28/18  2:13 PM  Result Value Ref Range   Sodium 135 135 - 145 mmol/L   Potassium 3.3 (L) 3.5 - 5.1 mmol/L   Chloride 103 98 - 111 mmol/L   CO2 23 22 - 32 mmol/L   Glucose, Bld 70 70 - 99 mg/dL   BUN <5 (L) 6 - 20 mg/dL   Creatinine, Ser 2.13 (L) 0.44 - 1.00 mg/dL   Calcium 8.4 (L) 8.9 - 10.3 mg/dL   Total Protein 6.2 (L) 6.5 - 8.1 g/dL   Albumin 2.8 (L) 3.5 - 5.0 g/dL   AST 19 15 - 41 U/L   ALT 15 0 - 44 U/L   Alkaline Phosphatase 86 38 - 126 U/L   Total Bilirubin 0.5 0.3 - 1.2 mg/dL   GFR calc non Af Amer >60 >60 mL/min   GFR calc Af Amer >60 >60 mL/min   Anion gap 9 5 - 15   O/Positive/-- (03/18 1100)  Imaging:  No results found.  MAU Course/MDM: NST reviewed and reactive Irregular cramping on toco, mild to palpation, cervix FT but pt cramping improved in MAU then Procardia 10 mg PO x 1 given and cramping resolved.  FFN negative so no evidence of preterm labor. CBC with anemia so may be causing pt symptoms of nausea/malaise, appetite low.   Add Rx for Integra in addition to daily PNV Rx for Zofran 4  mg ODT Q 8 hours PRN F/U in [facility] office as scheduled PTL precautions reviewed Pt discharge with strict return precautions.  Today's evaluation included a work-up for  preterm labor which can be life-threatening for both mom and baby.  Assessment: 1. Anemia affecting pregnancy in third trimester   2. Abdominal pain in pregnancy, third trimester   3. Threatened preterm labor, third trimester     Plan: Discharge home Labor precautions and fetal kick counts Follow-up Information    [facility] Follow up.   Specialty:  Obstetrics and Gynecology Why:  As scheduled, return to MAU as needed for emergencies Contact information: [address] [place] [address] [phone]         Allergies as of 04/28/2018   No Known Allergies     Medication List    STOP taking these medications   azithromycin 250 MG tablet Commonly known as:  ZITHROMAX   promethazine 25 MG tablet Commonly known as:  PHENERGAN   sertraline 25 MG tablet Commonly known as:  ZOLOFT   sertraline 50 MG tablet Commonly known as:  ZOLOFT     TAKE these medications   COMFORT FIT MATERNITY SUPP MED Misc 1 Device by Does not apply route daily.   INTEGRA 62.5-62.5-40-3 MG Caps Take 1 capsule by mouth daily.   multivitamin-prenatal 27-0.8 MG Tabs tablet Take 1 tablet by mouth daily at 12 noon.   ondansetron 4 MG disintegrating tablet Commonly known as:  ZOFRAN-ODT Take 1 tablet (4 mg total) by mouth every 6 (six) hours as needed for nausea.       Sharen Counter Certified Nurse-Midwife 04/28/2018 6:38 PM

## 2018-05-01 ENCOUNTER — Other Ambulatory Visit: Payer: Medicaid Other

## 2018-05-01 DIAGNOSIS — Z3490 Encounter for supervision of normal pregnancy, unspecified, unspecified trimester: Secondary | ICD-10-CM

## 2018-05-01 NOTE — Progress Notes (Signed)
Patient sent to lab for 28 week labs. Patient also given message from MAU in regards to suggestion for her. Armandina StammerJennifer Howard RN

## 2018-05-02 LAB — CBC
HEMATOCRIT: 27.1 % — AB (ref 34.0–46.6)
Hemoglobin: 8.4 g/dL — ABNORMAL LOW (ref 11.1–15.9)
MCH: 24.8 pg — AB (ref 26.6–33.0)
MCHC: 31 g/dL — AB (ref 31.5–35.7)
MCV: 80 fL (ref 79–97)
PLATELETS: 335 10*3/uL (ref 150–450)
RBC: 3.39 x10E6/uL — AB (ref 3.77–5.28)
RDW: 13.9 % (ref 12.3–15.4)
WBC: 10.2 10*3/uL (ref 3.4–10.8)

## 2018-05-02 LAB — GLUCOSE TOLERANCE, 2 HOURS W/ 1HR
Glucose, 1 hour: 120 mg/dL (ref 65–179)
Glucose, 2 hour: 90 mg/dL (ref 65–152)
Glucose, Fasting: 79 mg/dL (ref 65–91)

## 2018-05-02 LAB — RPR: RPR Ser Ql: NONREACTIVE

## 2018-05-02 LAB — HIV ANTIBODY (ROUTINE TESTING W REFLEX): HIV SCREEN 4TH GENERATION: NONREACTIVE

## 2018-05-06 ENCOUNTER — Inpatient Hospital Stay (HOSPITAL_COMMUNITY)
Admission: AD | Admit: 2018-05-06 | Discharge: 2018-05-06 | Disposition: A | Discharge status: Home / Self Care | Payer: Medicaid Other | Source: Ambulatory Visit | Attending: Family Medicine | Admitting: Family Medicine

## 2018-05-06 ENCOUNTER — Encounter (HOSPITAL_COMMUNITY): Payer: Self-pay

## 2018-05-06 DIAGNOSIS — O26892 Other specified pregnancy related conditions, second trimester: Secondary | ICD-10-CM

## 2018-05-06 DIAGNOSIS — Z3A28 28 weeks gestation of pregnancy: Secondary | ICD-10-CM | POA: Insufficient documentation

## 2018-05-06 DIAGNOSIS — O26893 Other specified pregnancy related conditions, third trimester: Secondary | ICD-10-CM | POA: Diagnosis not present

## 2018-05-06 DIAGNOSIS — R109 Unspecified abdominal pain: Secondary | ICD-10-CM | POA: Diagnosis not present

## 2018-05-06 DIAGNOSIS — O99343 Other mental disorders complicating pregnancy, third trimester: Secondary | ICD-10-CM | POA: Insufficient documentation

## 2018-05-06 DIAGNOSIS — F329 Major depressive disorder, single episode, unspecified: Secondary | ICD-10-CM | POA: Insufficient documentation

## 2018-05-06 DIAGNOSIS — O99340 Other mental disorders complicating pregnancy, unspecified trimester: Secondary | ICD-10-CM

## 2018-05-06 LAB — URINALYSIS, ROUTINE W REFLEX MICROSCOPIC
BILIRUBIN URINE: NEGATIVE
GLUCOSE, UA: NEGATIVE mg/dL
HGB URINE DIPSTICK: NEGATIVE
Ketones, ur: NEGATIVE mg/dL
Leukocytes, UA: NEGATIVE
Nitrite: NEGATIVE
PH: 6 (ref 5.0–8.0)
Protein, ur: NEGATIVE mg/dL
Specific Gravity, Urine: 1.006 (ref 1.005–1.030)

## 2018-05-06 LAB — RAPID URINE DRUG SCREEN, HOSP PERFORMED
AMPHETAMINES: NOT DETECTED
BARBITURATES: NOT DETECTED
Benzodiazepines: NOT DETECTED
Cocaine: NOT DETECTED
Opiates: NOT DETECTED
TETRAHYDROCANNABINOL: NOT DETECTED

## 2018-05-06 MED ORDER — CYCLOBENZAPRINE HCL 10 MG PO TABS
10.0000 mg | ORAL_TABLET | Freq: Once | ORAL | Status: AC
  Administered 2018-05-06: 10 mg via ORAL
  Filled 2018-05-06: qty 1

## 2018-05-06 MED ORDER — CYCLOBENZAPRINE HCL 10 MG PO TABS: 10.0000 mg | ORAL_TABLET | Freq: Two times a day (BID) | ORAL | 1 refills | Status: DC | PRN

## 2018-05-06 NOTE — MAU Provider Note (Signed)
History   G3 P0-0-2-0 at 28 weeks and 3 days in with abdominal cramping and sharp shooting pains into the pelvis for over a week now.  She was seen last week in the MAU for the same complaint at that time she was a fingertip, thick, posterior and high.  She denies any vaginal bleeding or rupture membranes.  States good fetal movement.  CSN: [id number]  Arrival date & time 05/06/18  1257   None     Chief Complaint  Patient presents with  . Abdominal Cramping    HPI  Past Medical History:  Diagnosis Date  . Anxiety   . 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 Back pain   . Deficiency of potassium   . Depression   . HPV in female   . Hypokalemia   . Migraine     Past Surgical History:  Procedure Laterality Date  . DILATION AND EVACUATION N/A 05/13/2017   Procedure: DILATATION AND EVACUATION;  Surgeon: Reva BoresPratt, Tanya S, MD;  Location: [facility];  Service: Gynecology;  Laterality: N/A;    Family History  Problem Relation Age of Onset  . COPD Mother   . Diabetes Father   . Cancer Neg Hx   . Hypertension Neg Hx   . Stroke Neg Hx     Social History   Tobacco Use  . Smoking status: Never Smoker  . Smokeless tobacco: Never Used  Substance Use Topics  . Alcohol use: No  . Drug use: No    OB History    Gravida  3   Para      Term      Preterm      AB  2   Living  0     SAB  2   TAB      Ectopic      Multiple  0   Live Births              Review of Systems  Constitutional: Negative.   HENT: Negative.   Eyes: Negative.   Respiratory: Negative.   Cardiovascular: Negative.   Gastrointestinal: Positive for abdominal pain and nausea.  Endocrine: Negative.   Genitourinary: Negative.   Musculoskeletal: Negative.   Skin: Negative.   Allergic/Immunologic: Negative.   Neurological: Negative.   Hematological: Negative.   Psychiatric/Behavioral: Negative.     Allergies  Patient has no known allergies.  Home Medications    BP  107/67 (BP Location: Right Arm)   Pulse 97   Temp 98.2 F (36.8 C)   Resp 16   Wt 56.2 kg   LMP 10/10/2017 (Exact Date)   SpO2 97%   BMI 23.43 kg/m   Physical Exam  Constitutional: She is oriented to person, place, and time. She appears well-developed and well-nourished.  HENT:  Head: Normocephalic.  Neck: Normal range of motion.  Cardiovascular: Normal rate, regular rhythm, normal heart sounds and intact distal pulses.  Pulmonary/Chest: Effort normal and breath sounds normal.  Abdominal: Soft. Bowel sounds are normal.  Genitourinary: Vagina normal and uterus normal.  Musculoskeletal: Normal range of motion.  Neurological: She is alert and oriented to person, place, and time.  Skin: Skin is warm and dry.  Psychiatric: She has a normal mood and affect. Her behavior is normal. Judgment and thought content normal.    MAU Course  Procedures (including critical care time)  Labs Reviewed  URINALYSIS, ROUTINE W REFLEX MICROSCOPIC  RAPID URINE DRUG SCREEN, HOSP PERFORMED   No results  found.   1. Abdominal pain in pregnancy, second trimester   2. Depression affecting pregnancy, antepartum       MDM  Vital signs are stable, fetal heart rate 135 and 140 moderate variability.  No uterine contractions.  Vaginal exam fingertip finals to closed posterior thick and high.

## 2018-05-06 NOTE — MAU Note (Signed)
Pt still having some cramping and have gradually gotten worse. Pain 7/10. No bleeding or LOF +FM

## 2018-05-06 NOTE — Discharge Instructions (Signed)

## 2018-05-09 ENCOUNTER — Encounter: Payer: Self-pay | Admitting: Obstetrics & Gynecology

## 2018-05-09 DIAGNOSIS — O99019 Anemia complicating pregnancy, unspecified trimester: Secondary | ICD-10-CM | POA: Insufficient documentation

## 2018-05-10 ENCOUNTER — Ambulatory Visit (INDEPENDENT_AMBULATORY_CARE_PROVIDER_SITE_OTHER): Payer: Medicaid Other | Admitting: Obstetrics & Gynecology

## 2018-05-10 VITALS — BP 96/66 | HR 99 | Wt 127.0 lb

## 2018-05-10 DIAGNOSIS — O9989 Other specified diseases and conditions complicating pregnancy, childbirth and the puerperium: Secondary | ICD-10-CM

## 2018-05-10 DIAGNOSIS — F329 Major depressive disorder, single episode, unspecified: Secondary | ICD-10-CM

## 2018-05-10 DIAGNOSIS — O99019 Anemia complicating pregnancy, unspecified trimester: Secondary | ICD-10-CM

## 2018-05-10 DIAGNOSIS — O99340 Other mental disorders complicating pregnancy, unspecified trimester: Secondary | ICD-10-CM

## 2018-05-10 DIAGNOSIS — R87610 Atypical squamous cells of undetermined significance on cytologic smear of cervix (ASC-US): Secondary | ICD-10-CM

## 2018-05-10 DIAGNOSIS — Z3400 Encounter for supervision of normal first pregnancy, unspecified trimester: Secondary | ICD-10-CM

## 2018-05-10 DIAGNOSIS — Z283 Underimmunization status: Secondary | ICD-10-CM

## 2018-05-10 DIAGNOSIS — R87810 Cervical high risk human papillomavirus (HPV) DNA test positive: Secondary | ICD-10-CM

## 2018-05-10 DIAGNOSIS — O09899 Supervision of other high risk pregnancies, unspecified trimester: Secondary | ICD-10-CM

## 2018-05-10 DIAGNOSIS — F32A Depression, unspecified: Secondary | ICD-10-CM

## 2018-05-10 MED ORDER — INTEGRA 62.5-62.5-40-3 MG PO CAPS: 1.0000 | ORAL_CAPSULE | Freq: Two times a day (BID) | ORAL | 2 refills | Status: DC

## 2018-05-10 NOTE — Patient Instructions (Signed)

## 2018-05-10 NOTE — Progress Notes (Signed)
   PRENATAL VISIT NOTE  Subjective:  Tammy Mejia is a 27 y.o. G3P0020 at 1865w0d being seen today for ongoing prenatal care.  She is currently monitored for the following issues for this low-risk pregnancy and has Palpitations; Migraine; Anxiety and depression; Supervision of normal first pregnancy, antepartum; Rubella non-immune status, antepartum; ASCUS with positive high risk HPV cervical; Depression affecting pregnancy, antepartum; and Anemia, antepartum on their problem list.  Patient reports heart burn and acid reflux.  Contractions: Not present. Vag. Bleeding: None.  Movement: Present. Denies leaking of fluid.   The following portions of the patient's history were reviewed and updated as appropriate: allergies, current medications, past family history, past medical history, past social history, past surgical history and problem list. Problem list updated.  Objective:   Vitals:   05/10/18 1312  BP: 96/66  Pulse: 99  Weight: 127 lb (57.6 kg)    Fetal Status: Fetal Heart Rate (bpm): 137   Movement: Present     General:  Alert, oriented and cooperative. Patient is in no acute distress.  Skin: Skin is warm and dry. No rash noted.   Cardiovascular: Normal heart rate noted  Respiratory: Normal respiratory effort, no problems with respiration noted  Abdomen: Soft, gravid, appropriate for gestational age.  Pain/Pressure: Present     Pelvic: Cervical exam deferred        Extremities: Normal range of motion.  Edema: None  Mental Status: Normal mood and affect. Normal behavior. Normal judgment and thought content.   Assessment and Plan:  Pregnancy: G3P0020 at 3165w0d  1. Supervision of normal first pregnancy, antepartum  2. Rubella non-immune status, antepartum Needs vaccine PP  3. Depression affecting pregnancy, antepartum stable  4. ASCUS with positive high risk HPV cervical  5. Anemia, antepartum Increase Integra to bid  Preterm labor symptoms and general obstetric  precautions including but not limited to vaginal bleeding, contractions, leaking of fluid and fetal movement were reviewed in detail with the patient. Please refer to After Visit Summary for other counseling recommendations.  Return in about 2 weeks (around 05/24/2018).  No future appointments.  Willodean Rosenthalarolyn Harraway-Smith, MD

## 2018-05-19 ENCOUNTER — Ambulatory Visit: Payer: Medicaid Other | Admitting: Family Medicine

## 2018-05-19 ENCOUNTER — Encounter: Payer: Self-pay | Admitting: Family Medicine

## 2018-05-19 VITALS — BP 92/62 | HR 96 | Temp 98.1°F | Ht 61.0 in | Wt 129.0 lb

## 2018-05-19 DIAGNOSIS — J309 Allergic rhinitis, unspecified: Secondary | ICD-10-CM | POA: Diagnosis not present

## 2018-05-19 MED ORDER — LORATADINE 10 MG PO TABS: 10.0000 mg | ORAL_TABLET | Freq: Every day | ORAL | 0 refills | Status: DC

## 2018-05-19 NOTE — Patient Instructions (Addendum)
OK to use Debrox (peroxide) in the ear to loosen up wax. Also recommend using a bulb syringe (for removing boogers from baby's noses) to flush through warm water. Do not use Q-tips as this can impact wax further.  Claritin (loratadine) 10 mg/d can be helpful.   Vick's, air humidifiers, stay hydrated.  Let Tammy Mejia know if you need anything.

## 2018-05-19 NOTE — Progress Notes (Signed)
Chief Complaint  Patient presents with  . Cough  . Fatigue  . Dizziness    Tammy SimsAshley B Mejia here for URI complaints.  Duration: 1 week  Associated symptoms: sinus congestion, rhinorrhea, itchy watery eyes and cough Denies: sinus pain, ear pain, ear drainage, sore throat, wheezing, shortness of breath, myalgia and fevers Treatment to date: none 30 w pregnant Sick contacts: No  ROS:  Const: Denies fevers HEENT: As noted in HPI Lungs: No SOB  Past Medical History:  Diagnosis Date  . Anxiety   . 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 Back pain   . Deficiency of potassium   . Depression   . HPV in female   . Hypokalemia   . Migraine     BP 92/62 (BP Location: Right Arm, Patient Position: Sitting, Cuff Size: Normal)   Pulse 96   Temp 98.1 F (36.7 C) (Oral)   Ht 5\' 1"  (1.549 m)   Wt 129 lb (58.5 kg)   LMP 10/10/2017 (Exact Date)   SpO2 98%   BMI 24.37 kg/m  General: Awake, alert, appears stated age HEENT: AT, [place], ears patent b/l and TM's neg, nares patent w/o discharge, pharynx pink and without exudates, MMM Neck: No masses or asymmetry Heart: RRR Lungs: CTAB, no accessory muscle use Psych: Age appropriate judgment and insight, normal mood and affect  Allergic rhinitis, unspecified seasonality, unspecified trigger - Plan: loratadine (CLARITIN) 10 MG tablet  Orders as above. Continue to push fluids, practice good hand hygiene, cover mouth when coughing. F/u prn. If starting to experience fevers, shaking, or shortness of breath, seek immediate care. Pt voiced understanding and agreement to the plan.  Jilda Rocheicholas Paul SweetwaterWendling, DO 05/19/18 10:54 AM

## 2018-05-19 NOTE — Progress Notes (Signed)
Pre visit review using our clinic review tool, if applicable. No additional management support is needed unless otherwise documented below in the visit note. 

## 2018-05-24 ENCOUNTER — Ambulatory Visit (INDEPENDENT_AMBULATORY_CARE_PROVIDER_SITE_OTHER): Payer: Medicaid Other | Admitting: Obstetrics & Gynecology

## 2018-05-24 VITALS — BP 109/63 | HR 111 | Wt 130.0 lb

## 2018-05-24 DIAGNOSIS — Z3400 Encounter for supervision of normal first pregnancy, unspecified trimester: Secondary | ICD-10-CM

## 2018-05-24 NOTE — Progress Notes (Signed)
   PRENATAL VISIT NOTE  Subjective:  Tammy Mejia is a 27 y.o. G3P0020 at [redacted]w[redacted]d being seen today for ongoing prenatal care.  She is currently monitored for the following issues for this low-risk pregnancy and has Palpitations; Migraine; Anxiety and depression; Supervision of normal first pregnancy, antepartum; Rubella non-immune status, antepartum; ASCUS with positive high risk HPV cervical; Depression affecting pregnancy, antepartum; and Anemia, antepartum on their problem list.  Patient reports no complaints. Accompanied by her husband.  Contractions: Not present. Vag. Bleeding: None.  Movement: Present. Denies leaking of fluid.   The following portions of the patient's history were reviewed and updated as appropriate: allergies, current medications, past family history, past medical history, past social history, past surgical history and problem list. Problem list updated.  Objective:   Vitals:   05/24/18 1603  BP: 109/63  Pulse: (!) 111  Weight: 130 lb (59 kg)    Fetal Status: Fetal Heart Rate (bpm): 146 Fundal Height: 31 cm Movement: Present     General:  Alert, oriented and cooperative. Patient is in no acute distress.  Skin: Skin is warm and dry. No rash noted.   Cardiovascular: Normal heart rate noted  Respiratory: Normal respiratory effort, no problems with respiration noted  Abdomen: Soft, gravid, appropriate for gestational age.  Pain/Pressure: Absent     Pelvic: Cervical exam deferred        Extremities: Normal range of motion.  Edema: Trace  Mental Status: Normal mood and affect. Normal behavior. Normal judgment and thought content.   Assessment and Plan:  Pregnancy: G3P0020 at [redacted]w[redacted]d  1. Supervision of normal first pregnancy, antepartum No issues today. Preterm labor symptoms and general obstetric precautions including but not limited to vaginal bleeding, contractions, leaking of fluid and fetal movement were reviewed in detail with the patient. Please refer  to After Visit Summary for other counseling recommendations.  Return in about 2 weeks (around 06/07/2018) for OB Visit.  No future appointments.  Jaynie Collins, MD

## 2018-05-24 NOTE — Patient Instructions (Signed)
Return to clinic for any scheduled appointments or obstetric concerns, or go to [facility] for evaluation  Research childbirth classes and hospital preregistration at [email or web address]  Fetal Movement Counts Patient Name: ________________________________________________ Patient Due Date: ____________________ What is a fetal movement count? A fetal movement count is the number of times that you feel your baby move during a certain amount of time. This may also be called a fetal kick count. A fetal movement count is recommended for every pregnant woman. You may be asked to start counting fetal movements as early as week 28 of your pregnancy. Pay attention to when your baby is most active. You may notice your baby's sleep and wake cycles. You may also notice things that make your baby move more. You should do a fetal movement count:  When your baby is normally most active.  At the same time each day.  A good time to count movements is while you are resting, after having something to eat and drink. How do I count fetal movements? 1. Find a quiet, comfortable area. Sit, or lie down on your side. 2. Write down the date, the start time and stop time, and the number of movements that you felt between those two times. Take this information with you to your health care visits. 3. For 2 hours, count kicks, flutters, swishes, rolls, and jabs. You should feel at least 10 movements during 2 hours. 4. You may stop counting after you have felt 10 movements. 5. If you do not feel 10 movements in 2 hours, have something to eat and drink. Then, keep resting and counting for 1 hour. If you feel at least 4 movements during that hour, you may stop counting. Contact a health care provider if:  You feel fewer than 4 movements in 2 hours.  Your baby is not moving like he or she usually does. Date: ____________ Start time: ____________ Stop time: ____________ Movements: ____________ Date: ____________ Start time:  ____________ Stop time: ____________ Movements: ____________ Date: ____________ Start time: ____________ Stop time: ____________ Movements: ____________ Date: ____________ Start time: ____________ Stop time: ____________ Movements: ____________ Date: ____________ Start time: ____________ Stop time: ____________ Movements: ____________ Date: ____________ Start time: ____________ Stop time: ____________ Movements: ____________ Date: ____________ Start time: ____________ Stop time: ____________ Movements: ____________ Date: ____________ Start time: ____________ Stop time: ____________ Movements: ____________ Date: ____________ Start time: ____________ Stop time: ____________ Movements: ____________ This information is not intended to replace advice given to you by your health care provider. Make sure you discuss any questions you have with your health care provider. Document Released: 10/06/2006 Document Revised: 05/05/2016 Document Reviewed: 10/16/2015 Elsevier Interactive Patient Education  2018 [employer]  Braxton Hicks Contractions Contractions of the uterus can occur throughout pregnancy, but they are not always a sign that you are in labor. You may have practice contractions called Braxton Hicks contractions. These false labor contractions are sometimes confused with true labor. What are Braxton Hicks contractions? Braxton Hicks contractions are tightening movements that occur in the muscles of the uterus before labor. Unlike true labor contractions, these contractions do not result in opening (dilation) and thinning of the cervix. Toward the end of pregnancy (32-34 weeks), Braxton Hicks contractions can happen more often and may become stronger. These contractions are sometimes difficult to tell apart from true labor because they can be very uncomfortable. You should not feel embarrassed if you go to the hospital with false labor. Sometimes, the only way to tell if you are in true labor is    for your health care provider to look for changes in the cervix. The health care provider will do a physical exam and may monitor your contractions. If you are not in true labor, the exam should show that your cervix is not dilating and your water has not broken. If there are other health problems associated with your pregnancy, it is completely safe for you to be sent home with false labor. You may continue to have Braxton Hicks contractions until you go into true labor. How to tell the difference between true labor and false labor True labor  Contractions last 30-70 seconds.  Contractions become very regular.  Discomfort is usually felt in the top of the uterus, and it spreads to the lower abdomen and low back.  Contractions do not go away with walking.  Contractions usually become more intense and increase in frequency.  The cervix dilates and gets thinner. False labor  Contractions are usually shorter and not as strong as true labor contractions.  Contractions are usually irregular.  Contractions are often felt in the front of the lower abdomen and in the groin.  Contractions may go away when you walk around or change positions while lying down.  Contractions get weaker and are shorter-lasting as time goes on.  The cervix usually does not dilate or become thin. Follow these instructions at home:  Take over-the-counter and prescription medicines only as told by your health care provider.  Keep up with your usual exercises and follow other instructions from your health care provider.  Eat and drink lightly if you think you are going into labor.  If Braxton Hicks contractions are making you uncomfortable: ? Change your position from lying down or resting to walking, or change from walking to resting. ? Sit and rest in a tub of warm water. ? Drink enough fluid to keep your urine pale yellow. Dehydration may cause these contractions. ? Do slow and deep breathing several times  an hour.  Keep all follow-up prenatal visits as told by your health care provider. This is important. Contact a health care provider if:  You have a fever.  You have continuous pain in your abdomen. Get help right away if:  Your contractions become stronger, more regular, and closer together.  You have fluid leaking or gushing from your vagina.  You pass blood-tinged mucus (bloody show).  You have bleeding from your vagina.  You have low back pain that you never had before.  You feel your baby's head pushing down and causing pelvic pressure.  Your baby is not moving inside you as much as it used to. Summary  Contractions that occur before labor are called Braxton Hicks contractions, false labor, or practice contractions.  Braxton Hicks contractions are usually shorter, weaker, farther apart, and less regular than true labor contractions. True labor contractions usually become progressively stronger and regular and they become more frequent.  Manage discomfort from Braxton Hicks contractions by changing position, resting in a warm bath, drinking plenty of water, or practicing deep breathing. This information is not intended to replace advice given to you by your health care provider. Make sure you discuss any questions you have with your health care provider. Document Released: 01/20/2017 Document Revised: 01/20/2017 Document Reviewed: 01/20/2017 Elsevier Interactive Patient Education  2018 Elsevier Inc.    

## 2018-05-29 ENCOUNTER — Ambulatory Visit: Payer: Medicaid Other | Admitting: Family Medicine

## 2018-05-29 ENCOUNTER — Encounter: Payer: Self-pay | Admitting: Family Medicine

## 2018-05-29 ENCOUNTER — Other Ambulatory Visit: Payer: Self-pay | Admitting: Family Medicine

## 2018-05-29 VITALS — BP 121/82 | HR 96 | Temp 98.5°F | Resp 16 | Ht 61.0 in | Wt 128.4 lb

## 2018-05-29 DIAGNOSIS — L03211 Cellulitis of face: Secondary | ICD-10-CM

## 2018-05-29 MED ORDER — CEFTRIAXONE SODIUM 1 G IJ SOLR
1.0000 g | Freq: Once | INTRAMUSCULAR | Status: AC
  Administered 2018-05-29: 1 g via INTRAMUSCULAR

## 2018-05-29 MED ORDER — CEPHALEXIN 500 MG PO CAPS: 500.0000 mg | ORAL_CAPSULE | Freq: Four times a day (QID) | ORAL | 0 refills | Status: AC

## 2018-05-29 MED ORDER — CEPHALEXIN 500 MG PO CAPS: 500.0000 mg | ORAL_CAPSULE | Freq: Two times a day (BID) | ORAL | 0 refills | Status: DC

## 2018-05-29 MED ORDER — TRAMADOL HCL 50 MG PO TABS: 50.0000 mg | ORAL_TABLET | Freq: Four times a day (QID) | ORAL | 0 refills | Status: AC | PRN

## 2018-05-29 NOTE — Progress Notes (Signed)
Patient ID: CYANN ALLINDER, female   DOB: Jan 22, 1991, 27 y.o.   MRN: [id number]     Subjective:  I acted as a Neurosurgeon for Dr. Zola Button.  Apolonio Schneiders, CMA   Patient ID: JALEESA CONSTANTINO, female    DOB: [birth year], 27 y.o.   MRN: [id number]  Chief Complaint  Patient presents with  . Edema    right side nostril and lip  . Pain    right side nostril and lip    HPI  Patient is in today for swelling in right side of nostril and right side of lip.  She also has pain.  She is [redacted] weeks pregnant.  She states it started like a pimple and she tried to pop it then the pain and redness spread to upper lip.  Pt has put otc first aid cream on it.  No fever.  No other symptoms   Patient Care Team: Sharlene Dory, DO as PCP - General (Family Medicine)   Past Medical History:  Diagnosis Date  . Anxiety   . 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 Back pain   . Deficiency of potassium   . Depression   . HPV in female   . Hypokalemia   . Migraine     Past Surgical History:  Procedure Laterality Date  . DILATION AND EVACUATION N/A 05/13/2017   Procedure: DILATATION AND EVACUATION;  Surgeon: Reva Bores, MD;  Location: [facility];  Service: Gynecology;  Laterality: N/A;    Family History  Problem Relation Age of Onset  . COPD Mother   . Diabetes Father   . Cancer Neg Hx   . Hypertension Neg Hx   . Stroke Neg Hx     Social History   Socioeconomic History  . Marital status: Married    Spouse name: Not on file  . Number of children: Not on file  . Years of education: Not on file  . Highest education level: Not on file  Occupational History  . Not on file  Social Needs  . Financial resource strain: Not on file  . Food insecurity:    Worry: Not on file    Inability: Not on file  . Transportation needs:    Medical: Not on file    Non-medical: Not on file  Tobacco Use  . Smoking status: Never Smoker  . Smokeless tobacco: Never Used  Substance and  Sexual Activity  . Alcohol use: No  . Drug use: No  . Sexual activity: Yes    Birth control/protection: None  Lifestyle  . Physical activity:    Days per week: Not on file    Minutes per session: Not on file  . Stress: Not on file  Relationships  . Social connections:    Talks on phone: Not on file    Gets together: Not on file    Attends religious service: Not on file    Active member of club or organization: Not on file    Attends meetings of clubs or organizations: Not on file    Relationship status: Not on file  . Intimate partner violence:    Fear of current or ex partner: Not on file    Emotionally abused: Not on file    Physically abused: Not on file    Forced sexual activity: Not on file  Other Topics Concern  . Not on file  Social History Narrative  . Not on file    Outpatient Medications Prior  to Visit  Medication Sig Dispense Refill  . Elastic Bandages & Supports (COMFORT FIT MATERNITY SUPP MED) MISC 1 Device by Does not apply route daily. 1 each 0  . Fe Fum-FePoly-Vit C-Vit B3 (INTEGRA) 62.5-62.5-40-3 MG CAPS Take 1 capsule by mouth 2 (two) times daily. 60 capsule 2  . loratadine (CLARITIN) 10 MG tablet Take 1 tablet (10 mg total) by mouth daily. 30 tablet 0  . ondansetron (ZOFRAN ODT) 4 MG disintegrating tablet Take 1 tablet (4 mg total) by mouth every 6 (six) hours as needed for nausea. 20 tablet 0  . Prenatal Vit-Fe Fumarate-FA (MULTIVITAMIN-PRENATAL) 27-0.8 MG TABS tablet Take 1 tablet by mouth daily at 12 noon.     No facility-administered medications prior to visit.     No Known Allergies  Review of Systems  Constitutional: Negative for fever and malaise/fatigue.  HENT: Negative for congestion.   Eyes: Negative for blurred vision.  Respiratory: Negative for cough and shortness of breath.   Cardiovascular: Negative for chest pain, palpitations and leg swelling.  Gastrointestinal: Negative for vomiting.  Musculoskeletal: Negative for back pain.  Skin:  Negative for rash.  Neurological: Negative for loss of consciousness and headaches.       Objective:    Physical Exam  Constitutional: She is oriented to person, place, and time. She appears well-developed and well-nourished.  HENT:  Head: Normocephalic and atraumatic.  Nose: Mucosal edema and sinus tenderness present. Right sinus exhibits no maxillary sinus tenderness and no frontal sinus tenderness. Left sinus exhibits no maxillary sinus tenderness and no frontal sinus tenderness.    Eyes: Conjunctivae and EOM are normal.  Neck: Normal range of motion. Neck supple. No JVD present. Carotid bruit is not present. No thyromegaly present.  Cardiovascular: Normal rate, regular rhythm and normal heart sounds.  No murmur heard. Pulmonary/Chest: Effort normal and breath sounds normal. No respiratory distress. She has no wheezes. She has no rales. She exhibits no tenderness.  Musculoskeletal: She exhibits no edema.  Neurological: She is alert and oriented to person, place, and time.  Psychiatric: She has a normal mood and affect.  Nursing note and vitals reviewed.   BP 121/82 (BP Location: Left Arm, Cuff Size: Normal)   Pulse 96   Temp 98.5 F (36.9 C) (Oral)   Resp 16   Ht 5\' 1"  (1.549 m)   Wt 128 lb 6.4 oz (58.2 kg)   LMP 10/10/2017 (Exact Date)   SpO2 100%   BMI 24.26 kg/m  Wt Readings from Last 3 Encounters:  05/29/18 128 lb 6.4 oz (58.2 kg)  05/24/18 130 lb (59 kg)  05/19/18 129 lb (58.5 kg)   BP Readings from Last 3 Encounters:  05/29/18 121/82  05/24/18 109/63  05/19/18 92/62     Immunization History  Administered Date(s) Administered  . Tdap 09/20/2009, 04/26/2018    Health Maintenance  Topic Date Due  . INFLUENZA VACCINE  04/20/2018  . PAP SMEAR  12/05/2020  . TETANUS/TDAP  04/26/2028  . HIV Screening  Completed    Lab Results  Component Value Date   WBC 10.2 05/01/2018   HGB 8.4 (L) 05/01/2018   HCT 27.1 (L) 05/01/2018   PLT 335 05/01/2018   GLUCOSE  70 04/28/2018   ALT 15 04/28/2018   AST 19 04/28/2018   NA 135 04/28/2018   K 3.3 (L) 04/28/2018   CL 103 04/28/2018   CREATININE 0.42 (L) 04/28/2018   BUN <5 (L) 04/28/2018   CO2 23 04/28/2018   TSH 1.19  01/13/2016    Lab Results  Component Value Date   TSH 1.19 01/13/2016   Lab Results  Component Value Date   WBC 10.2 05/01/2018   HGB 8.4 (L) 05/01/2018   HCT 27.1 (L) 05/01/2018   MCV 80 05/01/2018   PLT 335 05/01/2018   Lab Results  Component Value Date   NA 135 04/28/2018   K 3.3 (L) 04/28/2018   CO2 23 04/28/2018   GLUCOSE 70 04/28/2018   BUN <5 (L) 04/28/2018   CREATININE 0.42 (L) 04/28/2018   BILITOT 0.5 04/28/2018   ALKPHOS 86 04/28/2018   AST 19 04/28/2018   ALT 15 04/28/2018   PROT 6.2 (L) 04/28/2018   ALBUMIN 2.8 (L) 04/28/2018   CALCIUM 8.4 (L) 04/28/2018   ANIONGAP 9 04/28/2018   GFR 111.49 01/10/2017   No results found for: CHOL No results found for: HDL No results found for: LDLCALC No results found for: TRIG No results found for: CHOLHDL No results found for: WUJW1X       Assessment & Plan:   Problem List Items Addressed This Visit    None    Visit Diagnoses    Cellulitis of face    -  Primary   Relevant Medications   cephALEXin (KEFLEX) 500 MG capsule    warm compresses Rocephin 1 gm IM ent if no better tomorrow --- pt to call F/u 1 week or sooner prn   I am having Jory Sims start on cephALEXin. I am also having her maintain her multivitamin-prenatal, ondansetron, COMFORT FIT MATERNITY SUPP MED, INTEGRA, and loratadine.  Meds ordered this encounter  Medications  . cephALEXin (KEFLEX) 500 MG capsule    Sig: Take 1 capsule (500 mg total) by mouth 2 (two) times daily.    Dispense:  20 capsule    Refill:  0    CMA served as scribe during this visit. History, Physical and Plan performed by medical provider. Documentation and orders reviewed and attested to.  Donato Schultz, DO

## 2018-05-29 NOTE — Progress Notes (Signed)
Patient has cellulitis of lip. Prescribed keflex 500mg  BID - due to pregnancy and increased GFR, will need to take more frequently: 500mg  QID. Very painful. Will give tramadol for pain.

## 2018-05-29 NOTE — Patient Instructions (Signed)

## 2018-06-06 ENCOUNTER — Inpatient Hospital Stay (HOSPITAL_COMMUNITY)
Admission: AD | Admit: 2018-06-06 | Discharge: 2018-06-06 | Disposition: A | Discharge status: Home / Self Care | Payer: Medicaid Other | Source: Ambulatory Visit | Attending: Obstetrics & Gynecology | Admitting: Obstetrics & Gynecology

## 2018-06-06 ENCOUNTER — Other Ambulatory Visit: Payer: Self-pay

## 2018-06-06 ENCOUNTER — Encounter (HOSPITAL_COMMUNITY): Payer: Self-pay | Admitting: *Deleted

## 2018-06-06 DIAGNOSIS — Z3A32 32 weeks gestation of pregnancy: Secondary | ICD-10-CM | POA: Diagnosis not present

## 2018-06-06 DIAGNOSIS — F419 Anxiety disorder, unspecified: Secondary | ICD-10-CM | POA: Insufficient documentation

## 2018-06-06 DIAGNOSIS — F329 Major depressive disorder, single episode, unspecified: Secondary | ICD-10-CM | POA: Insufficient documentation

## 2018-06-06 DIAGNOSIS — O99340 Other mental disorders complicating pregnancy, unspecified trimester: Secondary | ICD-10-CM

## 2018-06-06 DIAGNOSIS — F32A Depression, unspecified: Secondary | ICD-10-CM

## 2018-06-06 DIAGNOSIS — O4703 False labor before 37 completed weeks of gestation, third trimester: Secondary | ICD-10-CM | POA: Diagnosis present

## 2018-06-06 DIAGNOSIS — Z79899 Other long term (current) drug therapy: Secondary | ICD-10-CM | POA: Diagnosis not present

## 2018-06-06 DIAGNOSIS — O99343 Other mental disorders complicating pregnancy, third trimester: Secondary | ICD-10-CM | POA: Insufficient documentation

## 2018-06-06 DIAGNOSIS — O99019 Anemia complicating pregnancy, unspecified trimester: Secondary | ICD-10-CM

## 2018-06-06 HISTORY — DX: Female pelvic inflammatory disease, unspecified: N73.9

## 2018-06-06 HISTORY — DX: Anemia, unspecified: D64.9

## 2018-06-06 LAB — WET PREP, GENITAL
Clue Cells Wet Prep HPF POC: NONE SEEN
Sperm: NONE SEEN
Trich, Wet Prep: NONE SEEN
YEAST WET PREP: NONE SEEN

## 2018-06-06 LAB — URINALYSIS, ROUTINE W REFLEX MICROSCOPIC
BILIRUBIN URINE: NEGATIVE
Glucose, UA: NEGATIVE mg/dL
Ketones, ur: NEGATIVE mg/dL
Leukocytes, UA: NEGATIVE
NITRITE: NEGATIVE
Protein, ur: NEGATIVE mg/dL
SPECIFIC GRAVITY, URINE: 1.004 — AB (ref 1.005–1.030)
pH: 7 (ref 5.0–8.0)

## 2018-06-06 LAB — FETAL FIBRONECTIN: Fetal Fibronectin: NEGATIVE

## 2018-06-06 LAB — POCT FERN TEST: POCT FERN TEST: NEGATIVE

## 2018-06-06 MED ORDER — LACTATED RINGERS IV BOLUS
1000.0000 mL | Freq: Once | INTRAVENOUS | Status: AC
  Administered 2018-06-06: 1000 mL via INTRAVENOUS

## 2018-06-06 MED ORDER — NIFEDIPINE 10 MG PO CAPS
10.0000 mg | ORAL_CAPSULE | ORAL | Status: DC | PRN
  Administered 2018-06-06 (×3): 10 mg via ORAL
  Filled 2018-06-06 (×3): qty 1

## 2018-06-06 NOTE — [facility] Provider Note (Signed)
History    CSN: [id number] Arrival date and time: 06/06/18 1347 First Provider Initiated Contact with Patient 06/06/18 1416     Chief Complaint  Patient presents with  . Rupture of Membranes  . Contractions   HPI Tammy Mejia is a 27yo G3P0020 at [redacted]w[redacted]d who presents with contractions and concern for possible ROM. She states overnight she started to have intense pain. States this is worse than the pains she has been having for the past several weeks. States it is low in her abdomen. She also noticed some fluid around 0400 this morning. She said it was clear and enough that she changed her underwear. She then put a panty liner on and has noticed more discharge that was more mucous like. Hasn't noticed any more discharge or leakage of fluid since arriving here. Reports good fetal movement. Denies any vaginal bleeding.   OB History    Gravida  3   Para      Term      Preterm      AB  2   Living  0     SAB  2   TAB      Ectopic      Multiple  0   Live Births              Past Medical History:  Diagnosis Date  . Anemia   . Anxiety   . 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 Back pain   . Deficiency of potassium   . Depression   . HPV in female   . Hypokalemia   . Migraine   . PID (pelvic inflammatory disease)     Past Surgical History:  Procedure Laterality Date  . DILATION AND CURETTAGE OF UTERUS    . DILATION AND EVACUATION N/A 05/13/2017   Procedure: DILATATION AND EVACUATION;  Surgeon: Reva Bores, MD;  Location: [facility];  Service: Gynecology;  Laterality: N/A;    Family History  Problem Relation Age of Onset  . COPD Mother   . Diabetes Father   . Heart disease Father   . Cancer Neg Hx   . Hypertension Neg Hx   . Stroke Neg Hx     Social History   Tobacco Use  . Smoking status: Never Smoker  . Smokeless tobacco: Never Used  Substance Use Topics  . Alcohol use: No  . Drug use: No    Allergies: No Known Allergies  Medications  Prior to Admission  Medication Sig Dispense Refill Last Dose  . cephALEXin (KEFLEX) 500 MG capsule Take 1 capsule (500 mg total) by mouth 4 (four) times daily for 10 days. 20 capsule 0   . Elastic Bandages & Supports (COMFORT FIT MATERNITY SUPP MED) MISC 1 Device by Does not apply route daily. 1 each 0 Taking  . Fe Fum-FePoly-Vit C-Vit B3 (INTEGRA) 62.5-62.5-40-3 MG CAPS Take 1 capsule by mouth 2 (two) times daily. 60 capsule 2 Taking  . loratadine (CLARITIN) 10 MG tablet Take 1 tablet (10 mg total) by mouth daily. 30 tablet 0 Taking  . ondansetron (ZOFRAN ODT) 4 MG disintegrating tablet Take 1 tablet (4 mg total) by mouth every 6 (six) hours as needed for nausea. 20 tablet 0 Taking  . Prenatal Vit-Fe Fumarate-FA (MULTIVITAMIN-PRENATAL) 27-0.8 MG TABS tablet Take 1 tablet by mouth daily at 12 noon.   Taking    Review of Systems  Constitutional: Negative for activity change and appetite change.  Respiratory: Negative for shortness of breath.  Cardiovascular: Negative for leg swelling.  Gastrointestinal: Positive for abdominal pain.  Genitourinary: Positive for vaginal discharge. Negative for dysuria, vaginal bleeding and vaginal pain.  Musculoskeletal: Positive for back pain.  Neurological: Negative for light-headedness and headaches.  Psychiatric/Behavioral: Positive for sleep disturbance.   Physical Exam   Blood pressure 112/73, pulse (!) 102, temperature 98.3 F (36.8 C), temperature source Oral, resp. rate 18, weight 57.4 kg, last menstrual period 10/10/2017, SpO2 100 %, unknown if currently breastfeeding.  Physical Exam  Nursing note and vitals reviewed. Constitutional: She is oriented to person, place, and time. She appears well-developed and well-nourished. No distress.  Appears comfortable  HENT:  Head: Normocephalic and atraumatic.  Eyes: Conjunctivae and EOM are normal. No scleral icterus.  Respiratory: No respiratory distress.  GI: There is no tenderness.  gravid   Genitourinary: Vagina normal.  Genitourinary Comments: Physiologic appearing discharge, no pooling with valsalva  Musculoskeletal: She exhibits no edema.  Neurological: She is alert and oriented to person, place, and time.  Skin: Skin is warm and dry. No rash noted.  Psychiatric: She has a normal mood and affect. Her behavior is normal.  NST/CST: 140-150s  mod +a -d  irregular contractions but every 3-5 minutes at times  MAU Course  Procedures  MDM -- SVE 1/thick/high, cervix midposition  continuing to have contractions so will give IL LR bolus -- continuing to have regular contractions, will send fetal fibronectin and give procardia -- plan to recheck cervical exam after giving Procardia  -- 3 total dose of Procardia given with improvement in symptoms.  -- SVE unchanged. Consulted with Dr. Adrian BlackwaterStinson regarding plan of care. Recommended preterm labor precautions and discharge home.   Assessment and Plan  27yo G3P0020 at 6670w6d who presented with concerns for preterm labor and ROM. Low suspicion for ROM - pooling and ferning negative. Cervical exam unchanged after several hours. Fetal fibronectin negative. Wet prep negative, will call if GC positive. Decreased intensity and strength of contractions after bolus and procardia x 3 doses. Preterm labor precautions reviewed. Patient and partner in agreement with plan. All questions answered.   Tamera StandsLaurel S Satin Boal, DO 06/06/2018, 2:17 PM

## 2018-06-06 NOTE — MAU Note (Signed)
Urine sent to lab 

## 2018-06-06 NOTE — MAU Note (Signed)
Been having, pretty sure it's contractions.  Pains coming every 7-10 min.  Started during the night. No bleeding, ? Rom.  At 0400.  Had clear fluid when got up to use the restrooms, ? Still coming.

## 2018-06-07 ENCOUNTER — Ambulatory Visit (INDEPENDENT_AMBULATORY_CARE_PROVIDER_SITE_OTHER): Payer: Medicaid Other | Admitting: Obstetrics & Gynecology

## 2018-06-07 ENCOUNTER — Encounter: Payer: Self-pay | Admitting: Family Medicine

## 2018-06-07 VITALS — BP 107/74 | HR 98 | Wt 130.0 lb

## 2018-06-07 DIAGNOSIS — Z3400 Encounter for supervision of normal first pregnancy, unspecified trimester: Secondary | ICD-10-CM

## 2018-06-07 DIAGNOSIS — R87810 Cervical high risk human papillomavirus (HPV) DNA test positive: Secondary | ICD-10-CM

## 2018-06-07 DIAGNOSIS — F419 Anxiety disorder, unspecified: Secondary | ICD-10-CM

## 2018-06-07 DIAGNOSIS — Z283 Underimmunization status: Secondary | ICD-10-CM

## 2018-06-07 DIAGNOSIS — O09899 Supervision of other high risk pregnancies, unspecified trimester: Secondary | ICD-10-CM

## 2018-06-07 DIAGNOSIS — O9989 Other specified diseases and conditions complicating pregnancy, childbirth and the puerperium: Secondary | ICD-10-CM

## 2018-06-07 DIAGNOSIS — R002 Palpitations: Secondary | ICD-10-CM

## 2018-06-07 DIAGNOSIS — O99019 Anemia complicating pregnancy, unspecified trimester: Secondary | ICD-10-CM

## 2018-06-07 DIAGNOSIS — R87610 Atypical squamous cells of undetermined significance on cytologic smear of cervix (ASC-US): Secondary | ICD-10-CM

## 2018-06-07 DIAGNOSIS — O99340 Other mental disorders complicating pregnancy, unspecified trimester: Secondary | ICD-10-CM

## 2018-06-07 DIAGNOSIS — F329 Major depressive disorder, single episode, unspecified: Secondary | ICD-10-CM

## 2018-06-07 DIAGNOSIS — F32A Depression, unspecified: Secondary | ICD-10-CM

## 2018-06-07 LAB — GC/CHLAMYDIA PROBE AMP (~~LOC~~) NOT AT ARMC
CHLAMYDIA, DNA PROBE: NEGATIVE
Neisseria Gonorrhea: NEGATIVE

## 2018-06-07 NOTE — Patient Instructions (Signed)

## 2018-06-07 NOTE — Progress Notes (Signed)
  Pt was seen in MAU 06/06/18 for contractions.  Scheryl Martenhristine Soliz RN

## 2018-06-07 NOTE — Progress Notes (Signed)
   PRENATAL VISIT NOTE  Subjective:  Tammy Mejia is a 27 y.o. G3P0020 at [redacted]w[redacted]d being seen today for ongoing prenatal care.  She is currently monitored for the following issues for this high-risk pregnancy and has Palpitations; Migraine; Anxiety and depression; Supervision of normal first pregnancy, antepartum; Rubella non-immune status, antepartum; ASCUS with positive high risk HPV cervical; Depression affecting pregnancy, antepartum; and Anemia, antepartum on their problem list.  Patient reports freq contractions; seen on L&D last pm.  Contractions: Irregular. Vag. Bleeding: None.  Movement: Present. Denies leaking of fluid.   The following portions of the patient's history were reviewed and updated as appropriate: allergies, current medications, past family history, past medical history, past social history, past surgical history and problem list. Problem list updated.  Objective:   Vitals:   06/07/18 1511  BP: 107/74  Pulse: 98  Weight: 130 lb (59 kg)    Fetal Status: Fetal Heart Rate (bpm): 145   Movement: Present     General:  Alert, oriented and cooperative. Patient is in no acute distress.  Skin: Skin is warm and dry. No rash noted.   Cardiovascular: Normal heart rate noted  Respiratory: Normal respiratory effort, no problems with respiration noted  Abdomen: Soft, gravid, appropriate for gestational age.  Pain/Pressure: Present     Pelvic: Cervical exam deferred        Extremities: Normal range of motion.     Mental Status: Normal mood and affect. Normal behavior. Normal judgment and thought content.   Assessment and Plan:  Pregnancy: G3P0020 at [redacted]w[redacted]d  1. Supervision of normal first pregnancy, antepartum  2. Rubella non-immune status, antepartum Needs MMR PP  3. Palpitations None currently  4. Depression affecting pregnancy, antepartum  5. ASCUS with positive high risk HPV cervical Needs colpo PP  6. Anxiety and depression  7. Anemia, antepartum Prescribed  FeSO4  Preterm labor symptoms and general obstetric precautions including but not limited to vaginal bleeding, contractions, leaking of fluid and fetal movement were reviewed in detail with the patient. Please refer to After Visit Summary for other counseling recommendations.  Return in about 2 weeks (around 06/21/2018).  No future appointments.   Harraway-Smith, MD 

## 2018-06-08 LAB — CULTURE, BETA STREP (GROUP B ONLY)

## 2018-06-22 ENCOUNTER — Ambulatory Visit (INDEPENDENT_AMBULATORY_CARE_PROVIDER_SITE_OTHER): Payer: Medicaid Other | Admitting: Family Medicine

## 2018-06-22 VITALS — BP 119/75 | HR 128 | Wt 131.9 lb

## 2018-06-22 DIAGNOSIS — R87810 Cervical high risk human papillomavirus (HPV) DNA test positive: Secondary | ICD-10-CM

## 2018-06-22 DIAGNOSIS — O9989 Other specified diseases and conditions complicating pregnancy, childbirth and the puerperium: Secondary | ICD-10-CM

## 2018-06-22 DIAGNOSIS — F32A Other mental disorders complicating pregnancy, unspecified trimester: Secondary | ICD-10-CM

## 2018-06-22 DIAGNOSIS — M549 Dorsalgia, unspecified: Secondary | ICD-10-CM

## 2018-06-22 DIAGNOSIS — M9903 Segmental and somatic dysfunction of lumbar region: Secondary | ICD-10-CM | POA: Diagnosis not present

## 2018-06-22 DIAGNOSIS — O26893 Other specified pregnancy related conditions, third trimester: Secondary | ICD-10-CM

## 2018-06-22 DIAGNOSIS — O99891 Other specified diseases and conditions complicating pregnancy: Secondary | ICD-10-CM

## 2018-06-22 DIAGNOSIS — O99343 Other mental disorders complicating pregnancy, third trimester: Secondary | ICD-10-CM

## 2018-06-22 DIAGNOSIS — O99340 Other mental disorders complicating pregnancy, unspecified trimester: Secondary | ICD-10-CM

## 2018-06-22 DIAGNOSIS — O09899 Supervision of other high risk pregnancies, unspecified trimester: Secondary | ICD-10-CM

## 2018-06-22 DIAGNOSIS — F329 Major depressive disorder, single episode, unspecified: Secondary | ICD-10-CM

## 2018-06-22 DIAGNOSIS — Z283 Underimmunization status: Secondary | ICD-10-CM

## 2018-06-22 DIAGNOSIS — Z3483 Encounter for supervision of other normal pregnancy, third trimester: Secondary | ICD-10-CM

## 2018-06-22 DIAGNOSIS — R87610 Atypical squamous cells of undetermined significance on cytologic smear of cervix (ASC-US): Secondary | ICD-10-CM

## 2018-06-22 DIAGNOSIS — Z3400 Encounter for supervision of normal first pregnancy, unspecified trimester: Secondary | ICD-10-CM

## 2018-06-22 NOTE — Progress Notes (Signed)
   PRENATAL VISIT NOTE  Subjective:  Tammy Mejia is a 27 y.o. G3P0020 at [redacted]w[redacted]d being seen today for ongoing prenatal care.  She is currently monitored for the following issues for this low-risk pregnancy and has Palpitations; Migraine; Anxiety and depression; Supervision of normal first pregnancy, antepartum; Rubella non-immune status, antepartum; ASCUS with positive high risk HPV cervical; Depression affecting pregnancy, antepartum; and Anemia, antepartum on their problem list.  Patient reports backache. Severe, worse over past couple of days, worse with movements.  Contractions: Irregular. Vag. Bleeding: None.  Movement: Present. Denies leaking of fluid.   The following portions of the patient's history were reviewed and updated as appropriate: allergies, current medications, past family history, past medical history, past social history, past surgical history and problem list. Problem list updated.  Objective:   Vitals:   06/22/18 1545  BP: 119/75  Pulse: (!) 128  Weight: 131 lb 14.4 oz (59.8 kg)    Fetal Status: Fetal Heart Rate (bpm): 138 Fundal Height: 35 cm Movement: Present     General:  Alert, oriented and cooperative. Patient is in no acute distress.  Skin: Skin is warm and dry. No rash noted.   Cardiovascular: Normal heart rate noted  Respiratory: Normal respiratory effort, no problems with respiration noted  Abdomen: Soft, gravid, appropriate for gestational age. Pain/Pressure: Present     Pelvic:  Cervical exam deferred        MSK: Restriction, tenderness, tissue texture changes, and paraspinal spasm in the lumbar spine  Neuro: Moves all four extremities with no focal neurological deficit  Extremities: Normal range of motion.  Edema: Trace  Mental Status: Normal mood and affect. Normal behavior. Normal judgment and thought content.   OSE: Head   Cervical   Thoracic   Rib   Lumbar L5 ESRL  Sacrum L/L  Pelvis Right ant innom    Assessment and Plan:    Pregnancy: G3P0020 at [redacted]w[redacted]d  1. Supervision of normal first pregnancy, antepartum FHT and FH noraml  2. Rubella non-immune status, antepartum Rubella postpartum  3. Depression affecting pregnancy, antepartum  4. ASCUS with positive high risk HPV cervical  5. Back pain affecting pregnancy in third trimester 6. Somatic dysfunction of spine, lumbar OMT done after patient permission. HVLA technique utilized. 3 areas treated with improvement of tissue texture and joint mobility. Patient tolerated procedure well.    Preterm labor symptoms and general obstetric precautions including but not limited to vaginal bleeding, contractions, leaking of fluid and fetal movement were reviewed in detail with the patient. Please refer to After Visit Summary for other counseling recommendations.  Return in about 1 week (around 06/29/2018) for OB f/u.  Levie Heritage, DO

## 2018-06-28 ENCOUNTER — Encounter: Payer: Self-pay | Admitting: Obstetrics & Gynecology

## 2018-06-28 ENCOUNTER — Ambulatory Visit (INDEPENDENT_AMBULATORY_CARE_PROVIDER_SITE_OTHER): Payer: Medicaid Other | Admitting: Obstetrics & Gynecology

## 2018-06-28 VITALS — BP 115/74 | HR 103 | Wt 129.0 lb

## 2018-06-28 DIAGNOSIS — O99013 Anemia complicating pregnancy, third trimester: Secondary | ICD-10-CM

## 2018-06-28 DIAGNOSIS — O99343 Other mental disorders complicating pregnancy, third trimester: Secondary | ICD-10-CM

## 2018-06-28 DIAGNOSIS — O9989 Other specified diseases and conditions complicating pregnancy, childbirth and the puerperium: Secondary | ICD-10-CM

## 2018-06-28 DIAGNOSIS — O99019 Anemia complicating pregnancy, unspecified trimester: Secondary | ICD-10-CM

## 2018-06-28 DIAGNOSIS — O99340 Other mental disorders complicating pregnancy, unspecified trimester: Secondary | ICD-10-CM

## 2018-06-28 DIAGNOSIS — Z3400 Encounter for supervision of normal first pregnancy, unspecified trimester: Secondary | ICD-10-CM

## 2018-06-28 DIAGNOSIS — R87610 Atypical squamous cells of undetermined significance on cytologic smear of cervix (ASC-US): Secondary | ICD-10-CM

## 2018-06-28 DIAGNOSIS — O09899 Supervision of other high risk pregnancies, unspecified trimester: Secondary | ICD-10-CM

## 2018-06-28 DIAGNOSIS — F329 Major depressive disorder, single episode, unspecified: Secondary | ICD-10-CM

## 2018-06-28 DIAGNOSIS — F32A Depression, unspecified: Secondary | ICD-10-CM

## 2018-06-28 DIAGNOSIS — Z3403 Encounter for supervision of normal first pregnancy, third trimester: Secondary | ICD-10-CM

## 2018-06-28 DIAGNOSIS — Z283 Underimmunization status: Secondary | ICD-10-CM

## 2018-06-28 DIAGNOSIS — R87810 Cervical high risk human papillomavirus (HPV) DNA test positive: Secondary | ICD-10-CM

## 2018-06-28 DIAGNOSIS — F419 Anxiety disorder, unspecified: Secondary | ICD-10-CM

## 2018-06-28 NOTE — Progress Notes (Signed)
   PRENATAL VISIT NOTE  Subjective:  Tammy Mejia is a 27 y.o. G3P0020 at 44w0dbeing seen today for ongoing prenatal care.  She is currently monitored for the following issues for this low-risk pregnancy and has Palpitations; Migraine; Anxiety and depression; Supervision of normal first pregnancy, antepartum; Rubella non-immune status, antepartum; ASCUS with positive high risk HPV cervical; Depression affecting pregnancy, antepartum; and Anemia, antepartum on their problem list.  Patient reports irreg contractions.  Contractions: Irregular. Vag. Bleeding: None.  Movement: Present. Denies leaking of fluid.   The following portions of the patient's history were reviewed and updated as appropriate: allergies, current medications, past family history, past medical history, past social history, past surgical history and problem list. Problem list updated.  Objective:   Vitals:   06/28/18 1508  BP: 115/74  Pulse: (!) 103  Weight: 129 lb (58.5 kg)    Fetal Status: Fetal Heart Rate (bpm): 155   Movement: Present     General:  Alert, oriented and cooperative. Patient is in no acute distress.  Skin: Skin is warm and dry. No rash noted.   Cardiovascular: Normal heart rate noted  Respiratory: Normal respiratory effort, no problems with respiration noted  Abdomen: Soft, gravid, appropriate for gestational age.  Pain/Pressure: Present     Pelvic: Cervical exam deferred        Extremities: Normal range of motion.  Edema: Trace  Mental Status: Normal mood and affect. Normal behavior. Normal judgment and thought content.   Assessment and Plan:  Pregnancy: G3P0020 at 362w0d1. Supervision of normal first pregnancy, antepartum  2. Rubella non-immune status, antepartum Needs MMR PP  3. Depression affecting pregnancy, antepartum Pt decided to hold off on Zoloft until PP 4. Anemia, antepartum  5. Anxiety and depression See above  6. ASCUS with positive high risk HPV cervical Repeat PAP or  colpo PP  Preterm labor symptoms and general obstetric precautions including but not limited to vaginal bleeding, contractions, leaking of fluid and fetal movement were reviewed in detail with the patient. Please refer to After Visit Summary for other counseling recommendations.  Return in about 1 week (around 07/05/2018).  No future appointments.  CaLavonia DraftsMD

## 2018-06-28 NOTE — Patient Instructions (Signed)
Natural Childbirth Natural childbirth is going through labor and delivery without any drugs to relieve pain. Additionally, fetal monitors are not used, a delivery is not done, and a surgical cut to enlarge the vaginal opening (episiotomy) is not made. With the help of a birthing professional (midwife or health care provider), you direct your own labor and delivery. Many women choose natural childbirth because it makes them feel more in control and in touch with their labor and delivery. Some woman also choose natural childbirth because they are concerned about medicines affecting them and their babies. Pregnant women with a high-risk pregnancy should not attempt natural childbirth. It is better to deliver the infant in a hospital if an emergency situation arises. Sometimes, a health care provider has to get involved for the health and safety of the mother and infant. Techniques for natural childbirth  The Lamaze method-This method teaches parents that having a baby is normal, healthy, and natural. It also teaches the mother to take a neutral position regarding pain medicine and numbing medicines and to make an informed decision about using these medicines when the time comes.  The Bradley method (also called husband-coached birth)-This method teaches the father or partner to be the birth coach. It encourages the mother to exercise and eat a balanced, nutritious diet. It also involves relaxation and deep breathing exercises and preparing the parents for emergency situations. Methods of dealing with labor pain and delivery  Meditation.  Yoga.  Hypnosis.  Acupuncture.  Massage.  Changing positions (walking, rocking, showering, leaning on birth balls).  Lying in warm water or a whirlpool bath.  Finding an activity that keeps your mind off of the labor pain.  Listening to soft music.  Focusing on a particular object (visual imagery). Before going into labor  Be sure you and your spouse or  partner are in agreement about having a natural childbirth.  Decide if your health care provider or a midwife will deliver your baby.  Decide if you will have your baby in the hospital, at a birthing center, or at home.  If you have children, make plans to have someone take care of them when you go to the hospital or birthing center.  Know the distance and the time it takes to go to the hospital or birthing center. Practice going there and time it to be sure.  Have a bag packed with a nightgown, bathrobe, and toiletries. Be ready to take it with you when you go into labor.  Keep phone numbers of your family and friends handy if you need to call someone when you go into labor.  Your spouse or partner should go to all the natural childbirth technique classes.  Talk with your health care provider about the possibility of a medical emergency and what will happen if that occurs. Advantages of natural childbirth  You are in control of your labor and delivery.  You will not take medicines that could affect you and the baby.  There are no invasive procedures, such as an episiotomy.  You and your spouse or partner will work together, which can increase your bond with each other.  In most delivery centers, your family and friends can be involved in the labor and delivery process. Disadvantages of natural childbirth  You will experience pain during your labor and delivery.  The methods of helping relieve your labor pains may not work for you.  You may feel disappointed if you decide to change your mind during labor and not   have a natural childbirth. After the delivery  You will be very tired.  You will be uncomfortable because of your uterus contracting. You will feel soreness around the vagina.  You may feel cold and shaky. This is a natural reaction. This information is not intended to replace advice given to you by your health care provider. Make sure you discuss any questions you  have with your health care provider. Document Released: 08/19/2008 Document Revised: 02/12/2016 Document Reviewed: 05/14/2013 Elsevier Interactive Patient Education  2017 Else[employer] Inc[employer]  

## 2018-06-29 ENCOUNTER — Telehealth: Payer: Self-pay

## 2018-06-29 ENCOUNTER — Inpatient Hospital Stay (HOSPITAL_COMMUNITY)
Admission: AD | Admit: 2018-06-29 | Discharge: 2018-06-29 | Disposition: A | Discharge status: Home / Self Care | Payer: Medicaid Other | Source: Ambulatory Visit | Attending: Obstetrics and Gynecology | Admitting: Obstetrics and Gynecology

## 2018-06-29 ENCOUNTER — Encounter (HOSPITAL_COMMUNITY): Payer: Self-pay

## 2018-06-29 DIAGNOSIS — Z3A36 36 weeks gestation of pregnancy: Secondary | ICD-10-CM | POA: Insufficient documentation

## 2018-06-29 DIAGNOSIS — O26893 Other specified pregnancy related conditions, third trimester: Secondary | ICD-10-CM

## 2018-06-29 DIAGNOSIS — D649 Anemia, unspecified: Secondary | ICD-10-CM | POA: Diagnosis not present

## 2018-06-29 DIAGNOSIS — N898 Other specified noninflammatory disorders of vagina: Secondary | ICD-10-CM

## 2018-06-29 DIAGNOSIS — O99013 Anemia complicating pregnancy, third trimester: Secondary | ICD-10-CM | POA: Insufficient documentation

## 2018-06-29 DIAGNOSIS — O99340 Other mental disorders complicating pregnancy, unspecified trimester: Secondary | ICD-10-CM

## 2018-06-29 DIAGNOSIS — Z3689 Encounter for other specified antenatal screening: Secondary | ICD-10-CM

## 2018-06-29 DIAGNOSIS — F329 Major depressive disorder, single episode, unspecified: Secondary | ICD-10-CM | POA: Insufficient documentation

## 2018-06-29 DIAGNOSIS — O99343 Other mental disorders complicating pregnancy, third trimester: Secondary | ICD-10-CM | POA: Diagnosis not present

## 2018-06-29 DIAGNOSIS — F32A Depression, unspecified: Secondary | ICD-10-CM

## 2018-06-29 DIAGNOSIS — O99019 Anemia complicating pregnancy, unspecified trimester: Secondary | ICD-10-CM

## 2018-06-29 LAB — POCT FERN TEST: POCT Fern Test: NEGATIVE

## 2018-06-29 NOTE — Discharge Instructions (Signed)
Braxton Hicks Contractions °Contractions of the uterus can occur throughout pregnancy, but they are not always a sign that you are in labor. You may have practice contractions called Braxton Hicks contractions. These false labor contractions are sometimes confused with true labor. °What are Braxton Hicks contractions? °Braxton Hicks contractions are tightening movements that occur in the muscles of the uterus before labor. Unlike true labor contractions, these contractions do not result in opening (dilation) and thinning of the cervix. Toward the end of pregnancy (32-34 weeks), Braxton Hicks contractions can happen more often and may become stronger. These contractions are sometimes difficult to tell apart from true labor because they can be very uncomfortable. You should not feel embarrassed if you go to the hospital with false labor. °Sometimes, the only way to tell if you are in true labor is for your health care provider to look for changes in the cervix. The health care provider will do a physical exam and may monitor your contractions. If you are not in true labor, the exam should show that your cervix is not dilating and your water has not broken. °If there are other health problems associated with your pregnancy, it is completely safe for you to be sent home with false labor. You may continue to have Braxton Hicks contractions until you go into true labor. °How to tell the difference between true labor and false labor °True labor °· Contractions last 30-70 seconds. °· Contractions become very regular. °· Discomfort is usually felt in the top of the uterus, and it spreads to the lower abdomen and low back. °· Contractions do not go away with walking. °· Contractions usually become more intense and increase in frequency. °· The cervix dilates and gets thinner. °False labor °· Contractions are usually shorter and not as strong as true labor contractions. °· Contractions are usually irregular. °· Contractions  are often felt in the front of the lower abdomen and in the groin. °· Contractions may go away when you walk around or change positions while lying down. °· Contractions get weaker and are shorter-lasting as time goes on. °· The cervix usually does not dilate or become thin. °Follow these instructions at home: °· Take over-the-counter and prescription medicines only as told by your health care provider. °· Keep up with your usual exercises and follow other instructions from your health care provider. °· Eat and drink lightly if you think you are going into labor. °· If Braxton Hicks contractions are making you uncomfortable: °? Change your position from lying down or resting to walking, or change from walking to resting. °? Sit and rest in a tub of warm water. °? Drink enough fluid to keep your urine pale yellow. Dehydration may cause these contractions. °? Do slow and deep breathing several times an hour. °· Keep all follow-up prenatal visits as told by your health care provider. This is important. °Contact a health care provider if: °· You have a fever. °· You have continuous pain in your abdomen. °Get help right away if: °· Your contractions become stronger, more regular, and closer together. °· You have fluid leaking or gushing from your vagina. °· You pass blood-tinged mucus (bloody show). °· You have bleeding from your vagina. °· You have low back pain that you never had before. °· You feel your baby’s head pushing down and causing pelvic pressure. °· Your baby is not moving inside you as much as it used to. °Summary °· Contractions that occur before labor are called Braxton   Hicks contractions, false labor, or practice contractions. °· Braxton Hicks contractions are usually shorter, weaker, farther apart, and less regular than true labor contractions. True labor contractions usually become progressively stronger and regular and they become more frequent. °· Manage discomfort from Braxton Hicks contractions by  changing position, resting in a warm bath, drinking plenty of water, or practicing deep breathing. °This information is not intended to replace advice given to you by your health care provider. Make sure you discuss any questions you have with your health care provider. °Document Released: 01/20/2017 Document Revised: 01/20/2017 Document Reviewed: 01/20/2017 °Elsevier Interactive Patient Education © 2018 Elsevier Inc. ° °

## 2018-06-29 NOTE — Telephone Encounter (Signed)
Pt emailed stating that after her office visit yesterday she had a sharp pain on her left side and watery discharge. Pt states that she has tried taking tylenol and resting but now the pain is in her back. Pt advised to go to [facility] because the pain is becoming unbearable.Understanding was voiced.

## 2018-06-29 NOTE — [facility] Provider Note (Signed)
History   [id number]   Chief Complaint  Patient presents with  . Rupture of Membranes    HPI Tammy Mejia is a 27 y.o. female  G3P0020 @36 .1 wks here with report of leaking clear fluid with white chunks yesterday.  Leaking of fluid has continued in small amounts. Pt reports irregular contractions. She denies vaginal bleeding. Last intercourse was not recenetly. She reports good fetal movement. All other systems negative.    Patient's last menstrual period was 10/10/2017 (exact date).  OB History  Gravida Para Term Preterm AB Living  3       2 0  SAB TAB Ectopic Multiple Live Births  2     0      # Outcome Date GA Lbr Len/2nd Weight Sex Delivery Anes PTL Lv  3 Current           2 SAB           1 SAB             Past Medical History:  Diagnosis Date  . Anemia   . Anxiety   . 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 Back pain   . Deficiency of potassium   . Depression   . HPV in female   . Hypokalemia   . Migraine   . PID (pelvic inflammatory disease)     Family History  Problem Relation Age of Onset  . COPD Mother   . Diabetes Father   . Heart disease Father   . Cancer Neg Hx   . Hypertension Neg Hx   . Stroke Neg Hx     Social History   Socioeconomic History  . Marital status: Married    Spouse name: Not on file  . Number of children: Not on file  . Years of education: Not on file  . Highest education level: Not on file  Occupational History  . Not on file  Social Needs  . Financial resource strain: Not on file  . Food insecurity:    Worry: Not on file    Inability: Not on file  . Transportation needs:    Medical: Not on file    Non-medical: Not on file  Tobacco Use  . Smoking status: Never Smoker  . Smokeless tobacco: Never Used  Substance and Sexual Activity  . Alcohol use: No  . Drug use: No  . Sexual activity: Yes    Birth control/protection: None  Lifestyle  . Physical activity:    Days per week: Not on file   Minutes per session: Not on file  . Stress: Not on file  Relationships  . Social connections:    Talks on phone: Not on file    Gets together: Not on file    Attends religious service: Not on file    Active member of club or organization: Not on file    Attends meetings of clubs or organizations: Not on file    Relationship status: Not on file  Other Topics Concern  . Not on file  Social History Narrative  . Not on file    No Known Allergies  No current facility-administered medications on file prior to encounter.    Current Outpatient Medications on File Prior to Encounter  Medication Sig Dispense Refill  . Elastic Bandages & Supports (COMFORT FIT MATERNITY SUPP MED) MISC 1 Device by Does not apply route daily. (Patient not taking: Reported on 06/22/2018) 1 each 0  . Fe Fum-FePoly-Vit  C-Vit B3 (INTEGRA) 62.5-62.5-40-3 MG CAPS Take 1 capsule by mouth 2 (two) times daily. 60 capsule 2  . loratadine (CLARITIN) 10 MG tablet Take 1 tablet (10 mg total) by mouth daily. (Patient not taking: Reported on 06/22/2018) 30 tablet 0  . ondansetron (ZOFRAN ODT) 4 MG disintegrating tablet Take 1 tablet (4 mg total) by mouth every 6 (six) hours as needed for nausea. 20 tablet 0  . Prenatal Vit-Fe Fumarate-FA (MULTIVITAMIN-PRENATAL) 27-0.8 MG TABS tablet Take 1 tablet by mouth daily at 12 noon.       Review of Systems  Gastrointestinal: Negative for abdominal pain.  Genitourinary: Positive for vaginal discharge. Negative for vaginal bleeding.     Physical Exam   Vitals:   06/29/18 1800  BP: 111/75  Pulse: (!) 111  Resp: 18  Temp: 98.3 F (36.8 C)  TempSrc: Oral  Weight: 59.4 kg  Height: 5\' 1"  (1.549 m)    Physical Exam  Constitutional: She is oriented to person, place, and time. She appears well-developed and well-nourished. No distress.  HENT:  Head: Normocephalic and atraumatic.  Neck: Normal range of motion.  Respiratory: Effort normal. No respiratory distress.  GI: Soft. She  exhibits no distension. There is no tenderness.  gravid  Genitourinary:  Genitourinary Comments: SSE: no pool, fern SVE: 1/50/-2, vtx  Musculoskeletal: Normal range of motion.  Neurological: She is alert and oriented to person, place, and time.  Skin: Skin is warm and dry.  Psychiatric: She has a normal mood and affect.  EFM: 140 bpm, mod variability, + accels, no decels Toco: irregular, irritability  Results for orders placed or performed during the hospital encounter of 06/29/18 (from the past 24 hour(s))  POCT fern test     Status: None   Collection Time: 06/29/18  6:21 PM  Result Value Ref Range   POCT Fern Test Negative = intact amniotic membranes    MAU Course  Procedures  MDM No evidence of SROM or PTL. Stable for discharge home.  Assessment and Plan   1. [redacted] weeks gestation of pregnancy   2. Anemia, antepartum   3. Depression affecting pregnancy, antepartum   4. NST (non-stress test) reactive   5. Vaginal discharge during pregnancy in third trimester    Discharge home Follow up at [facility] next week as scheduled PTL precautions  Allergies as of 06/29/2018   No Known Allergies     Medication List    TAKE these medications   COMFORT FIT MATERNITY SUPP MED Misc 1 Device by Does not apply route daily.   INTEGRA 62.5-62.5-40-3 MG Caps Take 1 capsule by mouth 2 (two) times daily.   loratadine 10 MG tablet Commonly known as:  CLARITIN Take 1 tablet (10 mg total) by mouth daily.   multivitamin-prenatal 27-0.8 MG Tabs tablet Take 1 tablet by mouth daily at 12 noon.   ondansetron 4 MG disintegrating tablet Commonly known as:  ZOFRAN-ODT Take 1 tablet (4 mg total) by mouth every 6 (six) hours as needed for nausea.      Donette Larry, CNM 06/29/2018 6:45 PM

## 2018-06-29 NOTE — MAU Note (Signed)
Pt reports she was at the store and had some fluid leakngi out. Has been having fluid and white pieces coming out off and on since then.  C/o cramping/contractrions about every 7 min

## 2018-07-06 ENCOUNTER — Encounter: Payer: Self-pay | Admitting: Obstetrics & Gynecology

## 2018-07-06 ENCOUNTER — Ambulatory Visit (INDEPENDENT_AMBULATORY_CARE_PROVIDER_SITE_OTHER): Payer: Medicaid Other | Admitting: Obstetrics & Gynecology

## 2018-07-06 VITALS — BP 114/76 | HR 94 | Wt 132.0 lb

## 2018-07-06 DIAGNOSIS — R87610 Atypical squamous cells of undetermined significance on cytologic smear of cervix (ASC-US): Secondary | ICD-10-CM

## 2018-07-06 DIAGNOSIS — O99019 Anemia complicating pregnancy, unspecified trimester: Secondary | ICD-10-CM

## 2018-07-06 DIAGNOSIS — O09899 Supervision of other high risk pregnancies, unspecified trimester: Secondary | ICD-10-CM

## 2018-07-06 DIAGNOSIS — Z3400 Encounter for supervision of normal first pregnancy, unspecified trimester: Secondary | ICD-10-CM

## 2018-07-06 DIAGNOSIS — O99340 Other mental disorders complicating pregnancy, unspecified trimester: Secondary | ICD-10-CM

## 2018-07-06 DIAGNOSIS — F329 Major depressive disorder, single episode, unspecified: Secondary | ICD-10-CM

## 2018-07-06 DIAGNOSIS — R87810 Cervical high risk human papillomavirus (HPV) DNA test positive: Secondary | ICD-10-CM

## 2018-07-06 DIAGNOSIS — F32A Depression, unspecified: Secondary | ICD-10-CM

## 2018-07-06 DIAGNOSIS — O9989 Other specified diseases and conditions complicating pregnancy, childbirth and the puerperium: Secondary | ICD-10-CM

## 2018-07-06 DIAGNOSIS — Z283 Underimmunization status: Secondary | ICD-10-CM

## 2018-07-06 NOTE — Progress Notes (Signed)
   PRENATAL VISIT NOTE  Subjective:  Tammy Mejia is a 27 y.o. G3P0020 at 72w1dbeing seen today for ongoing prenatal care.  She is currently monitored for the following issues for this low-risk pregnancy and has Palpitations; Migraine; Anxiety and depression; Supervision of normal first pregnancy, antepartum; Rubella non-immune status, antepartum; ASCUS with positive high risk HPV cervical; Depression affecting pregnancy, antepartum; and Anemia, antepartum on their problem list.  Patient reports no complaints.  Contractions: Irregular. Vag. Bleeding: None.  Movement: Present. Denies leaking of fluid.   The following portions of the patient's history were reviewed and updated as appropriate: allergies, current medications, past family history, past medical history, past social history, past surgical history and problem list. Problem list updated.  Objective:   Vitals:   07/06/18 0808  BP: 114/76  Pulse: 94  Weight: 132 lb (59.9 kg)    Fetal Status: Fetal Heart Rate (bpm): 134   Movement: Present     General:  Alert, oriented and cooperative. Patient is in no acute distress.  Skin: Skin is warm and dry. No rash noted.   Cardiovascular: Normal heart rate noted  Respiratory: Normal respiratory effort, no problems with respiration noted  Abdomen: Soft, gravid, appropriate for gestational age.  Pain/Pressure: Present     Pelvic: Cervical exam deferred        Extremities: Normal range of motion.  Edema: Trace  Mental Status: Normal mood and affect. Normal behavior. Normal judgment and thought content.   Assessment and Plan:  Pregnancy: GR3P3668at 31w1d1. Supervision of normal first pregnancy, antepartum  2. Rubella non-immune status, antepartum Need MMR PP  3. Depression affecting pregnancy, antepartum Pt stable off meds  4. ASCUS with positive high risk HPV cervical Needs repeat colpo or PAP PP   5. Anemia, antepartum Pt prescribed FeSO4  Term labor symptoms and general  obstetric precautions including but not limited to vaginal bleeding, contractions, leaking of fluid and fetal movement were reviewed in detail with the patient. Please refer to After Visit Summary for other counseling recommendations.  Return in about 1 week (around 07/13/2018).  No future appointments.  CaLavonia DraftsMD

## 2018-07-06 NOTE — Patient Instructions (Signed)
Natural Childbirth Natural childbirth is going through labor and delivery without any drugs to relieve pain. Additionally, fetal monitors are not used, a delivery is not done, and a surgical cut to enlarge the vaginal opening (episiotomy) is not made. With the help of a birthing professional (midwife or health care provider), you direct your own labor and delivery. Many women choose natural childbirth because it makes them feel more in control and in touch with their labor and delivery. Some woman also choose natural childbirth because they are concerned about medicines affecting them and their babies. Pregnant women with a high-risk pregnancy should not attempt natural childbirth. It is better to deliver the infant in a hospital if an emergency situation arises. Sometimes, a health care provider has to get involved for the health and safety of the mother and infant. Techniques for natural childbirth  The Lamaze method-This method teaches parents that having a baby is normal, healthy, and natural. It also teaches the mother to take a neutral position regarding pain medicine and numbing medicines and to make an informed decision about using these medicines when the time comes.  The Bradley method (also called husband-coached birth)-This method teaches the father or partner to be the birth coach. It encourages the mother to exercise and eat a balanced, nutritious diet. It also involves relaxation and deep breathing exercises and preparing the parents for emergency situations. Methods of dealing with labor pain and delivery  Meditation.  Yoga.  Hypnosis.  Acupuncture.  Massage.  Changing positions (walking, rocking, showering, leaning on birth balls).  Lying in warm water or a whirlpool bath.  Finding an activity that keeps your mind off of the labor pain.  Listening to soft music.  Focusing on a particular object (visual imagery). Before going into labor  Be sure you and your spouse or  partner are in agreement about having a natural childbirth.  Decide if your health care provider or a midwife will deliver your baby.  Decide if you will have your baby in the hospital, at a birthing center, or at home.  If you have children, make plans to have someone take care of them when you go to the hospital or birthing center.  Know the distance and the time it takes to go to the hospital or birthing center. Practice going there and time it to be sure.  Have a bag packed with a nightgown, bathrobe, and toiletries. Be ready to take it with you when you go into labor.  Keep phone numbers of your family and friends handy if you need to call someone when you go into labor.  Your spouse or partner should go to all the natural childbirth technique classes.  Talk with your health care provider about the possibility of a medical emergency and what will happen if that occurs. Advantages of natural childbirth  You are in control of your labor and delivery.  You will not take medicines that could affect you and the baby.  There are no invasive procedures, such as an episiotomy.  You and your spouse or partner will work together, which can increase your bond with each other.  In most delivery centers, your family and friends can be involved in the labor and delivery process. Disadvantages of natural childbirth  You will experience pain during your labor and delivery.  The methods of helping relieve your labor pains may not work for you.  You may feel disappointed if you decide to change your mind during labor and not   have a natural childbirth. After the delivery  You will be very tired.  You will be uncomfortable because of your uterus contracting. You will feel soreness around the vagina.  You may feel cold and shaky. This is a natural reaction. This information is not intended to replace advice given to you by your health care provider. Make sure you discuss any questions you  have with your health care provider. Document Released: 08/19/2008 Document Revised: 02/12/2016 Document Reviewed: 05/14/2013 Elsevier Interactive Patient Education  2017 Else[employer] Inc[employer]  

## 2018-07-12 ENCOUNTER — Ambulatory Visit (INDEPENDENT_AMBULATORY_CARE_PROVIDER_SITE_OTHER): Payer: Medicaid Other | Admitting: Obstetrics & Gynecology

## 2018-07-12 VITALS — BP 126/73 | HR 89 | Wt 131.1 lb

## 2018-07-12 DIAGNOSIS — Z3403 Encounter for supervision of normal first pregnancy, third trimester: Secondary | ICD-10-CM | POA: Diagnosis not present

## 2018-07-12 DIAGNOSIS — F32A Depression, unspecified: Secondary | ICD-10-CM

## 2018-07-12 DIAGNOSIS — Z283 Underimmunization status: Secondary | ICD-10-CM

## 2018-07-12 DIAGNOSIS — F329 Major depressive disorder, single episode, unspecified: Secondary | ICD-10-CM

## 2018-07-12 DIAGNOSIS — R87610 Atypical squamous cells of undetermined significance on cytologic smear of cervix (ASC-US): Secondary | ICD-10-CM

## 2018-07-12 DIAGNOSIS — Z3400 Encounter for supervision of normal first pregnancy, unspecified trimester: Secondary | ICD-10-CM

## 2018-07-12 DIAGNOSIS — O99340 Other mental disorders complicating pregnancy, unspecified trimester: Secondary | ICD-10-CM | POA: Diagnosis not present

## 2018-07-12 DIAGNOSIS — O09899 Supervision of other high risk pregnancies, unspecified trimester: Secondary | ICD-10-CM

## 2018-07-12 DIAGNOSIS — O99019 Anemia complicating pregnancy, unspecified trimester: Secondary | ICD-10-CM

## 2018-07-12 DIAGNOSIS — O9989 Other specified diseases and conditions complicating pregnancy, childbirth and the puerperium: Secondary | ICD-10-CM | POA: Diagnosis not present

## 2018-07-12 DIAGNOSIS — R87810 Cervical high risk human papillomavirus (HPV) DNA test positive: Secondary | ICD-10-CM

## 2018-07-12 NOTE — Progress Notes (Signed)
   PRENATAL VISIT NOTE  Subjective:  Tammy Mejia is a 27 y.o. G3P0020 at 50w0dbeing seen today for ongoing prenatal care.  She is currently monitored for the following issues for this low-risk pregnancy and has Palpitations; Migraine; Anxiety and depression; Supervision of normal first pregnancy, antepartum; Rubella non-immune status, antepartum; ASCUS with positive high risk HPV cervical; Depression affecting pregnancy, antepartum; and Anemia, antepartum on their problem list.  Patient reports no complaints.  Contractions: Irregular. Vag. Bleeding: None.  Movement: Present. Denies leaking of fluid.   The following portions of the patient's history were reviewed and updated as appropriate: allergies, current medications, past family history, past medical history, past social history, past surgical history and problem list. Problem list updated.  Objective:   Vitals:   07/12/18 1359  BP: 126/73  Pulse: 89  Weight: 131 lb 1.9 oz (59.5 kg)    Fetal Status: Fetal Heart Rate (bpm): 140   Movement: Present     General:  Alert, oriented and cooperative. Patient is in no acute distress.  Skin: Skin is warm and dry. No rash noted.   Cardiovascular: Normal heart rate noted  Respiratory: Normal respiratory effort, no problems with respiration noted  Abdomen: Soft, gravid, appropriate for gestational age.  Pain/Pressure: Present     Pelvic: Cervical exam performed  Extremities: Normal range of motion.  Edema: Trace  Mental Status: Normal mood and affect. Normal behavior. Normal judgment and thought content.   Assessment and Plan:  Pregnancy: G3P0020 at 385w0d1. Supervision of normal first pregnancy, antepartum  - Culture, beta strep (group b only)  2. Rubella non-immune status, antepartum Needs MMR PP  3. Depression affecting pregnancy, antepartum Stable. Discussed with pt probable need for meds PP   4. Anemia, antepartum  5. ASCUS with positive high risk HPV cervical Needs  colpo PP  Term labor symptoms and general obstetric precautions including but not limited to vaginal bleeding, contractions, leaking of fluid and fetal movement were reviewed in detail with the patient. Please refer to After Visit Summary for other counseling recommendations.  Return in about 1 week (around 07/19/2018).  No future appointments.  CaLavonia DraftsMD

## 2018-07-16 LAB — CULTURE, BETA STREP (GROUP B ONLY): STREP GP B CULTURE: NEGATIVE

## 2018-07-18 ENCOUNTER — Inpatient Hospital Stay (HOSPITAL_BASED_OUTPATIENT_CLINIC_OR_DEPARTMENT_OTHER)
Admission: EM | Admit: 2018-07-18 | Discharge: 2018-07-18 | Disposition: A | Discharge status: Home / Self Care | Payer: Medicaid Other | Attending: Family Medicine | Admitting: Family Medicine

## 2018-07-18 ENCOUNTER — Encounter (HOSPITAL_BASED_OUTPATIENT_CLINIC_OR_DEPARTMENT_OTHER): Payer: Self-pay | Admitting: Emergency Medicine

## 2018-07-18 ENCOUNTER — Other Ambulatory Visit: Payer: Self-pay

## 2018-07-18 DIAGNOSIS — F419 Anxiety disorder, unspecified: Secondary | ICD-10-CM | POA: Insufficient documentation

## 2018-07-18 DIAGNOSIS — Z3A30 30 weeks gestation of pregnancy: Secondary | ICD-10-CM

## 2018-07-18 DIAGNOSIS — O99343 Other mental disorders complicating pregnancy, third trimester: Secondary | ICD-10-CM | POA: Diagnosis not present

## 2018-07-18 DIAGNOSIS — M545 Low back pain, unspecified: Secondary | ICD-10-CM

## 2018-07-18 DIAGNOSIS — Z833 Family history of diabetes mellitus: Secondary | ICD-10-CM | POA: Diagnosis not present

## 2018-07-18 DIAGNOSIS — Z79899 Other long term (current) drug therapy: Secondary | ICD-10-CM | POA: Diagnosis not present

## 2018-07-18 DIAGNOSIS — O4703 False labor before 37 completed weeks of gestation, third trimester: Secondary | ICD-10-CM

## 2018-07-18 DIAGNOSIS — Z9889 Other specified postprocedural states: Secondary | ICD-10-CM | POA: Insufficient documentation

## 2018-07-18 DIAGNOSIS — O99013 Anemia complicating pregnancy, third trimester: Secondary | ICD-10-CM | POA: Insufficient documentation

## 2018-07-18 DIAGNOSIS — O479 False labor, unspecified: Secondary | ICD-10-CM

## 2018-07-18 MED ORDER — CYCLOBENZAPRINE HCL 10 MG PO TABS: 5.0000 mg | ORAL_TABLET | Freq: Three times a day (TID) | ORAL | 2 refills | Status: DC | PRN

## 2018-07-18 NOTE — MAU Note (Signed)
Pt presents to MAU via [facility] from [facility] due to back pain and ctx that started last night around 9 pm. Pt denies LOF and VB. +FM

## 2018-07-18 NOTE — ED Triage Notes (Signed)
Reports lower back pain which began last night.  Currently [redacted] weeks pregnant.  States "I don't want to go to MAU".

## 2018-07-18 NOTE — Progress Notes (Addendum)
Received call regarding this 27 yo G3P0 @ 38.[redacted]wks GA in with complaint of intermittent back pain. Denies vaginal bleeding or LOF. Reports light vaginal discharge. 1106 Dr. Adrian Blackwater notified of pt in ED and of above. He will contact the EDP for POC. 1149 Per ED RN, pt wants to transfer to [facility] because she does not want to come in through MAU. Dr. Adrian Blackwater notified. 1157 Pt will accept transfer to Labor and Delivery at [facility].  Dr. Adrian Blackwater accepting MD.  [facility] ED RN will call [facility].

## 2018-07-18 NOTE — ED Notes (Signed)
Monitor removed for transport

## 2018-07-18 NOTE — ED Provider Notes (Signed)
[facility] EMERGENCY DEPARTMENT Provider Note   CSN: [id number] Arrival date & time: 07/18/18  1022     History   Chief Complaint Chief Complaint  Patient presents with  . Back Pain    HPI Tammy Mejia is a G3 P64 27 y.o. female at [redacted]w[redacted]d gestation who presents with back pain.  She says that her back pain started yesterday and has been worsening since then.  She has had contractions, but she does not know how frequently they have been occurring.  She says that sometimes, she cannot talk through them.  She has tried many different position changes without improvement of her pain.  She denies vaginal bleeding or loss of vaginal fluid.  She can feel her baby move.  She says that she had a cervical exam last week and was told that her cervix was 2.5 cm dilated and soft.  Recent says that although she received all of her prenatal care at [facility], she "does not want to go to the MAU."  She would like to go to [facility] instead to deliver her baby.  Past Medical History:  Diagnosis Date  . Anemia   . Anxiety   . 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 Back pain   . Deficiency of potassium   . Depression   . HPV in female   . Hypokalemia   . Migraine   . PID (pelvic inflammatory disease)     Patient Active Problem List   Diagnosis Date Noted  . Normal labor 07/18/2018  . Anemia, antepartum 05/09/2018  . Depression affecting pregnancy, antepartum 01/30/2018  . 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 Rubella non-immune status, antepartum 12/06/2017  . Supervision of normal first pregnancy, antepartum 12/05/2017  . Anxiety and depression 03/16/2016  . Migraine 08/23/2014  . Palpitations 06/29/2013    Past Surgical History:  Procedure Laterality Date  . DILATION AND CURETTAGE OF UTERUS    . DILATION AND EVACUATION N/A 05/13/2017   Procedure: DILATATION AND EVACUATION;  Surgeon: Reva Bores, MD;   Location: [facility] ORS;  Service: Gynecology;  Laterality: N/A;     OB History    Gravida  3   Para      Term      Preterm      AB  2   Living  0     SAB  2   TAB      Ectopic      Multiple  0   Live Births               Home Medications    Prior to Admission medications   Medication Sig Start Date End Date Taking? Authorizing Provider  Fe Fum-FePoly-Vit C-Vit B3 (INTEGRA) 62.5-62.5-40-3 MG CAPS Take 1 capsule by mouth 2 (two) times daily. 05/10/18   Willodean Rosenthal, MD  loratadine (CLARITIN) 10 MG tablet Take 1 tablet (10 mg total) by mouth daily. 05/19/18   Sharlene Dory, DO  ondansetron (ZOFRAN ODT) 4 MG disintegrating tablet Take 1 tablet (4 mg total) by mouth every 6 (six) hours as needed for nausea. 04/28/18   Leftwich-Kirby, Wilmer Floor, CNM  Prenatal Vit-Fe Fumarate-FA (MULTIVITAMIN-PRENATAL) 27-0.8 MG TABS tablet Take 1 tablet by mouth daily at 12 noon.    [provider]    Family History Family History  Problem Relation Age of Onset  . COPD Mother   . Diabetes Father   .  Heart disease Father   . Cancer Neg Hx   . Hypertension Neg Hx   . Stroke Neg Hx     Social History Social History   Tobacco Use  . Smoking status: Never Smoker  . Smokeless tobacco: Never Used  Substance Use Topics  . Alcohol use: No  . Drug use: No     Allergies   Patient has no known allergies.   Review of Systems Review of Systems  Constitutional: Negative for activity change, appetite change, chills and fever.  HENT: Negative for congestion.   Respiratory: Negative for cough, shortness of breath and wheezing.   Cardiovascular: Negative for chest pain.  Gastrointestinal: Positive for abdominal pain. Negative for vomiting.  Genitourinary: Negative for dysuria.  Musculoskeletal: Positive for back pain.  Neurological: Negative for light-headedness and headaches.  Psychiatric/Behavioral: The patient is not nervous/anxious.      Physical  Exam Updated Vital Signs BP 106/65   Pulse 92   Temp 98.2 F (36.8 C) (Oral)   Resp 16   Ht 5\' 1"  (1.549 m)   Wt 59 kg   LMP 10/10/2017 (Exact Date)   SpO2 100%   BMI 24.58 kg/m   Physical Exam  Constitutional: She is oriented to person, place, and time. She appears well-developed and well-nourished. No distress.  Having contractions every 4 to 5 minutes on exam.  These contractions seem uncomfortable for her.  HENT:  Head: Normocephalic and atraumatic.  Mouth/Throat: No oropharyngeal exudate.  Eyes: Conjunctivae and EOM are normal.  Neck: Normal range of motion. Neck supple.  Cardiovascular: Normal rate, regular rhythm and normal heart sounds.  Pulmonary/Chest: Effort normal and breath sounds normal.  Musculoskeletal: Normal range of motion. She exhibits no edema.  Neurological: She is alert and oriented to person, place, and time.  Skin: Skin is warm and dry. She is not diaphoretic.  Psychiatric: She has a normal mood and affect. Her behavior is normal.   Distal cervical exam: 2.5/2/ballotable  ED Treatments / Results  Labs (all labs ordered are listed, but only abnormal results are displayed) Labs Reviewed - No data to display  EKG None  Radiology No results found.  Procedures Procedures (including critical care time)  Medications Ordered in ED Medications - No data to display   Initial Impression / Assessment and Plan / ED Course  I have reviewed the triage vital signs and the nursing notes.  Pertinent labs & imaging results that were available during my care of the patient were reviewed by me and considered in my medical decision making (see chart for details).     She does not appear to be an active later labor since her cervical exam has not changed since being measured a week ago, but since she is term and having frequent contractions, she should be evaluated in a maternity unit.  This was explained to the patient, and she reiterated her desire to go to  [facility].  She was initially accepted for transfer there, but when she found out that her physician, Dr. Adrian Blackwater, was on call today, she said that she would be okay with going to [facility].  Since delivery does not seem imminent and fetal heart tracing is category 1, patient is safe for transfer to [facility] for further evaluation.  Final Clinical Impressions(s) / ED Diagnoses   Final diagnoses:  Irregular uterine contractions    ED Discharge Orders    None       Jovi Zavadil, Harlen Labs, MD  07/18/18 1403    Melene Plan, DO 07/18/18 1544

## 2018-07-18 NOTE — MAU Provider Note (Signed)
History     CSN: [id number]  Arrival date and time: 07/18/18 1022   First Provider Initiated Contact with Patient 07/18/18 1410      Chief Complaint  Patient presents with  . Back Pain   HPI This is a 27 year old G3 P0 at 30 weeks 6 days.  She was transferred here from [facility] ED for back pain and contractions.  Her contractions are every 3 to 5 minutes and are mild.  She complains of back pain that is mostly on the right side with some radiation down into the right thigh.  This started last night and is quite severe at times.  She had little sleep last night.  No numbness, weakness, lack of sensation.  Denies injury.  No palliating or provoking factors: She tried Tylenol without effect.  OB History    Gravida  3   Para      Term      Preterm      AB  2   Living  0     SAB  2   TAB      Ectopic      Multiple  0   Live Births              Past Medical History:  Diagnosis Date  . Anemia   . Anxiety   . 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 Back pain   . Deficiency of potassium   . Depression   . HPV in female   . Hypokalemia   . Migraine   . PID (pelvic inflammatory disease)     Past Surgical History:  Procedure Laterality Date  . DILATION AND CURETTAGE OF UTERUS    . DILATION AND EVACUATION N/A 05/13/2017   Procedure: DILATATION AND EVACUATION;  Surgeon: Reva Bores, MD;  Location: [facility] ORS;  Service: Gynecology;  Laterality: N/A;    Family History  Problem Relation Age of Onset  . COPD Mother   . Diabetes Father   . Heart disease Father   . Cancer Neg Hx   . Hypertension Neg Hx   . Stroke Neg Hx     Social History   Tobacco Use  . Smoking status: Never Smoker  . Smokeless tobacco: Never Used  Substance Use Topics  . Alcohol use: No  . Drug use: No    Allergies: No Known Allergies  Medications Prior to Admission  Medication Sig Dispense Refill Last Dose  . Fe Fum-FePoly-Vit C-Vit B3 (INTEGRA)  62.5-62.5-40-3 MG CAPS Take 1 capsule by mouth 2 (two) times daily. 60 capsule 2 Taking  . loratadine (CLARITIN) 10 MG tablet Take 1 tablet (10 mg total) by mouth daily. 30 tablet 0 Taking  . ondansetron (ZOFRAN ODT) 4 MG disintegrating tablet Take 1 tablet (4 mg total) by mouth every 6 (six) hours as needed for nausea. 20 tablet 0 Taking  . Prenatal Vit-Fe Fumarate-FA (MULTIVITAMIN-PRENATAL) 27-0.8 MG TABS tablet Take 1 tablet by mouth daily at 12 noon.   Taking    Review of Systems Physical Exam   Blood pressure 106/65, pulse 92, temperature 98.2 F (36.8 C), temperature source Oral, resp. rate 16, height 5\' 1"  (1.549 m), weight 59 kg, last menstrual period 10/10/2017, SpO2 100 %, unknown if currently breastfeeding.  Physical Exam  Constitutional: She is oriented to person, place, and time. She appears well-developed and well-nourished.  Cardiovascular: Normal rate.  Respiratory: Effort normal.  GI: Soft. Bowel sounds are normal. She  exhibits no distension. There is no tenderness. There is no rebound and no guarding.  Musculoskeletal: She exhibits no edema, tenderness or deformity.  Right muscle spasms in lumbar spine.  OSE: L1 ESRL, L5 ESRR. L/L torsion. Right ant innom.  Neurological: She is alert and oriented to person, place, and time.  Skin: Skin is warm and dry.  Psychiatric: She has a normal mood and affect. Her behavior is normal. Judgment and thought content normal.   Dilation: 3.5 Effacement (%): 70 Cervical Position: Posterior Presentation: Vertex Exam by:: Adrian Blackwater, MD  MAU Course  Procedures NST: Category 1 tracing. Moderate variability, no decel. Multiple accel. Ctxs every 4-5 minutes  After pt permission OMT done with good effect: improvement of motion and mild improvement of pain. HVLA utilized to treat 3 areas.  MDM  Assessment and Plan     ICD-10-CM   1. Irregular uterine contractions O62.2   2. Acute right-sided low back pain without sciatica M54.5     Likely early labor. Return precautions given. Flexeril 5-10mg  TID prn. D/c to home.   Levie Heritage 07/18/2018, 2:20 PM

## 2018-07-18 NOTE — Discharge Instructions (Signed)

## 2018-07-18 NOTE — ED Notes (Signed)
Pt states she is having lower back pain and intermittent abdominal contractions.  Pt is unable to determine frequency, only that they feel severe.

## 2018-07-20 ENCOUNTER — Encounter: Payer: Self-pay | Admitting: Family Medicine

## 2018-07-24 MED ORDER — OXYTOCIN-SODIUM CHLORIDE 30-0.9 UT/500ML-% IV SOLN
30.00 | INTRAVENOUS | Status: DC
Start: ? — End: 2018-07-24

## 2018-07-24 MED ORDER — ACETAMINOPHEN 325 MG PO TABS
650.00 | ORAL_TABLET | ORAL | Status: DC
Start: ? — End: 2018-07-24

## 2018-07-24 MED ORDER — SODIUM CHLORIDE FLUSH 0.9 % IV SOLN
10.00 | INTRAVENOUS | Status: DC
Start: 2018-07-22 — End: 2018-07-24

## 2018-07-24 MED ORDER — BENZOCAINE-MENTHOL 20-0.5 % EX AERO
1.00 | INHALATION_SPRAY | CUTANEOUS | Status: DC
Start: ? — End: 2018-07-24

## 2018-07-24 MED ORDER — HYDROCODONE-ACETAMINOPHEN 5-325 MG PO TABS
1.00 | ORAL_TABLET | ORAL | Status: DC
Start: ? — End: 2018-07-24

## 2018-07-24 MED ORDER — DOCUSATE SODIUM 100 MG PO CAPS
100.00 | ORAL_CAPSULE | ORAL | Status: DC
Start: ? — End: 2018-07-24

## 2018-07-24 MED ORDER — ONDANSETRON HCL 4 MG PO TABS
4.00 | ORAL_TABLET | ORAL | Status: DC
Start: ? — End: 2018-07-24

## 2018-07-24 MED ORDER — TETANUS-DIPHTH-ACELL PERTUSSIS 5-2.5-18.5 LF-MCG/0.5 IM SUSP
0.50 | INTRAMUSCULAR | Status: DC
Start: ? — End: 2018-07-24

## 2018-07-24 MED ORDER — IBUPROFEN 400 MG PO TABS
800.00 | ORAL_TABLET | ORAL | Status: DC
Start: 2018-07-22 — End: 2018-07-24

## 2018-07-24 MED ORDER — TRAMADOL HCL 50 MG PO TABS
100.00 | ORAL_TABLET | ORAL | Status: DC
Start: ? — End: 2018-07-24

## 2018-07-24 MED ORDER — SODIUM CHLORIDE FLUSH 0.9 % IV SOLN
10.00 | INTRAVENOUS | Status: DC
Start: ? — End: 2018-07-24

## 2018-07-24 MED ORDER — DIPHENHYDRAMINE HCL 25 MG PO CAPS
25.00 | ORAL_CAPSULE | ORAL | Status: DC
Start: ? — End: 2018-07-24

## 2018-07-24 MED ORDER — MAGNESIUM HYDROXIDE 400 MG/5ML PO SUSP
30.00 | ORAL | Status: DC
Start: ? — End: 2018-07-24

## 2018-07-24 MED ORDER — ENEMA 7-19 GM/118ML RE ENEM
1.00 | ENEMA | RECTAL | Status: DC
Start: ? — End: 2018-07-24

## 2018-07-24 MED ORDER — GENERIC EXTERNAL MEDICATION
5.00 | Status: DC
Start: ? — End: 2018-07-24

## 2018-07-24 MED ORDER — OXYCODONE-ACETAMINOPHEN 5-325 MG PO TABS
1.00 | ORAL_TABLET | ORAL | Status: DC
Start: ? — End: 2018-07-24

## 2018-07-24 MED ORDER — VARICELLA VIRUS VACCINE LIVE 1350 PFU/0.5ML ~~LOC~~ INJ
0.50 | INJECTION | SUBCUTANEOUS | Status: DC
Start: ? — End: 2018-07-24

## 2018-07-24 MED ORDER — BISACODYL 10 MG RE SUPP
10.00 | RECTAL | Status: DC
Start: ? — End: 2018-07-24

## 2018-07-24 MED ORDER — ALUMINUM-MAGNESIUM-SIMETHICONE 200-200-20 MG/5ML PO SUSP
30.00 | ORAL | Status: DC
Start: ? — End: 2018-07-24

## 2018-07-24 MED ORDER — MEASLES, MUMPS & RUBELLA VAC ~~LOC~~ INJ
0.50 | INJECTION | SUBCUTANEOUS | Status: DC
Start: ? — End: 2018-07-24

## 2018-07-24 MED ORDER — DOCUSATE SODIUM 100 MG PO CAPS
100.00 | ORAL_CAPSULE | ORAL | Status: DC
Start: 2018-07-22 — End: 2018-07-24

## 2018-07-24 MED ORDER — PRENATAL 27-0.8 MG PO TABS
1.00 | ORAL_TABLET | ORAL | Status: DC
Start: 2018-07-23 — End: 2018-07-24

## 2018-07-24 MED ORDER — SIMETHICONE 80 MG PO CHEW
80.00 | CHEWABLE_TABLET | ORAL | Status: DC
Start: ? — End: 2018-07-24

## 2018-07-24 MED ORDER — WITCH HAZEL-GLYCERIN EX PADS
1.00 | MEDICATED_PAD | CUTANEOUS | Status: DC
Start: ? — End: 2018-07-24

## 2018-08-03 ENCOUNTER — Ambulatory Visit: Payer: Medicaid Other | Admitting: Nurse Practitioner

## 2018-08-03 ENCOUNTER — Encounter: Payer: Self-pay | Admitting: Nurse Practitioner

## 2018-08-03 VITALS — BP 112/80 | HR 85 | Temp 98.4°F | Ht 61.0 in | Wt 120.0 lb

## 2018-08-03 DIAGNOSIS — G43719 Chronic migraine without aura, intractable, without status migrainosus: Secondary | ICD-10-CM | POA: Diagnosis not present

## 2018-08-03 DIAGNOSIS — L739 Follicular disorder, unspecified: Secondary | ICD-10-CM | POA: Diagnosis not present

## 2018-08-03 MED ORDER — CEPHALEXIN 500 MG PO CAPS: 500.0000 mg | ORAL_CAPSULE | Freq: Three times a day (TID) | ORAL | 0 refills | Status: DC

## 2018-08-03 MED ORDER — SACCHAROMYCES BOULARDII 250 MG PO CAPS: 250.0000 mg | ORAL_CAPSULE | Freq: Two times a day (BID) | ORAL | Status: DC

## 2018-08-03 MED ORDER — KETOROLAC TROMETHAMINE 30 MG/ML IJ SOLN
30.0000 mg | Freq: Once | INTRAMUSCULAR | Status: AC
  Administered 2018-08-03: 30 mg via INTRAMUSCULAR

## 2018-08-03 MED ORDER — CHLORHEXIDINE GLUCONATE 4 % EX LIQD: CUTANEOUS | 0 refills | Status: DC

## 2018-08-03 MED ORDER — CEPHALEXIN 500 MG PO CAPS: 500.0000 mg | ORAL_CAPSULE | Freq: Two times a day (BID) | ORAL | 0 refills | Status: DC

## 2018-08-03 NOTE — Progress Notes (Signed)
Subjective:  Patient ID: Tammy Mejia, femaleOtilio Mejia    DOB: 04/02/1991  Age: 27 y.o. MRN: [id number]  CC: Migraine (pt is complaining of migrains,going on 1 wks. tried hot bath,ibuprofen and advil. just had her baby 2 wk ago,breast feeding --had epidural inj 2 wks ago. )  Rash  This is a new problem. The current episode started in the past 7 days. The problem is unchanged. The affected locations include the left buttock and right buttock. The rash is characterized by redness, blistering and draining. She was exposed to nothing. Pertinent negatives include no anorexia or fever. Past treatments include nothing.  Migraine   This is a chronic problem. The current episode started in the past 7 days. The problem occurs intermittently. The problem has been waxing and waning. The pain is located in the bilateral region. The pain does not radiate. The pain quality is similar to prior headaches. The quality of the pain is described as aching, dull and throbbing. Associated symptoms include photophobia. Pertinent negatives include no abnormal behavior, anorexia, blurred vision, fever, hearing loss, insomnia, nausea, numbness, scalp tenderness, sinus pressure or visual change. The symptoms are aggravated by unknown. She has tried acetaminophen and NSAIDs for the symptoms. The treatment provided mild relief. Her past medical history is significant for migraine headaches. There is no history of hypertension, obesity or recent head traumas.   appt with GYN/OB: 08/24/2018.  Reviewed past Medical, Social and Family history today.  Outpatient Medications Prior to Visit  Medication Sig Dispense Refill  . Fe Fum-FePoly-Vit C-Vit B3 (INTEGRA) 62.5-62.5-40-3 MG CAPS Take 1 capsule by mouth 2 (two) times daily. 60 capsule 2  . loratadine (CLARITIN) 10 MG tablet Take 1 tablet (10 mg total) by mouth daily. 30 tablet 0  . ondansetron (ZOFRAN ODT) 4 MG disintegrating tablet Take 1 tablet (4 mg total) by mouth every 6 (six)  hours as needed for nausea. 20 tablet 0  . Prenatal Vit-Fe Fumarate-FA (MULTIVITAMIN-PRENATAL) 27-0.8 MG TABS tablet Take 1 tablet by mouth daily at 12 noon.    . cyclobenzaprine (FLEXERIL) 10 MG tablet Take 0.5-1 tablets (5-10 mg total) by mouth 3 (three) times daily as needed for muscle spasms. (Patient not taking: Reported on 08/03/2018) 30 tablet 2   No facility-administered medications prior to visit.     ROS See HPI  Objective:  BP 112/80   Pulse 85   Temp 98.4 F (36.9 C) (Oral)   Ht 5\' 1"  (1.549 m)   Wt 120 lb (54.4 kg)   LMP 10/10/2017 (Exact Date)   SpO2 98%   BMI 22.67 kg/m   BP Readings from Last 3 Encounters:  08/03/18 112/80  07/18/18 112/69  07/12/18 126/73    Wt Readings from Last 3 Encounters:  08/03/18 120 lb (54.4 kg)  07/18/18 130 lb 1.1 oz (59 kg)  07/12/18 131 lb 1.9 oz (59.5 kg)    Physical Exam  Constitutional: She is oriented to person, place, and time.  Neck: Normal range of motion. Neck supple.  Cardiovascular: Normal rate.  Pulmonary/Chest: Effort normal.  Neurological: She is alert and oriented to person, place, and time. Coordination normal.  Skin: Rash noted. Rash is pustular. There is erythema.     Psychiatric: She has a normal mood and affect. Her behavior is normal. Thought content normal.  Vitals reviewed.   Lab Results  Component Value Date   WBC 10.2 05/01/2018   HGB 8.4 (L) 05/01/2018   HCT 27.1 (L) 05/01/2018   PLT 335  05/01/2018   GLUCOSE 70 04/28/2018   ALT 15 04/28/2018   AST 19 04/28/2018   NA 135 04/28/2018   K 3.3 (L) 04/28/2018   CL 103 04/28/2018   CREATININE 0.42 (L) 04/28/2018   BUN <5 (L) 04/28/2018   CO2 23 04/28/2018   TSH 1.19 01/13/2016    Assessment & Plan:   Aunica was seen today for migraine.  Diagnoses and all orders for this visit:  Intractable chronic migraine without aura and without status migrainosus -     ketorolac (TORADOL) 30 MG/ML injection 30 mg  Folliculitis -      Discontinue: cephALEXin (KEFLEX) 500 MG capsule; Take 1 capsule (500 mg total) by mouth 3 (three) times daily. -     chlorhexidine (HIBICLENS) 4 % external liquid; Apply topically every other day. -     saccharomyces boulardii (FLORASTOR) 250 MG capsule; Take 1 capsule (250 mg total) by mouth 2 (two) times daily. -     cephALEXin (KEFLEX) 500 MG capsule; Take 1 capsule (500 mg total) by mouth 2 (two) times daily.   I have discontinued Kera B. Shippy's cyclobenzaprine. I have also changed her cephALEXin. Additionally, I am having her start on chlorhexidine and saccharomyces boulardii. Lastly, I am having her maintain her multivitamin-prenatal, ondansetron, INTEGRA, and loratadine. We administered ketorolac.  Meds ordered this encounter  Medications  . ketorolac (TORADOL) 30 MG/ML injection 30 mg  . DISCONTD: cephALEXin (KEFLEX) 500 MG capsule    Sig: Take 1 capsule (500 mg total) by mouth 3 (three) times daily.    Dispense:  21 capsule    Refill:  0    Order Specific Question:   Supervising Provider    Answer:   Dianne Dun [[id number]]  . chlorhexidine (HIBICLENS) 4 % external liquid    Sig: Apply topically every other day.    Dispense:  120 mL    Refill:  0    Order Specific Question:   Supervising Provider    Answer:   Dianne Dun [[id number]]  . saccharomyces boulardii (FLORASTOR) 250 MG capsule    Sig: Take 1 capsule (250 mg total) by mouth 2 (two) times daily.    Order Specific Question:   Supervising Provider    Answer:   Dianne Dun [[id number]]  . cephALEXin (KEFLEX) 500 MG capsule    Sig: Take 1 capsule (500 mg total) by mouth 2 (two) times daily.    Dispense:  14 capsule    Refill:  0    Change in dose. cancel keflex TID    Order Specific Question:   Supervising Provider    Answer:   Dianne Dun [[id number]]    Follow-up: No follow-ups on file.  Alysia Penna, NP

## 2018-08-03 NOTE — Patient Instructions (Signed)
Maintain adequate oral hydration. Alternate between tylenol and ibuprofen for headache.  Folliculitis Folliculitis is inflammation of the hair follicles. Folliculitis most commonly occurs on the scalp, thighs, legs, back, and buttocks. However, it can occur anywhere on the body. What are the causes? This condition may be caused by:  A bacterial infection (common).  A fungal infection.  A viral infection.  Coming into contact with certain chemicals, especially oils and tars.  Shaving or waxing.  Applying greasy ointments or creams to your skin often.  Long-lasting folliculitis and folliculitis that keeps coming back can be caused by bacteria that live in the nostrils. What increases the risk? This condition is more likely to develop in people with:  A weakened immune system.  Diabetes.  Obesity.  What are the signs or symptoms? Symptoms of this condition include:  Redness.  Soreness.  Swelling.  Itching.  Small white or yellow, pus-filled, itchy spots (pustules) that appear over a reddened area. If there is an infection that goes deep into the follicle, these may develop into a boil (furuncle).  A group of closely packed boils (carbuncle). These tend to form in hairy, sweaty areas of the body.  How is this diagnosed? This condition is diagnosed with a skin exam. To find what is causing the condition, your health care provider may take a sample of one of the pustules or boils for testing. How is this treated? This condition may be treated by:  Applying warm compresses to the affected areas.  Taking an antibiotic medicine or applying an antibiotic medicine to the skin.  Applying or bathing with an antiseptic solution.  Taking an over-the-counter medicine to help with itching.  Having a procedure to drain any pustules or boils. This may be done if a pustule or boil contains a lot of pus or fluid.  Laser hair removal. This may be done to treat long-lasting  folliculitis.  Follow these instructions at home:  If directed, apply heat to the affected area as often as told by your health care provider. Use the heat source that your health care provider recommends, such as a moist heat pack or a heating pad. ? Place a towel between your skin and the heat source. ? Leave the heat on for 20-30 minutes. ? Remove the heat if your skin turns bright red. This is especially important if you are unable to feel pain, heat, or cold. You may have a greater risk of getting burned.  If you were prescribed an antibiotic medicine, use it as told by your health care provider. Do not stop using the antibiotic even if you start to feel better.  Take over-the-counter and prescription medicines only as told by your health care provider.  Do not shave irritated skin.  Keep all follow-up visits as told by your health care provider. This is important. Get help right away if:  You have more redness, swelling, or pain in the affected area.  Red streaks are spreading from the affected area.  You have a fever. This information is not intended to replace advice given to you by your health care provider. Make sure you discuss any questions you have with your health care provider. Document Released: 11/15/2001 Document Revised: 03/26/2016 Document Reviewed: 06/27/2015 Elsevier Interactive Patient Education  2018 Elsevier Inc.   General Headache Without Cause A headache is pain or discomfort felt around the head or neck area. There are many causes and types of headaches. In some cases, the cause may not be found.  Follow these instructions at home: Managing pain  Take over-the-counter and prescription medicines only as told by your doctor.  Lie down in a dark, quiet room when you have a headache.  If directed, apply ice to the head and neck area: ? Put ice in a plastic bag. ? Place a towel between your skin and the bag. ? Leave the ice on for 20 minutes, 2-3 times  per day.  Use a heating pad or hot shower to apply heat to the head and neck area as told by your doctor.  Keep lights dim if bright lights bother you or make your headaches worse. Eating and drinking  Eat meals on a regular schedule.  Lessen how much alcohol you drink.  Lessen how much caffeine you drink, or stop drinking caffeine. General instructions  Keep all follow-up visits as told by your doctor. This is important.  Keep a journal to find out if certain things bring on headaches. For example, write down: ? What you eat and drink. ? How much sleep you get. ? Any change to your diet or medicines.  Relax by getting a massage or doing other relaxing activities.  Lessen stress.  Sit up straight. Do not tighten (tense) your muscles.  Do not use tobacco products. This includes cigarettes, chewing tobacco, or e-cigarettes. If you need help quitting, ask your doctor.  Exercise regularly as told by your doctor.  Get enough sleep. This often means 7-9 hours of sleep. Contact a doctor if:  Your symptoms are not helped by medicine.  You have a headache that feels different than the other headaches.  You feel sick to your stomach (nauseous) or you throw up (vomit).  You have a fever. Get help right away if:  Your headache becomes really bad.  You keep throwing up.  You have a stiff neck.  You have trouble seeing.  You have trouble speaking.  You have pain in the eye or ear.  Your muscles are weak or you lose muscle control.  You lose your balance or have trouble walking.  You feel like you will pass out (faint) or you pass out.  You have confusion. This information is not intended to replace advice given to you by your health care provider. Make sure you discuss any questions you have with your health care provider. Document Released: 06/15/2008 Document Revised: 02/12/2016 Document Reviewed: 12/30/2014 Elsevier Interactive Patient Education  [employer]2018 Elsevier  Inc.

## 2018-08-14 ENCOUNTER — Encounter: Payer: Self-pay | Admitting: Family Medicine

## 2018-08-14 ENCOUNTER — Ambulatory Visit: Payer: Medicaid Other | Admitting: Family Medicine

## 2018-08-14 VITALS — BP 110/78 | HR 73 | Temp 98.0°F | Ht 61.0 in | Wt 120.4 lb

## 2018-08-14 DIAGNOSIS — J069 Acute upper respiratory infection, unspecified: Secondary | ICD-10-CM

## 2018-08-14 DIAGNOSIS — F53 Postpartum depression: Secondary | ICD-10-CM

## 2018-08-14 DIAGNOSIS — O99345 Other mental disorders complicating the puerperium: Secondary | ICD-10-CM | POA: Diagnosis not present

## 2018-08-14 MED ORDER — SERTRALINE HCL 50 MG PO TABS: 50.0000 mg | ORAL_TABLET | Freq: Every day | ORAL | 3 refills | Status: DC

## 2018-08-14 NOTE — Progress Notes (Signed)
Pre visit review using our clinic review tool, if applicable. No additional management support is needed unless otherwise documented below in the visit note. 

## 2018-08-14 NOTE — Patient Instructions (Signed)
OK to use Debrox (peroxide) in the ear to loosen up wax. Also recommend using a bulb syringe (for removing boogers from baby's noses) to flush through warm water. Do not use Q-tips as this can impact wax further.  Continue to push fluids, practice good hand hygiene, and cover your mouth if you cough.  If you start having fevers, shaking or shortness of breath, seek immediate care.  For symptoms, consider using Vick's VapoRub on chest or under nose, air humidifier, Benadryl at night, and elevating the head of the bed. Tylenol and ibuprofen for aches and pains you may be experiencing.   Please consider counseling. Contact [phone] to schedule an appointment or inquire about cost/insurance coverage.  Coping skills Choose 5 that work for you:  Take a deep breath  Count to 20  Read a book  Do a puzzle  Meditate  Bake  Sing  Knit  Garden  Pray  Go outside  Call a friend  Listen to music  Take a walk  Color  Send a note  Take a bath  Watch a movie  Be alone in a quiet place  Pet an animal  Visit a friend  Journal  Exercise  Stretch   Let us know if you need anything.

## 2018-08-14 NOTE — Progress Notes (Signed)
Chief Complaint  Patient presents with  . Depression    Subjective Tammy Mejia is an 27 y.o. female who presents with anxiety/depression Symptoms began 2 weeks ago. Anxiety symptoms: difficulty concentrating, insomnia, irritable. Depressive symptoms depressed mood, anhedonia, crying spells. Just had child.  She is currently being treated with- none. She is not following with a psychologist.  Duration: 1 week  Associated symptoms: sinus congestion, rhinorrhea, ear pain, sore throat and cough Denies: sinus pain, itchy watery eyes, ear drainage, wheezing, shortness of breath, myalgia and fevers Treatment to date: None Sick contacts: Yes at son's doc appt    Past Medical History:  Diagnosis Date  . Anemia   . Anxiety   . 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 Back pain   . Deficiency of potassium   . Depression   . HPV in female   . Hypokalemia   . Migraine   . PID (pelvic inflammatory disease)     Medications Current Outpatient Medications on File Prior to Visit  Medication Sig Dispense Refill  . chlorhexidine (HIBICLENS) 4 % external liquid Apply topically every other day. 120 mL 0  . Fe Fum-FePoly-Vit C-Vit B3 (INTEGRA) 62.5-62.5-40-3 MG CAPS Take 1 capsule by mouth 2 (two) times daily. 60 capsule 2  . loratadine (CLARITIN) 10 MG tablet Take 1 tablet (10 mg total) by mouth daily. 30 tablet 0  . Prenatal Vit-Fe Fumarate-FA (MULTIVITAMIN-PRENATAL) 27-0.8 MG TABS tablet Take 1 tablet by mouth daily at 12 noon.    . saccharomyces boulardii (FLORASTOR) 250 MG capsule Take 1 capsule (250 mg total) by mouth 2 (two) times daily.      Allergies No Known Allergies  Family History Family History  Problem Relation Age of Onset  . COPD Mother   . Diabetes Father   . Heart disease Father   . Cancer Neg Hx   . Hypertension Neg Hx   . Stroke Neg Hx      Review Of Systems Cardiovascular:  no chest pain, no palpitations Psychiatric: as noted in  HPI  Exam BP 110/78 (BP Location: Left Arm, Patient Position: Sitting, Cuff Size: Normal)   Pulse 73   Temp 98 F (36.7 C) (Oral)   Ht 5\' 1"  (1.549 m)   Wt 120 lb 6 oz (54.6 kg)   LMP 10/10/2017 (Exact Date)   SpO2 96%   BMI 22.74 kg/m  General:  well developed, well nourished, in no apparent distress HEENT: Ears negative bilaterally, nares patent, septal deviation noted, pharynx with erythematous cobblestoning, no exudate, eyes negative Neck: neck supple without adenopathy, thyromegaly, or masses Lungs:  clear to auscultation, breath sounds equal bilaterally, normal respiratory effort without accessory muscle use Cardio:  regular rate and rhythm without murmurs Neuro:  deep tendon reflexes normal and symmetric and no cerebellar signs or ataxia noted Psych: well oriented with normal range of affect and age-appropriate judgement/insight  Assessment and Plan  Post partum depression - Plan: sertraline (ZOLOFT) 50 MG tablet  Upper respiratory tract infection, unspecified type  Start Zoloft 25 mg daily for 2 weeks and then 50 mg daily.  Counseled on adjunctive treatment with exercise/physical activity. Number for [facility] provided in AVS. Take Claritin daily.  With a productive cough, I do not want to call in a cough suppressant.  Discussed the necessary use of antibiotics.  Let me know if anything changes. F/u in 6 weeks. Patient voiced understanding and agreement to the plan.  Jilda Roche [place], DO  08/14/18 4:32 PM

## 2018-08-18 IMAGING — US US OB < 14 WEEKS - US OB TV
1 series · 14 of 28 positions shown · non-contrast
Comparison: None.

CLINICAL DATA: Pelvic pain and cramping beginning yesterday.
Gestational age by LMP of 6 weeks 0 days. Recent missed abortion.

EXAM:
OBSTETRIC <14 WK US AND TRANSVAGINAL OB US
TECHNIQUE: Both transabdominal and transvaginal ultrasound examinations were
performed for complete evaluation of the gestation as well as the
maternal uterus, adnexal regions, and pelvic cul-de-sac.
Transvaginal technique was performed to assess early pregnancy.

[Series 1: us ob < 14 weeks - us ob tv · 0.18mm/px · 14 of 96 slices shown]
[im 4/96]
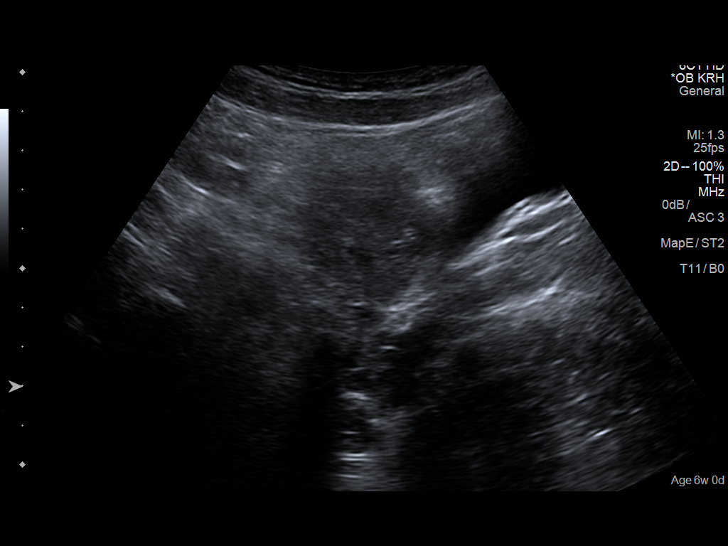
[im 11/96]
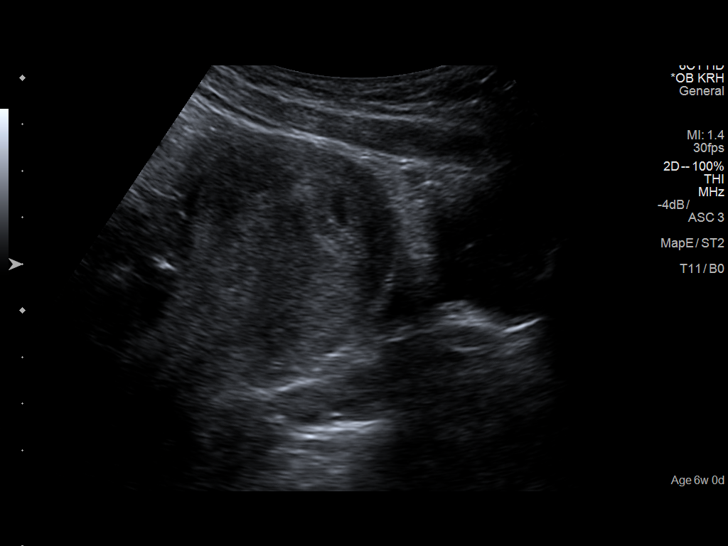
[im 18/96]
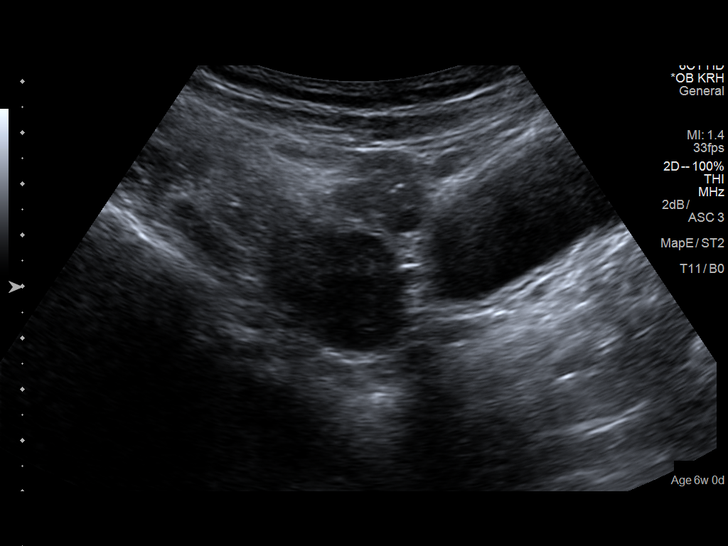
[im 25/96]
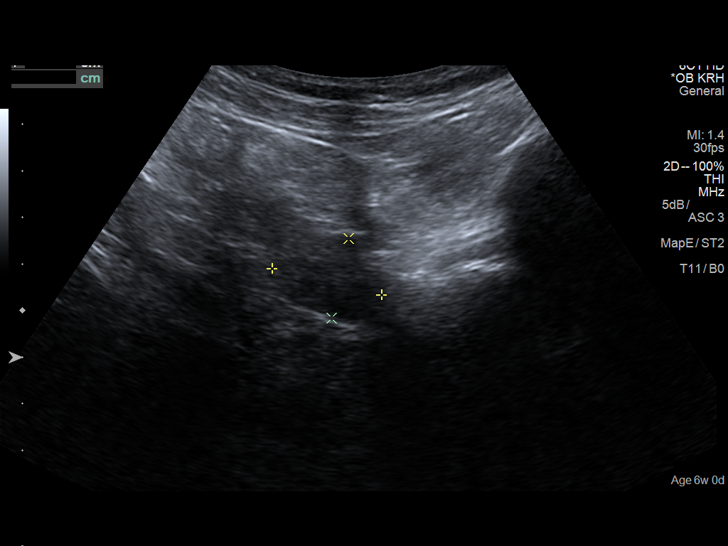
[im 32/96]
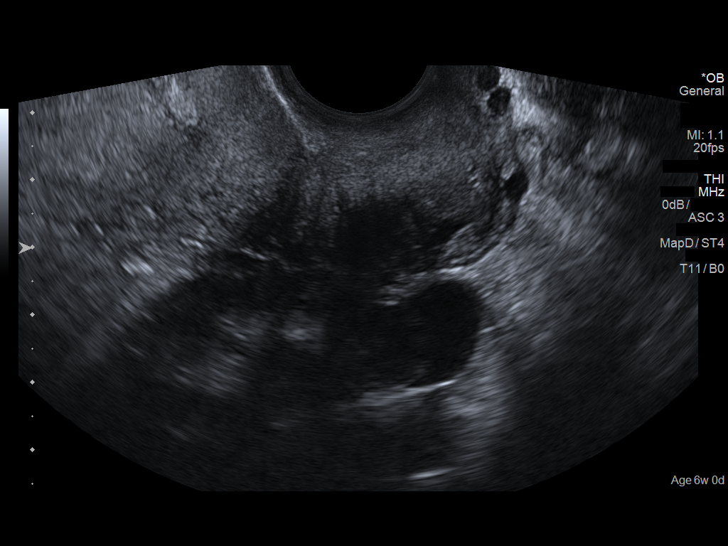
[im 39/96]
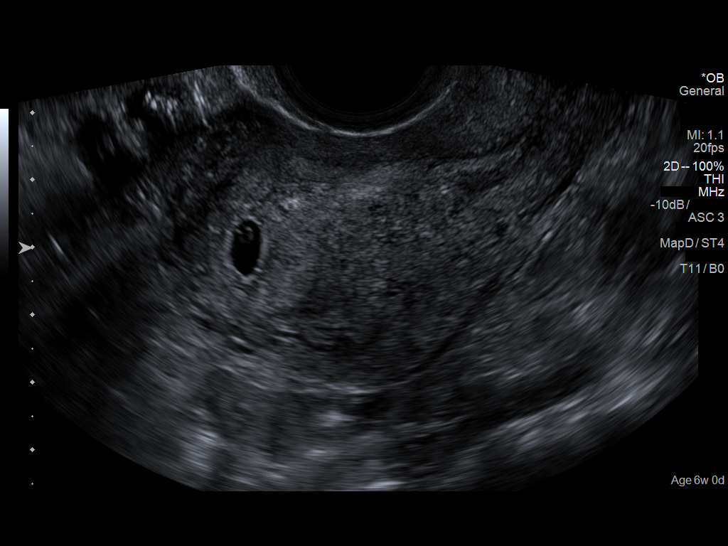
[im 46/96]
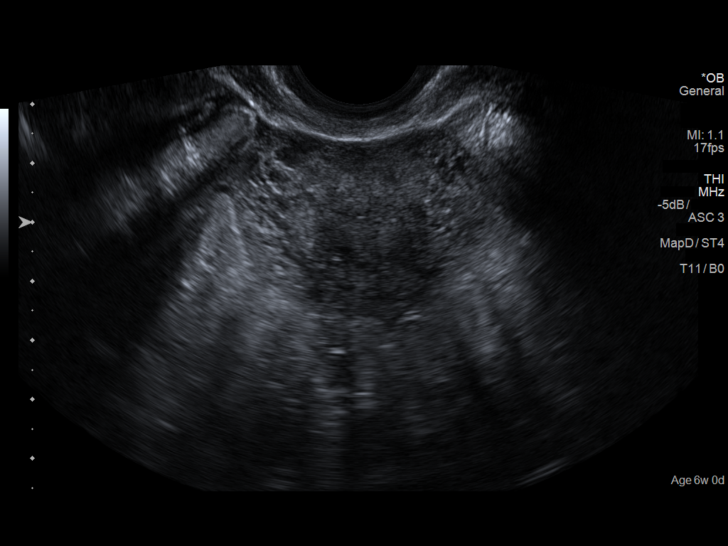
[im 53/96]
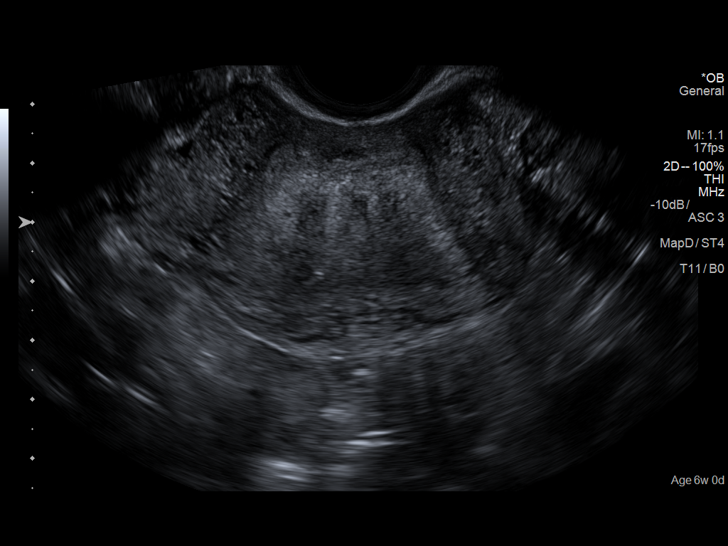
[im 60/96]
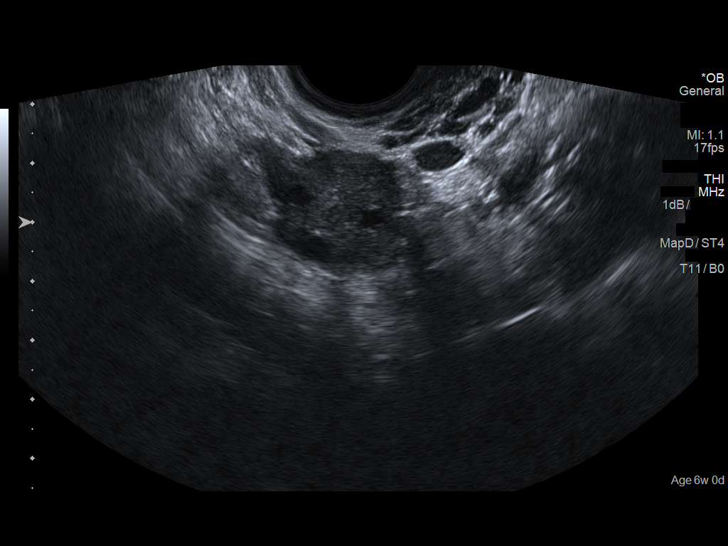
[im 67/96]
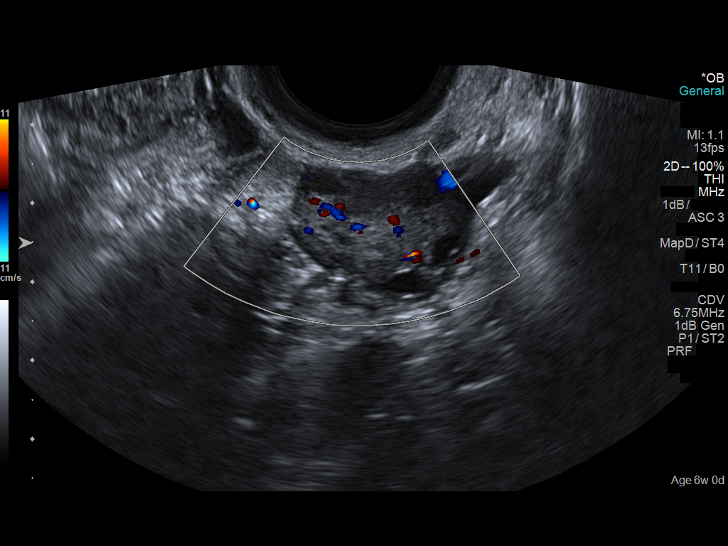
[im 74/96]
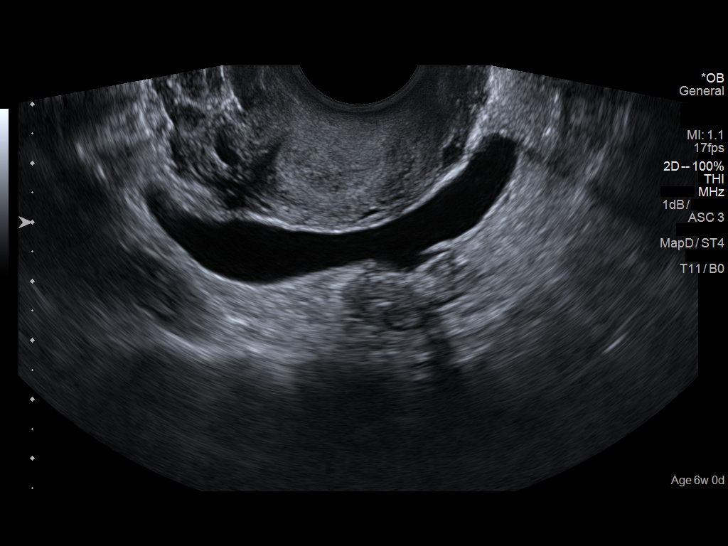
[im 81/96]
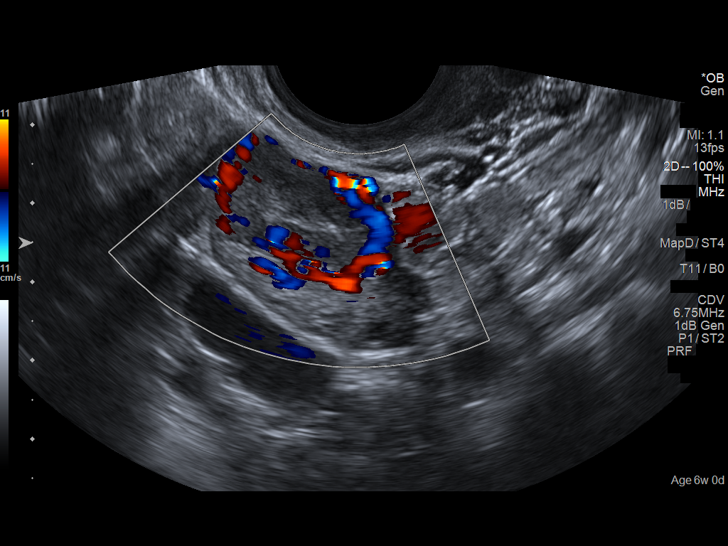
[im 88/96]
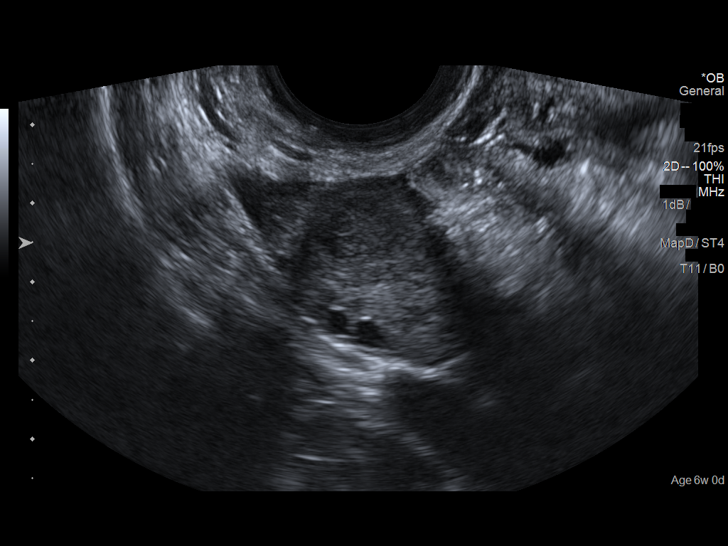
[im 96/96]
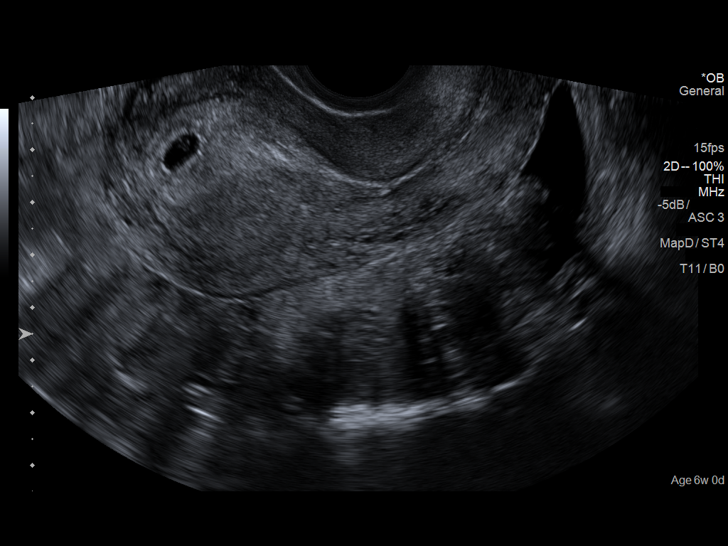

[14 of 28 positions shown; findings below may reference images not displayed]

FINDINGS: Intrauterine gestational sac: Single

Yolk sac:  Visualized.

Embryo:  Not Visualized.

MSD: 7 mm   5 w   3 d

Subchorionic hemorrhage:  None visualized.

Maternal uterus/adnexae: Small right ovarian corpus luteum noted.
Normal appearance of left ovary. No adnexal mass or abnormal free
fluid identified.
IMPRESSION: Single intrauterine gestational sac measuring 5 weeks 3 days by mean
sac diameter. Consider following serial b-hCG levels, with followup
ultrasound to assess viability in 10-14 days.

No significant maternal uterine or adnexal abnormality identified.

## 2018-08-24 ENCOUNTER — Ambulatory Visit (INDEPENDENT_AMBULATORY_CARE_PROVIDER_SITE_OTHER): Payer: Medicaid Other | Admitting: Family Medicine

## 2018-08-24 ENCOUNTER — Encounter: Payer: Self-pay | Admitting: Family Medicine

## 2018-08-24 VITALS — Ht 61.0 in

## 2018-08-24 DIAGNOSIS — Z3042 Encounter for surveillance of injectable contraceptive: Secondary | ICD-10-CM | POA: Diagnosis not present

## 2018-08-24 DIAGNOSIS — Z01812 Encounter for preprocedural laboratory examination: Secondary | ICD-10-CM

## 2018-08-24 DIAGNOSIS — F329 Major depressive disorder, single episode, unspecified: Secondary | ICD-10-CM

## 2018-08-24 DIAGNOSIS — Z3202 Encounter for pregnancy test, result negative: Secondary | ICD-10-CM

## 2018-08-24 DIAGNOSIS — R87610 Atypical squamous cells of undetermined significance on cytologic smear of cervix (ASC-US): Secondary | ICD-10-CM

## 2018-08-24 DIAGNOSIS — F419 Anxiety disorder, unspecified: Secondary | ICD-10-CM

## 2018-08-24 DIAGNOSIS — R87810 Cervical high risk human papillomavirus (HPV) DNA test positive: Secondary | ICD-10-CM

## 2018-08-24 LAB — POCT URINE PREGNANCY: Preg Test, Ur: NEGATIVE

## 2018-08-24 MED ORDER — MEDROXYPROGESTERONE ACETATE 150 MG/ML IM SUSP
150.0000 mg | Freq: Once | INTRAMUSCULAR | Status: AC
  Administered 2018-08-24: 150 mg via INTRAMUSCULAR

## 2018-08-24 NOTE — Progress Notes (Signed)
Post Partum Exam  Tammy Mejia is a 27 y.o. [id number] female who presents for a postpartum visit. She is 5 weeks postpartum following a spontaneous vaginal delivery. I have fully reviewed the prenatal and intrapartum course. The delivery was at 39.1 gestational weeks.  Anesthesia: epidural. Postpartum course has been unventful. Baby's course has been monitored for poor weight gain. Baby is feeding by bottle - Gerber goodstart Soothe. Bleeding no bleeding. Bowel function is normal. Bladder function is normal. Patient is not sexually active. Contraception method is IUD. Postpartum depression screening:Positive score 18.   Patient saw PCP and restarted on Zoloft.  The following portions of the patient's history were reviewed and updated as appropriate: allergies, current medications, past family history, past medical history, past social history, past surgical history and problem list. Last pap smear done 12-05-17 and was Abnormal- ASCUS- HPV+  Review of Systems Pertinent items are noted in HPI.    Objective:  Last menstrual period 10/10/2017, unknown if currently breastfeeding.  General:  alert, cooperative and no distress  Lungs: clear to auscultation bilaterally  Heart:  regular rate and rhythm, S1, S2 normal, no murmur, click, rub or gallop  Abdomen: soft, non-tender; bowel sounds normal; no masses,  no organomegaly   Vulva:  normal  Vagina: normal vagina, no discharge, exudate, lesion, or erythema        Assessment:    normal postpartum exam.   Plan:   1. Contraception: Depo-Provera injections today.  2. Patient needs rpt colpo due to ASCUS +HPV. Will schedule for colposcopy and will place IUD at that appt 3. Follow up in: 6 weeks or as needed.

## 2018-09-06 ENCOUNTER — Encounter: Payer: Self-pay | Admitting: Family Medicine

## 2018-09-06 ENCOUNTER — Ambulatory Visit (HOSPITAL_BASED_OUTPATIENT_CLINIC_OR_DEPARTMENT_OTHER)
Admission: RE | Admit: 2018-09-06 | Discharge: 2018-09-06 | Disposition: A | Discharge status: Home / Self Care | Payer: Medicaid Other | Source: Ambulatory Visit | Attending: Family Medicine | Admitting: Family Medicine

## 2018-09-06 ENCOUNTER — Ambulatory Visit: Payer: Medicaid Other | Admitting: Family Medicine

## 2018-09-06 VITALS — BP 110/76 | HR 95 | Temp 98.2°F | Ht 61.0 in | Wt 118.1 lb

## 2018-09-06 DIAGNOSIS — R1032 Left lower quadrant pain: Secondary | ICD-10-CM

## 2018-09-06 LAB — POC URINALSYSI DIPSTICK (AUTOMATED)
Bilirubin, UA: NEGATIVE
Blood, UA: NEGATIVE
Glucose, UA: NEGATIVE
KETONES UA: NEGATIVE
Nitrite, UA: NEGATIVE
PH UA: 6 (ref 5.0–8.0)
PROTEIN UA: NEGATIVE
Spec Grav, UA: 1.01 (ref 1.010–1.025)
Urobilinogen, UA: 0.2 E.U./dL

## 2018-09-06 MED ORDER — MELOXICAM 15 MG PO TABS: 15.0000 mg | ORAL_TABLET | Freq: Every day | ORAL | 0 refills | Status: DC

## 2018-09-06 MED FILL — MELOXICAM 15 MG TABLET: 15 | 30 days supply | Qty: 30 | Fill #0

## 2018-09-06 NOTE — Progress Notes (Signed)
Pre visit review using our clinic review tool, if applicable. No additional management support is needed unless otherwise documented below in the visit note. 

## 2018-09-06 NOTE — Addendum Note (Signed)
Addended by: Scharlene GlossEWING, ROBIN B on: 09/06/2018 02:55 PM   Modules accepted: Orders

## 2018-09-06 NOTE — Patient Instructions (Addendum)
Heat (pad or rice pillow in microwave) over affected area, 10-15 minutes twice daily.   Ice/cold pack over area for 10-15 min twice daily..  OK to take Tylenol 1000 mg (2 extra strength tabs) or 975 mg (3 regular strength tabs) every 6 hours as needed.  No anti-inflammatories with Mobic.   We will be in touch regarding your results. Assume no news is good news. If all is normal, we will order an ultrasound for more information.   Let us know if you need anything.

## 2018-09-06 NOTE — Progress Notes (Signed)
Chief Complaint  Patient presents with  . Abdominal Pain    Subjective: Patient is a 27 y.o. female here for abd pain.  2 weeks ago it started. Has tried heat and Tylenol which were not helpful. Nothing makes it better, eating makes it worse. No inj or change in activity. Bowel movements have been normal. It does wake her up at night. +nausea. Denies fevers, bleeding, urinary complaints, vag discharge.   ROS: Abd: +pain  Past Medical History:  Diagnosis Date  . Anemia   . Anxiety   . 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 Back pain   . Deficiency of potassium   . Depression   . HPV in female   . Hypokalemia   . Migraine   . PID (pelvic inflammatory disease)     Objective: BP 110/76 (BP Location: Left Arm, Patient Position: Sitting, Cuff Size: Normal)   Pulse 95   Temp 98.2 F (36.8 C) (Oral)   Ht 5\' 1"  (1.549 m)   Wt 118 lb 2 oz (53.6 kg)   SpO2 96%   BMI 22.32 kg/m  General: Awake, appears stated age HEENT: MMM, EOMi Abd: BS+, soft, TTP in LLQ and suprapubic region, ND MSK: No ttp over obliques Heart: RRR, no murmurs Lungs: CTAB, no rales, wheezes or rhonchi. No accessory muscle use Psych: Age appropriate judgment and insight, normal affect and mood  Assessment and Plan: Left lower quadrant abdominal pain - Plan: meloxicam (MOBIC) 15 MG tablet, DG Abd 2 Views  Orders as above. Ck UA also. If both neg, will get a TV US.  F/u prn.  The patient voiced understanding and agreement to the plan.  Jilda Rocheicholas Paul [place], DO 09/06/18  2:34 PM

## 2018-09-07 LAB — URINE CULTURE
MICRO NUMBER:: 91515007
SPECIMEN QUALITY: ADEQUATE

## 2018-09-13 ENCOUNTER — Encounter: Payer: Self-pay | Admitting: Family Medicine

## 2018-09-25 ENCOUNTER — Ambulatory Visit: Payer: Medicaid Other | Admitting: Family Medicine

## 2018-09-25 ENCOUNTER — Encounter: Payer: Self-pay | Admitting: Family Medicine

## 2018-09-25 VITALS — BP 102/62 | HR 103 | Temp 98.1°F | Ht 61.0 in | Wt 117.2 lb

## 2018-09-25 DIAGNOSIS — M419 Scoliosis, unspecified: Secondary | ICD-10-CM

## 2018-09-25 DIAGNOSIS — O99345 Other mental disorders complicating the puerperium: Secondary | ICD-10-CM | POA: Diagnosis not present

## 2018-09-25 DIAGNOSIS — F53 Postpartum depression: Secondary | ICD-10-CM

## 2018-09-25 NOTE — Progress Notes (Signed)
Chief Complaint  Patient presents with  . Follow-up    Subjective Tammy Mejia presents for f/u anxiety/depression.  She is currently being treated with Zoloft 50 mg/d.  Reports some improvement since treatment, though has only taken for 1 week due to forgetting it.  No thoughts of harming self or others. No self-medication with alcohol, prescription drugs or illicit drugs. Pt is not following with a counselor/psychologist.  Also has questions about what scoliosis is. Found incidentally on an XR of abd, for pain that has since resolved.   ROS Psych: No homicidal or suicidal thoughts  Past Medical History:  Diagnosis Date  . Anemia   . Anxiety   . 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 Back pain   . Deficiency of potassium   . Depression   . HPV in female   . Hypokalemia   . Migraine   . PID (pelvic inflammatory disease)    Exam BP 102/62 (BP Location: Left Arm, Patient Position: Sitting, Cuff Size: Normal)   Pulse (!) 103   Temp 98.1 F (36.7 C) (Oral)   Ht 5\' 1"  (1.549 m)   Wt 117 lb 4 oz (53.2 kg)   SpO2 97%   BMI 22.15 kg/m  General:  well developed, well nourished, in no apparent distress Lungs:  clear to auscultation, breath sounds equal bilaterally, no respiratory distress Cardio:  regular rate and rhythm without murmurs, heart sounds without clicks or rubs Psych: well oriented with normal range of affect and age-appropriate judgement/insight, alert and oriented x4.  Assessment and Plan  Post partum depression  Scoliosis of thoracolumbar spine, unspecified scoliosis type  Cont Zoloft, take daily. F/u in 5 weeks. Discussed scoliosis and prognosis. Likely do not need to do anything at this time given asymptomatic presentation.  The patient voiced understanding and agreement to the plan.  Tammy Roche [place], DO 09/25/18 2:00 PM

## 2018-09-25 NOTE — Progress Notes (Signed)
Pre visit review using our clinic review tool, if applicable. No additional management support is needed unless otherwise documented below in the visit note. 

## 2018-09-25 NOTE — Patient Instructions (Signed)
Take medicine daily until we see each other next.  Keep the diet clean and stay active.  Let us know if you need anything.

## 2018-10-05 ENCOUNTER — Encounter: Payer: Self-pay | Admitting: Family Medicine

## 2018-10-05 ENCOUNTER — Ambulatory Visit (INDEPENDENT_AMBULATORY_CARE_PROVIDER_SITE_OTHER): Payer: Medicaid Other | Admitting: Family Medicine

## 2018-10-05 ENCOUNTER — Other Ambulatory Visit (HOSPITAL_COMMUNITY)
Admission: RE | Admit: 2018-10-05 | Discharge: 2018-10-05 | Disposition: A | Discharge status: Home / Self Care | Payer: Medicaid Other | Source: Ambulatory Visit | Attending: Family Medicine | Admitting: Family Medicine

## 2018-10-05 VITALS — BP 100/76 | HR 79 | Ht 61.0 in | Wt 119.0 lb

## 2018-10-05 VITALS — BP 102/68 | HR 54 | Temp 98.4°F | Ht 61.0 in | Wt 119.4 lb

## 2018-10-05 DIAGNOSIS — L02429 Furuncle of limb, unspecified: Secondary | ICD-10-CM | POA: Diagnosis not present

## 2018-10-05 DIAGNOSIS — R87810 Cervical high risk human papillomavirus (HPV) DNA test positive: Secondary | ICD-10-CM | POA: Insufficient documentation

## 2018-10-05 DIAGNOSIS — Z30430 Encounter for insertion of intrauterine contraceptive device: Secondary | ICD-10-CM | POA: Diagnosis not present

## 2018-10-05 DIAGNOSIS — R87610 Atypical squamous cells of undetermined significance on cytologic smear of cervix (ASC-US): Secondary | ICD-10-CM | POA: Diagnosis present

## 2018-10-05 LAB — POCT URINE PREGNANCY: PREG TEST UR: NEGATIVE

## 2018-10-05 MED ORDER — CEPHALEXIN 500 MG PO CAPS: 500.0000 mg | ORAL_CAPSULE | Freq: Four times a day (QID) | ORAL | 0 refills | Status: AC

## 2018-10-05 MED ORDER — LEVONORGESTREL 19.5 MCG/DAY IU IUD
INTRAUTERINE_SYSTEM | Freq: Once | INTRAUTERINE | Status: AC
  Administered 2018-10-05: 1 via INTRAUTERINE

## 2018-10-05 NOTE — Progress Notes (Signed)
Patient Name: Tammy Mejia, female   DOB: [birth year], 28 y.o.  MRN: [id number]  Colposcopy Procedure Note:  [id number] Pregnancy status: non-pregnant Indications: ASCUS, +HR HPV HPV:  Positive Cervical History:  Previous Abnormal Pap: none  Previous Colposcopy: benign  Previous LEEP or Cryo:   Smoking: Never Smoked Hysterectomy: No   Patient given informed consent, signed copy in the chart, time out was performed.    Exam: Vulva and Vagina grossly normal.  Cervix viewed with speculum and colposcope after application of acetic acid:  Cervix Fully Visualized Squamocolumnar Junction Visibility: Fully visualized  Acetowhite lesions: none Other Lesions: None Punctation: Not present  Mosaicism: Not present Abnormal vasculature: No   Biopsies: none  ECC: yes  Hemostasis achieved with:  Silver Nitrate  Colposcopy Impression:  Benign   Patient was given post procedure instructions.  Will call patient with results.

## 2018-10-05 NOTE — Progress Notes (Signed)
Pre visit review using our clinic review tool, if applicable. No additional management support is needed unless otherwise documented below in the visit note. 

## 2018-10-05 NOTE — Progress Notes (Signed)
IUD Procedure Note Patient identified, informed consent performed, signed copy in chart, time out was performed.  Urine pregnancy test negative.  Speculum placed in the vagina.  Cervix visualized.  Cleaned with Betadine x 2.  Grasped anteriorly with a single tooth tenaculum.  Uterus sounded to 9 cm.  Liletta  IUD placed per manufacturer's recommendations.  Strings trimmed to 3 cm. Tenaculum was removed, good hemostasis noted.  Patient tolerated procedure well.   Patient given post procedure instructions and Liletta care card with expiration date.  Patient is asked to check IUD strings periodically and follow up in 4-6 weeks for IUD check.  

## 2018-10-05 NOTE — Patient Instructions (Signed)

## 2018-10-05 NOTE — Addendum Note (Signed)
Addended by: Lorelle Gibbs L on: 10/05/2018 10:50 AM   Modules accepted: Orders

## 2018-10-05 NOTE — Patient Instructions (Signed)
Things to look out for: increasing pain not relieved by ibuprofen/acetaminophen, ice, fevers, spreading redness, drainage of pus, or foul odor.  Let us know if you need anything.

## 2018-10-05 NOTE — Progress Notes (Signed)
Chief Complaint  Patient presents with  . Ankle Pain    bug bite    Tammy Mejia is a 28 y.o. female here for a skin complaint.  Duration: 2 days Location: anterior L leg, distal Pruritic? No Painful? Yes Drainage? No Other associated symptoms: none Therapies tried thus far: heat, TAO  ROS:  Const: No fevers Skin: As noted in HPI  Past Medical History:  Diagnosis Date  . Anemia   . Anxiety   . 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 Back pain   . Deficiency of potassium   . Depression   . HPV in female   . Hypokalemia   . Migraine   . PID (pelvic inflammatory disease)     BP 102/68 (BP Location: Left Arm, Patient Position: Sitting, Cuff Size: Normal)   Pulse (!) 54   Temp 98.4 F (36.9 C) (Oral)   Ht 5\' 1"  (1.549 m)   Wt 119 lb 6 oz (54.1 kg)   LMP 09/18/2018   SpO2 97%   BMI 22.56 kg/m  Gen: awake, alert, appearing stated age Lungs: No accessory muscle use Skin: See below. No drainage, fluctuance, drainage; + erythema, +TTP Psych: Age appropriate judgment and insight   +LLE  Furuncle of lower leg - Plan: cephALEXin (KEFLEX) 500 MG capsule  Orders as above. 7 d. Warning signs and symptoms verbalized and written down in AVS.  F/u prn. The patient voiced understanding and agreement to the plan.  Jilda Roche [place], DO 10/05/18 11:17 AM

## 2018-10-10 MED ORDER — DICLOFENAC SODIUM 75 MG PO TBEC: 75.0000 mg | DELAYED_RELEASE_TABLET | Freq: Two times a day (BID) | ORAL | 2 refills | Status: DC

## 2018-10-10 MED FILL — DICLOFENAC SODIUM 75 MG TAB: 75 | 30 days supply | Qty: 60 | Fill #0

## 2018-10-10 NOTE — Addendum Note (Signed)
Addended by: Levie Heritage on: 10/10/2018 04:33 PM   Modules accepted: Orders

## 2018-10-16 ENCOUNTER — Telehealth: Payer: Self-pay

## 2018-10-16 ENCOUNTER — Encounter: Payer: Self-pay | Admitting: Family Medicine

## 2018-10-16 NOTE — Telephone Encounter (Signed)
Patient called stating that she got the IUD a few weeks ago and today noticed a patch of red dots on her arm.  Encouraged the patient that it is most likely not related to the IUD as the IUD is localized effect not systemic.   Questioned if she is using new soap or detergent and patient states no. Patient agreeable with suggestions. Armandina Stammer RN

## 2018-11-02 ENCOUNTER — Ambulatory Visit: Payer: Self-pay | Admitting: Family Medicine

## 2018-11-06 ENCOUNTER — Ambulatory Visit: Payer: Self-pay | Admitting: Family Medicine

## 2018-11-09 ENCOUNTER — Ambulatory Visit: Payer: Self-pay | Admitting: Family Medicine

## 2018-11-09 DIAGNOSIS — Z30431 Encounter for routine checking of intrauterine contraceptive device: Secondary | ICD-10-CM

## 2018-11-22 ENCOUNTER — Ambulatory Visit: Payer: Self-pay | Admitting: Family Medicine

## 2018-11-23 ENCOUNTER — Encounter: Payer: Self-pay | Admitting: Family Medicine

## 2018-11-23 ENCOUNTER — Ambulatory Visit (INDEPENDENT_AMBULATORY_CARE_PROVIDER_SITE_OTHER): Payer: Self-pay | Admitting: Family Medicine

## 2018-11-23 VITALS — BP 98/80 | HR 84 | Temp 97.9°F | Resp 18 | Ht 61.0 in | Wt 122.6 lb

## 2018-11-23 DIAGNOSIS — F32A Depression, unspecified: Secondary | ICD-10-CM

## 2018-11-23 DIAGNOSIS — F419 Anxiety disorder, unspecified: Secondary | ICD-10-CM

## 2018-11-23 DIAGNOSIS — F329 Major depressive disorder, single episode, unspecified: Secondary | ICD-10-CM

## 2018-11-23 DIAGNOSIS — R110 Nausea: Secondary | ICD-10-CM

## 2018-11-23 MED ORDER — ONDANSETRON HCL 4 MG PO TABS: 4.0000 mg | ORAL_TABLET | Freq: Three times a day (TID) | ORAL | 0 refills | Status: DC | PRN

## 2018-11-23 MED ORDER — SERTRALINE HCL 50 MG PO TABS: 50.0000 mg | ORAL_TABLET | Freq: Every day | ORAL | 5 refills | Status: DC

## 2018-11-23 NOTE — Progress Notes (Addendum)
Chief Complaint  Patient presents with  . Depression    Pt states feeling pretty good    Subjective Tammy Mejia presents for f/u anxiety/depression.  She is currently being treated with Zoloft 50 mg/d.  Reports improvement since treatment. No thoughts of harming self or others. No self-medication with alcohol, prescription drugs or illicit drugs. Pt is not following with a counselor/psychologist.  5 d has been having nausea. No pain, fevers, bleeding, bowel changes, sick contacts. Worse after meals, sitting up makes worse. Has not tried anything at home. She has IUD and does not think she is pregnant.   ROS Psych: No homicidal or suicidal thoughts  Past Medical History:  Diagnosis Date  . Anemia   . Anxiety   . 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 Back pain   . Deficiency of potassium   . Depression   . HPV in female   . Hypokalemia   . Migraine   . PID (pelvic inflammatory disease)    Exam BP 98/80 (BP Location: Left Arm, Patient Position: Sitting, Cuff Size: Normal)   Pulse 84   Temp 97.9 F (36.6 C) (Oral)   Resp 18   Ht 5\' 1"  (1.549 m)   Wt 122 lb 9.6 oz (55.6 kg)   SpO2 98%   BMI 23.17 kg/m  General:  well developed, well nourished, in no apparent distress GI: BS+, S, NT, ND, neg Murphy's, Rovsing's, McBurney's, no masses or organomegaly HEENT: MMM Lungs:  no respiratory distress Cardio:  regular rate and rhythm without murmurs, heart sounds without clicks or rubs Psych: well oriented with normal range of affect and age-appropriate judgement/insight, alert and oriented x4.  Assessment and Plan  Anxiety and depression - Plan: sertraline (ZOLOFT) 50 MG tablet  Nausea - Plan: ondansetron (ZOFRAN) 4 MG tablet  Cont Zoloft. Symptomatic tx for nausea. Could be reflux, will tx for that if no improvement.  F/u as originally scheduled. The patient voiced understanding and agreement to the plan.  Jilda Roche [place],  DO 11/23/18 1:47 PM

## 2018-11-23 NOTE — Patient Instructions (Addendum)
Keep pushing fluids.  If no improvement, let me know. We will consider reflux as the next option.  Let us know if anything changes.  Let us know if you need anything.

## 2018-12-21 ENCOUNTER — Encounter: Payer: Self-pay | Admitting: Family Medicine

## 2018-12-22 ENCOUNTER — Ambulatory Visit (INDEPENDENT_AMBULATORY_CARE_PROVIDER_SITE_OTHER): Payer: Self-pay | Admitting: Family Medicine

## 2018-12-22 ENCOUNTER — Other Ambulatory Visit: Payer: Self-pay

## 2018-12-22 ENCOUNTER — Encounter: Payer: Self-pay | Admitting: Family Medicine

## 2018-12-22 DIAGNOSIS — G43719 Chronic migraine without aura, intractable, without status migrainosus: Secondary | ICD-10-CM

## 2018-12-22 MED ORDER — NARATRIPTAN HCL 2.5 MG PO TABS: ORAL_TABLET | ORAL | 0 refills | Status: DC

## 2018-12-22 NOTE — Progress Notes (Signed)
Virtual Visit via Telephone Note  I connected with Tammy Mejia on 12/22/18 at  2:15 PM EDT by telephone and verified that I am speaking with the correct person using two identifiers.   I discussed the limitations, risks, security and privacy concerns of performing an evaluation and management service by telephone and the availability of in person appointments. I also discussed with the patient that there may be a patient responsible charge related to this service. The patient expressed understanding and agreed to proceed.   History of Present Illness: 5 d migraine a/w photo/sonophobia, nausea. Hx of migraines. Has failed multiple triptans. No new s/s's.    Observations/Objective: No conversational dyspnea Age appropriate judgment and insight Nml affect and mood  Assessment and Plan: Intractable chronic migraine without aura and without status migrainosus - Plan: naratriptan (AMERGE) 2.5 MG tablet  Start new triptan. No NSAIDs, pt coming in for Toradol injection which typically does help.   Follow Up Instructions: PRN.   I discussed the assessment and treatment plan with the patient. The patient was provided an opportunity to ask questions and all were answered. The patient agreed with the plan and demonstrated an understanding of the instructions.   The patient was advised to call back or seek an in-person evaluation if the symptoms worsen or if the condition fails to improve as anticipated.  I provided 6 minutes of non-face-to-face time during this encounter.   Jilda Roche [place], DO

## 2019-02-24 DIAGNOSIS — R102 Pelvic and perineal pain: Secondary | ICD-10-CM

## 2019-03-01 ENCOUNTER — Encounter: Payer: Self-pay | Admitting: Obstetrics & Gynecology

## 2019-03-01 ENCOUNTER — Other Ambulatory Visit: Payer: Self-pay

## 2019-03-01 ENCOUNTER — Ambulatory Visit (INDEPENDENT_AMBULATORY_CARE_PROVIDER_SITE_OTHER): Payer: Self-pay | Admitting: Obstetrics & Gynecology

## 2019-03-01 VITALS — BP 106/69 | HR 61 | Wt 122.0 lb

## 2019-03-01 DIAGNOSIS — R87811 Vaginal high risk human papillomavirus (HPV) DNA test positive: Secondary | ICD-10-CM

## 2019-03-01 DIAGNOSIS — Z30432 Encounter for removal of intrauterine contraceptive device: Secondary | ICD-10-CM

## 2019-03-01 DIAGNOSIS — Z3009 Encounter for other general counseling and advice on contraception: Secondary | ICD-10-CM

## 2019-03-01 DIAGNOSIS — Z124 Encounter for screening for malignant neoplasm of cervix: Secondary | ICD-10-CM

## 2019-03-01 DIAGNOSIS — R87620 Atypical squamous cells of undetermined significance on cytologic smear of vagina (ASC-US): Secondary | ICD-10-CM

## 2019-03-01 MED ORDER — NORGESTIMATE-ETH ESTRADIOL 0.25-35 MG-MCG PO TABS: 1.0000 | ORAL_TABLET | Freq: Every day | ORAL | 11 refills | Status: DC

## 2019-03-01 MED FILL — PREVIFEM 0.25-35 MG-MCG TAB: 0.25-35 | 28 days supply | Qty: 28 | Fill #0

## 2019-03-01 NOTE — Progress Notes (Signed)
The patient reports that she has had pain since her IUD was placed and she wants it out. She has not had an [place] and says that she does not have the funds for it. She does not want to discus options to keep the IUD in place. She would like to take OCPs. Pt has an abnormal PAP 12/05/2017 57yr ago   Adequacy Satisfactory for evaluation endocervical/transformation zone component PRESENT.Abnormal    Diagnosis ATYPICAL SQUAMOUS CELLS OF UNDETERMINED SIGNIFICANCE (ASC-US).Abnormal    Chlamydia Negative   Comment: Normal Reference Range - Negative  Neisseria gonorrhea Negative   Comment: Normal Reference Range - Negative  HPV DETECTEDAbnormal    Comment: Normal Reference Range - NOT Detected  Material Submitted CervicoVaginal Pap [ThinPrep Imaged]Abnormal       Patient was in the dorsal lithotomy position, normal external genitalia was noted.  A speculum was placed in the patient's vagina, normal discharge was noted, no lesions. The multiparous cervix was visualized, no lesions, no abnormal discharge;  and the cervix was swabbed with Betadine using scopettes. The strings of the IUD were grasped and pulled using ring forceps.  The IUD was successfully removed in its entirety.  A pap was obtained of from her cervix.  Patient tolerated the procedure well.    Sprintec 1 po q day was Rx'd #1 RF x 12  Tawney Vanorman L. Harraway-Smith, M.D., FACOG]

## 2019-03-06 LAB — CYTOLOGY - PAP: Diagnosis: NEGATIVE

## 2019-04-10 ENCOUNTER — Encounter: Payer: Self-pay | Admitting: Family Medicine

## 2019-04-13 ENCOUNTER — Other Ambulatory Visit: Payer: Self-pay

## 2019-04-13 ENCOUNTER — Ambulatory Visit: Payer: Self-pay | Admitting: Family Medicine

## 2019-04-13 ENCOUNTER — Encounter: Payer: Self-pay | Admitting: Family Medicine

## 2019-04-13 VITALS — BP 108/70 | HR 96 | Temp 98.1°F | Ht 61.0 in | Wt 119.5 lb

## 2019-04-13 DIAGNOSIS — Z30013 Encounter for initial prescription of injectable contraceptive: Secondary | ICD-10-CM

## 2019-04-13 LAB — POCT URINE PREGNANCY: Preg Test, Ur: NEGATIVE

## 2019-04-13 MED ORDER — MEDROXYPROGESTERONE ACETATE 150 MG/ML IM SUSP
150.0000 mg | Freq: Once | INTRAMUSCULAR | Status: AC
  Administered 2019-04-13: 150 mg via INTRAMUSCULAR

## 2019-04-13 NOTE — Patient Instructions (Signed)
Let us know if you need anything.  No unprotected sex for 7 days.  Let us know if you need anything.

## 2019-04-13 NOTE — Progress Notes (Signed)
Chief Complaint  Patient presents with  . Contraception    Subjective: Patient is a 28 y.o. female here for contraception.  Transitioning from [facility].  Has been on shot before and did well. On OCP was bleeding. Has not had intercourse in >14 d. Had IUD prior to [facility], had a lot of cramping and it was pulled out.    ROS: GU: +vag bleeding  Past Medical History:  Diagnosis Date  . Anemia   . Anxiety   . 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 Back pain   . Deficiency of potassium   . Depression   . HPV in female   . Hypokalemia   . Migraine   . PID (pelvic inflammatory disease)     Objective: BP 108/70 (BP Location: Left Arm, Patient Position: Sitting, Cuff Size: Normal)   Pulse 96   Temp 98.1 F (36.7 C) (Oral)   Ht 5\' 1"  (1.549 m)   Wt 119 lb 8 oz (54.2 kg)   SpO2 99%   BMI 22.58 kg/m  General: Awake, appears stated age Heart: RRR, no murmurs Lungs: CTAB, no rales, wheezes or rhonchi. No accessory muscle use Psych: Age appropriate judgment and insight, normal affect and mood  Assessment and Plan: Encounter for initial prescription of injectable contraceptive - Plan: POCT urine pregnancy, medroxyPROGESTERone (DEPO-PROVERA) injection 150 mg, preg test neg. Will start q 3 mo injections. F/u in 6 mo for CPE, pt getting ins.  The patient voiced understanding and agreement to the plan.  [place], DO 04/13/19  5:05 PM

## 2019-05-01 ENCOUNTER — Encounter: Payer: Self-pay | Admitting: Family Medicine

## 2019-05-01 ENCOUNTER — Ambulatory Visit (INDEPENDENT_AMBULATORY_CARE_PROVIDER_SITE_OTHER): Payer: Self-pay | Admitting: Family Medicine

## 2019-05-01 ENCOUNTER — Other Ambulatory Visit: Payer: Self-pay

## 2019-05-01 DIAGNOSIS — A059 Bacterial foodborne intoxication, unspecified: Secondary | ICD-10-CM

## 2019-05-01 MED ORDER — DICYCLOMINE HCL 10 MG PO CAPS: 10.0000 mg | ORAL_CAPSULE | Freq: Three times a day (TID) | ORAL | 0 refills | Status: DC

## 2019-05-01 MED ORDER — ONDANSETRON HCL 4 MG PO TABS: 4.0000 mg | ORAL_TABLET | Freq: Three times a day (TID) | ORAL | 0 refills | Status: DC | PRN

## 2019-05-01 NOTE — Progress Notes (Signed)
CC: Nausea, vomiting  Subjective Tammy Mejia is a 28 y.o. female who presents with vomiting and nausea. Due to COVID-19 pandemic, we are interacting via web portal for an electronic face-to-face visit. I verified patient's ID using 2 identifiers. Patient agreed to proceed with visit via this method. Patient is at home, I am at office. Patient and I are present for visit.  Started last night. Patient has cramping, vomiting and nausea; started after she ate a pot pie at [facility]. Patient denies diarrhea, fever, headache, arthralgias, myalgias and URI symptoms Evaluation to date: no Sick contacts: none known  Past Medical History:  Diagnosis Date  . Anemia   . Anxiety   . 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 Back pain   . Deficiency of potassium   . Depression   . HPV in female   . Hypokalemia   . Migraine   . PID (pelvic inflammatory disease)    Review of Systems Constitutional:  No fevers   Exam No conversational dyspnea Age appropriate judgment and insight Nml affect and mood  Assessment and Plan  Food poisoning - Plan: ondansetron (ZOFRAN) 4 MG tablet, dicyclomine (BENTYL) 10 MG capsule  Orders as above. Avoid aggravating foods, discussed BRAT diet. F/u if symptoms fail to improve, sooner if worsening. The patient voiced understanding and agreement to the plan.  [place], DO 05/01/19  2:09 PM

## 2019-07-02 ENCOUNTER — Ambulatory Visit (INDEPENDENT_AMBULATORY_CARE_PROVIDER_SITE_OTHER): Payer: BC Managed Care – PPO | Admitting: Family Medicine

## 2019-07-02 ENCOUNTER — Encounter: Payer: Self-pay | Admitting: Family Medicine

## 2019-07-02 DIAGNOSIS — G43809 Other migraine, not intractable, without status migrainosus: Secondary | ICD-10-CM | POA: Diagnosis not present

## 2019-07-02 DIAGNOSIS — J069 Acute upper respiratory infection, unspecified: Secondary | ICD-10-CM | POA: Diagnosis not present

## 2019-07-02 MED ORDER — NAPROXEN 500 MG PO TABS: 500.0000 mg | ORAL_TABLET | Freq: Two times a day (BID) | ORAL | 0 refills | Status: DC

## 2019-07-02 MED FILL — NAPROXEN 500 MG TABS: 500 | 15 days supply | Qty: 30 | Fill #0

## 2019-07-02 NOTE — Progress Notes (Signed)
Chief Complaint  Patient presents with  . Otalgia    left  . Migraine    Gerilyn Nestle here for URI complaints. Due to COVID-19 pandemic, we are interacting via web portal for an electronic face-to-face visit. I verified patient's ID using 2 identifiers. Patient agreed to proceed with visit via this method. Patient is at home, I am at office. Patient and I are present for visit.   Duration: 10 days  Associated symptoms: sinus congestion, rhinorrhea, sore throat, L ear pain, L sided migraine, N/V, and cough Denies: sinus pain, itchy watery eyes, ear drainage, wheezing, shortness of breath, myalgia and diarrhea Treatment to date: Tylenol Sick contacts: Yes- ppl at work have tested + for COVID  ROS:  Const: Denies fevers HEENT: As noted in HPI Lungs: No SOB  Past Medical History:  Diagnosis Date  . Anemia   . Anxiety   . 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 Back pain   . Deficiency of potassium   . Depression   . HPV in female   . Hypokalemia   . Migraine   . PID (pelvic inflammatory disease)    Exam No conversational dyspnea Age appropriate judgment and insight Nml affect and mood  Upper respiratory tract infection, unspecified type - Plan: Novel Coronavirus, NAA ([facility])  Other migraine without status migrainosus, not intractable - Plan: naproxen (NAPROSYN) 500 MG tablet  Orders as above. Cont Zofran, Tylenol, needs to pick up triptan from pharm, let me know if there are cost issues.  Continue to push fluids, practice good hand hygiene, cover mouth when coughing. F/u prn. If starting to experience fevers, shaking, or shortness of breath, seek immediate care. Pt voiced understanding and agreement to the plan.  [place], DO 07/02/19 1:17 PM

## 2019-07-18 ENCOUNTER — Encounter: Payer: Self-pay | Admitting: Family Medicine

## 2019-07-18 ENCOUNTER — Ambulatory Visit: Payer: Self-pay | Admitting: Family Medicine

## 2019-07-23 ENCOUNTER — Telehealth: Payer: Self-pay

## 2019-07-23 ENCOUNTER — Other Ambulatory Visit: Payer: Self-pay

## 2019-07-23 ENCOUNTER — Ambulatory Visit (INDEPENDENT_AMBULATORY_CARE_PROVIDER_SITE_OTHER): Payer: BC Managed Care – PPO | Admitting: Family Medicine

## 2019-07-23 ENCOUNTER — Encounter: Payer: Self-pay | Admitting: Family Medicine

## 2019-07-23 VITALS — BP 108/68 | HR 99 | Temp 98.3°F | Ht 61.0 in | Wt 117.0 lb

## 2019-07-23 DIAGNOSIS — M545 Low back pain, unspecified: Secondary | ICD-10-CM

## 2019-07-23 MED ORDER — CYCLOBENZAPRINE HCL 10 MG PO TABS: 5.0000 mg | ORAL_TABLET | Freq: Three times a day (TID) | ORAL | 0 refills | Status: DC | PRN

## 2019-07-23 MED ORDER — MELOXICAM 15 MG PO TABS: 15.0000 mg | ORAL_TABLET | Freq: Every day | ORAL | 0 refills | Status: DC

## 2019-07-23 MED FILL — CYCLOBENZAPRINE HCL 10 MG T: 10 | 7 days supply | Qty: 21 | Fill #0

## 2019-07-23 MED FILL — MELOXICAM 15 MG TABLET: 15 | 30 days supply | Qty: 30 | Fill #0

## 2019-07-23 NOTE — Progress Notes (Signed)
Musculoskeletal Exam  Patient: Tammy Mejia DOB: 1991/02/27  DOS: 07/23/2019  SUBJECTIVE:  Chief Complaint:   Chief Complaint  Patient presents with  . Back Pain    Tammy Mejia is a 28 y.o.  female for evaluation and treatment of her back pain.   Onset:  2 days ago. Was picking up son and doing more getting birthday for son around .  Location: lower Character:  sharp  Progression of issue:  is unchanged Associated symptoms: no redness, bruising, or swelling Denies bowel/bladder incontinence or weakness Treatment: to date has been: heat.   Neurovascular symptoms: no  ROS: Musculoskeletal/Extremities: +back pain Neurologic: no numbness, tingling no weakness   Past Medical History:  Diagnosis Date  . Anemia   . Anxiety   . 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 Back pain   . Deficiency of potassium   . Depression   . HPV in female   . Hypokalemia   . Migraine   . PID (pelvic inflammatory disease)     Objective:  VITAL SIGNS: BP 108/68 (BP Location: Left Arm, Patient Position: Sitting, Cuff Size: Normal)   Pulse 99   Temp 98.3 F (36.8 C) (Temporal)   Ht 5\' 1"  (1.549 m)   Wt 117 lb (53.1 kg)   SpO2 98%   BMI 22.11 kg/m  Constitutional: Well formed, well developed. No acute distress. HENT: Normocephalic, atraumatic.  Thorax & Lungs:  No accessory muscle use Skin: Warm. Dry. No erythema. No rash.  Musculoskeletal: low back.   Tenderness to palpation: yes over lumbar parasp msc b/l, R prox glute Deformity: no Ecchymosis: no Straight leg test: negative for Decent hamstring flexibility b/l. Neurologic: Normal sensory function. No focal deficits noted. DTR's equal and symmetric in LE's. No clonus. Psychiatric: Normal mood. Age appropriate judgment and insight. Alert & oriented x 3.    Assessment:  Acute bilateral low back pain, unspecified whether sciatica present - Plan: cyclobenzaprine (FLEXERIL) 10 MG tablet, meloxicam (MOBIC) 15 MG  tablet  Plan: Orders as above. Stretches/exercises, heat, ice, Tylenol, NSAIDs. 20 lb wt lifting restriction given for 2 weeks.  F/u prn. The patient voiced understanding and agreement to the plan.   [place], DO 07/23/19  2:54 PM

## 2019-07-23 NOTE — Telephone Encounter (Signed)
Called and scheduled appt today at 2:15

## 2019-07-23 NOTE — Telephone Encounter (Signed)
Copied from [place] [id number]. Topic: Appointment Scheduling - Scheduling Inquiry for Clinic >> Jul 23, 2019  7:46 AM Alanda Slim E wrote: Reason for CRM: Pt would like an appt today for back spasms / please advise

## 2019-07-23 NOTE — Patient Instructions (Signed)
Heat (pad or rice pillow in microwave) over affected area, 10-15 minutes twice daily.  ° °OK to take Tylenol 1000 mg (2 extra strength tabs) or 975 mg (3 regular strength tabs) every 6 hours as needed. ° °Take Flexeril (cyclobenzaprine) 1-2 hours before planned bedtime. If it makes you drowsy, do not take during the day. You can try half a tab the following night. ° °EXERCISES  °RANGE OF MOTION (ROM) AND STRETCHING EXERCISES - Low Back Pain °Most people with lower back pain will find that their symptoms get worse with excessive bending forward (flexion) or arching at the lower back (extension). The exercises that will help resolve your symptoms will focus on the opposite motion.  °If you have pain, numbness or tingling which travels down into your buttocks, leg or foot, the goal of the therapy is for these symptoms to move closer to your back and eventually resolve. Sometimes, these leg symptoms will get better, but your lower back pain may worsen. This is often an indication of progress in your rehabilitation. Be very alert to any changes in your symptoms and the activities in which you participated in the 24 hours prior to the change. Sharing this information with your caregiver will allow him or her to most efficiently treat your condition. °These exercises may help you when beginning to rehabilitate your injury. Your symptoms may resolve with or without further involvement from your physician, physical therapist or athletic trainer. While completing these exercises, remember:  °Restoring tissue flexibility helps normal motion to return to the joints. This allows healthier, less painful movement and activity. °An effective stretch should be held for at least 30 seconds. °A stretch should never be painful. You should only feel a gentle lengthening or release in the stretched tissue. °FLEXION RANGE OF MOTION AND STRETCHING EXERCISES: ° °STRETCH - Flexion, Single Knee to Chest  °Lie on a firm bed or floor with both  legs extended in front of you. °Keeping one leg in contact with the floor, bring your opposite knee to your chest. Hold your leg in place by either grabbing behind your thigh or at your knee. °Pull until you feel a gentle stretch in your low back. Hold 30 seconds. °Slowly release your grasp and repeat the exercise with the opposite side. °Repeat 2 times. Complete this exercise 3 times per week.  ° °STRETCH - Flexion, Double Knee to Chest °Lie on a firm bed or floor with both legs extended in front of you. °Keeping one leg in contact with the floor, bring your opposite knee to your chest. °Tense your stomach muscles to support your back and then lift your other knee to your chest. Hold your legs in place by either grabbing behind your thighs or at your knees. °Pull both knees toward your chest until you feel a gentle stretch in your low back. Hold 30 seconds. °Tense your stomach muscles and slowly return one leg at a time to the floor. °Repeat 2 times. Complete this exercise 3 times per week.  ° °STRETCH - Low Trunk Rotation °Lie on a firm bed or floor. Keeping your legs in front of you, bend your knees so they are both pointed toward the ceiling and your feet are flat on the floor. °Extend your arms out to the side. This will stabilize your upper body by keeping your shoulders in contact with the floor. °Gently and slowly drop both knees together to one side until you feel a gentle stretch in your low back. Hold for   30 seconds. °Tense your stomach muscles to support your lower back as you bring your knees back to the starting position. Repeat the exercise to the other side. °Repeat 2 times. Complete this exercise at least 3 times per week. °  °EXTENSION RANGE OF MOTION AND FLEXIBILITY EXERCISES: ° °STRETCH - Extension, Prone on Elbows  °Lie on your stomach on the floor, a bed will be too soft. Place your palms about shoulder width apart and at the height of your head. °Place your elbows under your shoulders. If this  is too painful, stack pillows under your chest. °Allow your body to relax so that your hips drop lower and make contact more completely with the floor. °Hold this position for 30 seconds. °Slowly return to lying flat on the floor. °Repeat 2 times. Complete this exercise 3 times per week.  ° °RANGE OF MOTION - Extension, Prone Press Ups °Lie on your stomach on the floor, a bed will be too soft. Place your palms about shoulder width apart and at the height of your head. °Keeping your back as relaxed as possible, slowly straighten your elbows while keeping your hips on the floor. You may adjust the placement of your hands to maximize your comfort. As you gain motion, your hands will come more underneath your shoulders. °Hold this position 30 seconds. °Slowly return to lying flat on the floor. °Repeat 2 times. Complete this exercise 3 times per week.  ° °RANGE OF MOTION- Quadruped, Neutral Spine  °Assume a hands and knees position on a firm surface. Keep your hands under your shoulders and your knees under your hips. You may place padding under your knees for comfort. °Drop your head and point your tailbone toward the ground below you. This will round out your lower back like an angry cat. Hold this position for 30 seconds. °Slowly lift your head and release your tail bone so that your back sags into a large arch, like an old horse. °Hold this position for 30 seconds. °Repeat this until you feel limber in your low back. °Now, find your "sweet spot." This will be the most comfortable position somewhere between the two previous positions. This is your neutral spine. Once you have found this position, tense your stomach muscles to support your low back. °Hold this position for 30 seconds. °Repeat 2 times. Complete this exercise 3 times per week.  ° °STRENGTHENING EXERCISES - Low Back Sprain °These exercises may help you when beginning to rehabilitate your injury. These exercises should be done near your "sweet spot." This  is the neutral, low-back arch, somewhere between fully rounded and fully arched, that is your least painful position. When performed in this safe range of motion, these exercises can be used for people who have either a flexion or extension based injury. These exercises may resolve your symptoms with or without further involvement from your physician, physical therapist or athletic trainer. While completing these exercises, remember:  °Muscles can gain both the endurance and the strength needed for everyday activities through controlled exercises. °Complete these exercises as instructed by your physician, physical therapist or athletic trainer. Increase the resistance and repetitions only as guided. °You may experience muscle soreness or fatigue, but the pain or discomfort you are trying to eliminate should never worsen during these exercises. If this pain does worsen, stop and make certain you are following the directions exactly. If the pain is still present after adjustments, discontinue the exercise until you can discuss the trouble with your caregiver. ° °  STRENGTHENING - Deep Abdominals, Pelvic Tilt  °Lie on a firm bed or floor. Keeping your legs in front of you, bend your knees so they are both pointed toward the ceiling and your feet are flat on the floor. °Tense your lower abdominal muscles to press your low back into the floor. This motion will rotate your pelvis so that your tail bone is scooping upwards rather than pointing at your feet or into the floor. °With a gentle tension and even breathing, hold this position for 3 seconds. °Repeat 2 times. Complete this exercise 3 times per week.  ° °STRENGTHENING - Abdominals, Crunches  °Lie on a firm bed or floor. Keeping your legs in front of you, bend your knees so they are both pointed toward the ceiling and your feet are flat on the floor. Cross your arms over your chest. °Slightly tip your chin down without bending your neck. °Tense your abdominals and  slowly lift your trunk high enough to just clear your shoulder blades. Lifting higher can put excessive stress on the lower back and does not further strengthen your abdominal muscles. °Control your return to the starting position. °Repeat 2 times. Complete this exercise 3 times per week.  ° °STRENGTHENING - Quadruped, Opposite UE/LE Lift  °Assume a hands and knees position on a firm surface. Keep your hands under your shoulders and your knees under your hips. You may place padding under your knees for comfort. °Find your neutral spine and gently tense your abdominal muscles so that you can maintain this position. Your shoulders and hips should form a rectangle that is parallel with the floor and is not twisted. °Keeping your trunk steady, lift your right hand no higher than your shoulder and then your left leg no higher than your hip. Make sure you are not holding your breath. Hold this position for 30 seconds. °Continuing to keep your abdominal muscles tense and your back steady, slowly return to your starting position. Repeat with the opposite arm and leg. °Repeat 2 times. Complete this exercise 3 times per week.  ° °STRENGTHENING - Abdominals and Quadriceps, Straight Leg Raise  °Lie on a firm bed or floor with both legs extended in front of you. °Keeping one leg in contact with the floor, bend the other knee so that your foot can rest flat on the floor. °Find your neutral spine, and tense your abdominal muscles to maintain your spinal position throughout the exercise. °Slowly lift your straight leg off the floor about 6 inches for a count of 3, making sure to not hold your breath. °Still keeping your neutral spine, slowly lower your leg all the way to the floor. °Repeat this exercise with each leg 2 times. Complete this exercise 3 times per week. ° °POSTURE AND BODY MECHANICS CONSIDERATIONS - Low Back Sprain °Keeping correct posture when sitting, standing or completing your activities will reduce the stress put  on different body tissues, allowing injured tissues a chance to heal and limiting painful experiences. The following are general guidelines for improved posture.  While reading these guidelines, remember: °The exercises prescribed by your provider will help you have the flexibility and strength to maintain correct postures. °The correct posture provides the best environment for your joints to work. All of your joints have less wear and tear when properly supported by a spine with good posture. This means you will experience a healthier, less painful body. °Correct posture must be practiced with all of your activities, especially prolonged sitting and standing. Correct   posture is as important when doing repetitive low-stress activities (typing) as it is when doing a single heavy-load activity (lifting). ° °RESTING POSITIONS °Consider which positions are most painful for you when choosing a resting position. If you have pain with flexion-based activities (sitting, bending, stooping, squatting), choose a position that allows you to rest in a less flexed posture. You would want to avoid curling into a fetal position on your side. If your pain worsens with extension-based activities (prolonged standing, working overhead), avoid resting in an extended position such as sleeping on your stomach. Most people will find more comfort when they rest with their spine in a more neutral position, neither too rounded nor too arched. Lying on a non-sagging bed on your side with a pillow between your knees, or on your back with a pillow under your knees will often provide some relief. Keep in mind, being in any one position for a prolonged period of time, no matter how correct your posture, can still lead to stiffness. ° °PROPER SITTING POSTURE °In order to minimize stress and discomfort on your spine, you must sit with correct posture. Sitting with good posture should be effortless for a healthy body. Returning to good posture is a  gradual process. Many people can work toward this most comfortably by using various supports until they have the flexibility and strength to maintain this posture on their own. °When sitting with proper posture, your ears will fall over your shoulders and your shoulders will fall over your hips. You should use the back of the chair to support your upper back. Your lower back will be in a neutral position, just slightly arched. You may place a small pillow or folded towel at the base of your lower back for  °support.  °When working at a desk, create an environment that supports good, upright posture. Without extra support, muscles tire, which leads to excessive strain on joints and other tissues. Keep these recommendations in mind: ° °CHAIR: °A chair should be able to slide under your desk when your back makes contact with the back of the chair. This allows you to work closely. °The chair's height should allow your eyes to be level with the upper part of your monitor and your hands to be slightly lower than your elbows. ° °BODY POSITION °Your feet should make contact with the floor. If this is not possible, use a foot rest. °Keep your ears over your shoulders. This will reduce stress on your neck and low back. ° °INCORRECT SITTING POSTURES  °If you are feeling tired and unable to assume a healthy sitting posture, do not slouch or slump. This puts excessive strain on your back tissues, causing more damage and pain. Healthier options include: °Using more support, like a lumbar pillow. °Switching tasks to something that requires you to be upright or walking. °Talking a brief walk. °Lying down to rest in a neutral-spine position. ° °PROLONGED STANDING WHILE SLIGHTLY LEANING FORWARD  °When completing a task that requires you to lean forward while standing in one place for a long time, place either foot up on a stationary 2-4 inch high object to help maintain the best posture. When both feet are on the ground, the lower  back tends to lose its slight inward curve. If this curve flattens (or becomes too large), then the back and your other joints will experience too much stress, tire more quickly, and can cause pain. ° °CORRECT STANDING POSTURES °Proper standing posture should be assumed with all   daily activities, even if they only take a few moments, like when brushing your teeth. As in sitting, your ears should fall over your shoulders and your shoulders should fall over your hips. You should keep a slight tension in your abdominal muscles to brace your spine. Your tailbone should point down to the ground, not behind your body, resulting in an over-extended swayback posture.  ° °INCORRECT STANDING POSTURES  °Common incorrect standing postures include a forward head, locked knees and/or an excessive swayback. °WALKING °Walk with an upright posture. Your ears, shoulders and hips should all line-up. ° °PROLONGED ACTIVITY IN A FLEXED POSITION °When completing a task that requires you to bend forward at your waist or lean over a low surface, try to find a way to stabilize 3 out of 4 of your limbs. You can place a hand or elbow on your thigh or rest a knee on the surface you are reaching across. This will provide you more stability, so that your muscles do not tire as quickly. By keeping your knees relaxed, or slightly bent, you will also reduce stress across your lower back. °CORRECT LIFTING TECHNIQUES ° °DO : °Assume a wide stance. This will provide you more stability and the opportunity to get as close as possible to the object which you are lifting. °Tense your abdominals to brace your spine. Bend at the knees and hips. Keeping your back locked in a neutral-spine position, lift using your leg muscles. Lift with your legs, keeping your back straight. °Test the weight of unknown objects before attempting to lift them. °Try to keep your elbows locked down at your sides in order get the best strength from your shoulders when carrying an  object. ° ° °Always ask for help when lifting heavy or awkward objects. °INCORRECT LIFTING TECHNIQUES °DO NOT:  °Lock your knees when lifting, even if it is a small object. °Bend and twist. Pivot at your feet or move your feet when needing to change directions. °Assume that you can safely pick up even a paperclip without proper posture. ° °

## 2019-07-27 ENCOUNTER — Encounter: Payer: Self-pay | Admitting: Family Medicine

## 2019-08-28 ENCOUNTER — Other Ambulatory Visit: Payer: Self-pay

## 2019-08-28 ENCOUNTER — Ambulatory Visit (INDEPENDENT_AMBULATORY_CARE_PROVIDER_SITE_OTHER): Payer: BC Managed Care – PPO | Admitting: Family Medicine

## 2019-08-28 ENCOUNTER — Encounter: Payer: Self-pay | Admitting: Family Medicine

## 2019-08-28 DIAGNOSIS — J069 Acute upper respiratory infection, unspecified: Secondary | ICD-10-CM

## 2019-08-28 NOTE — Progress Notes (Signed)
Chief Complaint  Patient presents with  . Nasal Congestion    tested for COVID yesterday//was negative  . Sore Throat  . Cough    Tammy Mejia here for URI complaints.  Duration: 4 days  Associated symptoms: sinus congestion, rhinorrhea, sore throat and cough Denies: sinus pain, itchy watery eyes, ear pain, ear drainage, wheezing, shortness of breath, myalgia and fevers, GI s/s's Treatment to date: Tylenol, OTC meds Sick contacts: Yes; infant son got sick first, then passed on to husband. They are both doing better. She was tested for COVID yesterday and it was neg.   ROS:  Const: Denies fevers HEENT: As noted in HPI Lungs: No SOB  Past Medical History:  Diagnosis Date  . Anemia   . Anxiety   . 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 Back pain   . Deficiency of potassium   . Depression   . HPV in female   . Hypokalemia   . Migraine   . PID (pelvic inflammatory disease)    Exam No conversational dyspnea Age appropriate judgment and insight Nml affect and mood  Viral URI with cough  Offered cough med, she declined. Tylenol for discomfort. OTC cough meds. Continue to push fluids, practice good hand hygiene, cover mouth when coughing. Letter for work given. Will likely be able to go back in next 1-2 days.  F/u prn. If starting to experience fevers, shaking, or shortness of breath, seek immediate care. Pt voiced understanding and agreement to the plan.  [place], DO 08/28/19 12:54 PM

## 2019-10-03 ENCOUNTER — Ambulatory Visit: Payer: Managed Care, Other (non HMO) | Attending: Internal Medicine

## 2019-10-03 DIAGNOSIS — Z20822 Contact with and (suspected) exposure to covid-19: Secondary | ICD-10-CM

## 2019-10-04 LAB — NOVEL CORONAVIRUS, NAA: SARS-CoV-2, NAA: NOT DETECTED

## 2019-10-05 ENCOUNTER — Encounter: Payer: Self-pay | Admitting: Family Medicine

## 2019-10-09 ENCOUNTER — Encounter: Payer: Self-pay | Admitting: Family Medicine

## 2019-10-23 ENCOUNTER — Ambulatory Visit: Payer: Managed Care, Other (non HMO) | Admitting: Family Medicine

## 2019-10-23 DIAGNOSIS — Z0289 Encounter for other administrative examinations: Secondary | ICD-10-CM

## 2019-12-06 ENCOUNTER — Ambulatory Visit (INDEPENDENT_AMBULATORY_CARE_PROVIDER_SITE_OTHER): Payer: Managed Care, Other (non HMO) | Admitting: Family Medicine

## 2019-12-06 ENCOUNTER — Other Ambulatory Visit: Payer: Self-pay

## 2019-12-06 ENCOUNTER — Encounter: Payer: Self-pay | Admitting: Family Medicine

## 2019-12-06 ENCOUNTER — Ambulatory Visit: Payer: Managed Care, Other (non HMO) | Attending: Internal Medicine

## 2019-12-06 DIAGNOSIS — J029 Acute pharyngitis, unspecified: Secondary | ICD-10-CM | POA: Diagnosis not present

## 2019-12-06 MED ORDER — CEFDINIR 300 MG PO CAPS: 300.0000 mg | ORAL_CAPSULE | Freq: Two times a day (BID) | ORAL | 0 refills | Status: DC

## 2019-12-06 NOTE — Progress Notes (Signed)
[place] at [facility] [address] [place], [place] [address] [phone]  Date:  12/06/2019   Name:  Tammy Mejia   DOB:  1991-07-27   MRN:  [id number]  PCP:  Shelda Pal, DO    Chief Complaint: No chief complaint on file.   History of Present Illness:  Tammy Mejia is a 29 y.o. very pleasant female patient who presents with the following:  Primary patient of my partner Dr. Nani Ravens, virtual visit today for illness I have seen her in the past, most recently 2018 Patient location is home, provider location is office.  Patient identity confirmed with 2 factors, she gives consent for virtual visit today.  The patient and myself are present on the call today  Pt awoke this am feeling ill- she was supposed to get her covid vaccine today but she was too ill-she slept through her appointment She notes a sore throat, HA and left-sided earache The earache is pretty severe No drainage from the ear Hearing is normal Throat is pretty sore Mild cough- off and on She is not aware of any fever She has not had covid infection as of yet No vomiting but some nausea, no diarrhea    She is not nursing at the moment, is not pregnant as far as she knows LMP was last week; however she notes that this followed a extended period with no menstruation, she was using Depo-Provera for a while during this time She has a 18 year old son She works at [employer] and is tested for covid weekly   Patient Active Problem List   Diagnosis Date Noted  . Depression affecting pregnancy, antepartum 01/30/2018  . 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 Anxiety and depression 03/16/2016  . Migraine 08/23/2014  . Palpitations 06/29/2013    Past Medical History:  Diagnosis Date  . Anemia   . Anxiety   . 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 Back pain   . Deficiency of potassium   . Depression   . HPV in  female   . Hypokalemia   . Migraine   . PID (pelvic inflammatory disease)     Past Surgical History:  Procedure Laterality Date  . DILATION AND CURETTAGE OF UTERUS    . DILATION AND EVACUATION N/A 05/13/2017   Procedure: DILATATION AND EVACUATION;  Surgeon: Donnamae Jude, MD;  Location: [facility];  Service: Gynecology;  Laterality: N/A;    Social History   Tobacco Use  . Smoking status: Never Smoker  . Smokeless tobacco: Never Used  Substance Use Topics  . Alcohol use: No  . Drug use: No    Family History  Problem Relation Age of Onset  . COPD Mother   . Diabetes Father   . Heart disease Father   . Cancer Neg Hx   . Hypertension Neg Hx   . Stroke Neg Hx     No Known Allergies  Medication list has been reviewed and updated.  Current Outpatient Medications on File Prior to Visit  Medication Sig Dispense Refill  . chlorhexidine (HIBICLENS) 4 % external liquid Apply topically every other day. 120 mL 0  . cyclobenzaprine (FLEXERIL) 10 MG tablet Take 0.5-1 tablets (5-10 mg total) by mouth 3 (three) times daily as needed for muscle spasms. 21 tablet 0  . diclofenac (VOLTAREN) 75 MG EC tablet Take 1 tablet (75 mg total) by mouth  2 (two) times daily with a meal. 60 tablet 2  . dicyclomine (BENTYL) 10 MG capsule Take 1 capsule (10 mg total) by mouth 4 (four) times daily -  before meals and at bedtime. 30 capsule 0  . Fe Fum-FePoly-Vit C-Vit B3 (INTEGRA) 62.5-62.5-40-3 MG CAPS Take 1 capsule by mouth 2 (two) times daily. 60 capsule 2  . loratadine (CLARITIN) 10 MG tablet Take 1 tablet (10 mg total) by mouth daily. 30 tablet 0  . meloxicam (MOBIC) 15 MG tablet Take 1 tablet (15 mg total) by mouth daily. 30 tablet 0  . naproxen (NAPROSYN) 500 MG tablet Take 1 tablet (500 mg total) by mouth 2 (two) times daily with a meal. 30 tablet 0  . naratriptan (AMERGE) 2.5 MG tablet Take one (1) tablet at onset of headache; if returns or does not resolve, may repeat after 4 hours; do not exceed  five (5) mg in 24 hrs. 10 tablet 0  . norgestimate-ethinyl estradiol (ORTHO-CYCLEN) 0.25-35 MG-MCG tablet Take 1 tablet by mouth daily. 1 Package 11  . ondansetron (ZOFRAN) 4 MG tablet Take 1 tablet (4 mg total) by mouth every 8 (eight) hours as needed. 20 tablet 0  . Prenatal Vit-Fe Fumarate-FA (MULTIVITAMIN-PRENATAL) 27-0.8 MG TABS tablet Take 1 tablet by mouth daily at 12 noon.    . saccharomyces boulardii (FLORASTOR) 250 MG capsule Take 1 capsule (250 mg total) by mouth 2 (two) times daily.    . sertraline (ZOLOFT) 50 MG tablet Take 1 tablet (50 mg total) by mouth daily. 30 tablet 5   No current facility-administered medications on file prior to visit.    Review of Systems:  As per HPI- otherwise negative.   Physical Examination: There were no vitals filed for this visit. There were no vitals filed for this visit. There is no height or weight on file to calculate BMI. Ideal Body Weight:    Patient observed over video monitor.  She does not appear toxic but looks tired.  Her throat is erythematous but no exudate is visible over video No significant cough, no wheezing, shortness of breath, distress is noted  Assessment and Plan: Pharyngitis, unspecified etiology - Plan: cefdinir (OMNICEF) 300 MG capsule  Virtual visit today for concern of pharyngitis, general malaise.  No fever noted Advised patient that she may have bacterial infection such as strep throat, or she could have COVID-19.  We will start her on Omnicef for her throat and ear pain/possible infection-avoid amoxicillin due to risk of mono I encouraged her to be tested for COVID-19 ASAP, she believes this can be done through her workplace and plans to be tested tomorrow.  She will contact us if feeling worse, or if not feeling better in the next several days If she does have COVID-19 she will let us know  Signed Abbe Amsterdam, MD

## 2019-12-31 ENCOUNTER — Ambulatory Visit: Payer: Managed Care, Other (non HMO) | Attending: Internal Medicine

## 2019-12-31 ENCOUNTER — Ambulatory Visit: Payer: Managed Care, Other (non HMO) | Admitting: Family Medicine

## 2019-12-31 DIAGNOSIS — Z0289 Encounter for other administrative examinations: Secondary | ICD-10-CM

## 2020-02-01 ENCOUNTER — Telehealth: Payer: Self-pay | Admitting: Family Medicine

## 2020-02-01 ENCOUNTER — Ambulatory Visit (INDEPENDENT_AMBULATORY_CARE_PROVIDER_SITE_OTHER): Payer: Managed Care, Other (non HMO) | Admitting: Medical

## 2020-02-01 ENCOUNTER — Other Ambulatory Visit: Payer: Self-pay

## 2020-02-01 VITALS — BP 100/61 | HR 77 | Temp 97.7°F | Resp 18 | Ht 61.0 in | Wt 115.0 lb

## 2020-02-01 DIAGNOSIS — G43809 Other migraine, not intractable, without status migrainosus: Secondary | ICD-10-CM

## 2020-02-01 DIAGNOSIS — G43719 Chronic migraine without aura, intractable, without status migrainosus: Secondary | ICD-10-CM | POA: Diagnosis not present

## 2020-02-01 MED ORDER — KETOROLAC TROMETHAMINE 60 MG/2ML IM SOLN
60.0000 mg | Freq: Once | INTRAMUSCULAR | Status: AC
  Administered 2020-02-01: 60 mg via INTRAMUSCULAR

## 2020-02-01 MED ORDER — NARATRIPTAN HCL 2.5 MG PO TABS: ORAL_TABLET | ORAL | 0 refills | Status: DC

## 2020-02-01 NOTE — Progress Notes (Signed)
Subjective:    Patient ID: Tammy Mejia, female    DOB: 20-Mar-1991, 29 y.o.   MRN: [id number]  HPI  Pt in states late last night got ha around  9-10pm. Got worse over the night. Light and sound sensitive with nausea. Pt recent work schedule change may be playing a role.   She has hx of migraines.  Pt last migraine was about 2 weeks ago before this ha. Pt states she does have rx of amerge but she never filled. States might have been to expensive.  She has not taken anything for ha since last night other than tylenol.  LMP- started on Jan 29, 2020.  No gross motor or sensory function deficits.   In past toradol would get rid of ha within 30 minutes.   Review of Systems  Constitutional: Negative for chills, fatigue and fever.  Respiratory: Negative for chest tightness, shortness of breath and wheezing.   Cardiovascular: Negative for chest pain and palpitations.  Gastrointestinal: Negative for abdominal pain, constipation, nausea and vomiting.  Musculoskeletal: Negative for back pain.  Skin: Negative for rash.  Neurological: Positive for headaches. Negative for dizziness, seizures, syncope, weakness and numbness.  Hematological: Negative for adenopathy. Does not bruise/bleed easily.  Psychiatric/Behavioral: Negative for confusion and sleep disturbance. The patient is not nervous/anxious.     Past Medical History:  Diagnosis Date  . Anemia   . Anxiety   . 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 Back pain   . Deficiency of potassium   . Depression   . HPV in female   . Hypokalemia   . Migraine   . PID (pelvic inflammatory disease)      Social History   Socioeconomic History  . Marital status: Married    Spouse name: Not on file  . Number of children: Not on file  . Years of education: Not on file  . Highest education level: Not on file  Occupational History  . Not on file  Tobacco Use  . Smoking status: Never Smoker  . Smokeless tobacco: Never  Used  Substance and Sexual Activity  . Alcohol use: No  . Drug use: No  . Sexual activity: Yes    Birth control/protection: None  Other Topics Concern  . Not on file  Social History Narrative  . Not on file   Social Determinants of Health   Financial Resource Strain:   . Difficulty of Paying Living Expenses:   Food Insecurity:   . Worried About Programme researcher, broadcasting/film/video in the Last Year:   . Barista in the Last Year:   Transportation Needs:   . Freight forwarder (Medical):   Marland Kitchen Lack of Transportation (Non-Medical):   Physical Activity:   . Days of Exercise per Week:   . Minutes of Exercise per Session:   Stress:   . Feeling of Stress :   Social Connections:   . Frequency of Communication with Friends and Family:   . Frequency of Social Gatherings with Friends and Family:   . Attends Religious Services:   . Active Member of Clubs or Organizations:   . Attends Banker Meetings:   Marland Kitchen Marital Status:   Intimate Partner Violence:   . Fear of Current or Ex-Partner:   . Emotionally Abused:   Marland Kitchen Physically Abused:   . Sexually Abused:     Past Surgical History:  Procedure Laterality Date  . DILATION AND CURETTAGE OF UTERUS    .  DILATION AND EVACUATION N/A 05/13/2017   Procedure: DILATATION AND EVACUATION;  Surgeon: Donnamae Jude, MD;  Location: [facility];  Service: Gynecology;  Laterality: N/A;    Family History  Problem Relation Age of Onset  . COPD Mother   . Diabetes Father   . Heart disease Father   . Cancer Neg Hx   . Hypertension Neg Hx   . Stroke Neg Hx     No Known Allergies  Current Outpatient Medications on File Prior to Visit  Medication Sig Dispense Refill  . cefdinir (OMNICEF) 300 MG capsule Take 1 capsule (300 mg total) by mouth 2 (two) times daily. 20 capsule 0  . chlorhexidine (HIBICLENS) 4 % external liquid Apply topically every other day. 120 mL 0  . cyclobenzaprine (FLEXERIL) 10 MG tablet Take 0.5-1 tablets (5-10 mg total) by  mouth 3 (three) times daily as needed for muscle spasms. 21 tablet 0  . diclofenac (VOLTAREN) 75 MG EC tablet Take 1 tablet (75 mg total) by mouth 2 (two) times daily with a meal. 60 tablet 2  . dicyclomine (BENTYL) 10 MG capsule Take 1 capsule (10 mg total) by mouth 4 (four) times daily -  before meals and at bedtime. 30 capsule 0  . Fe Fum-FePoly-Vit C-Vit B3 (INTEGRA) 62.5-62.5-40-3 MG CAPS Take 1 capsule by mouth 2 (two) times daily. 60 capsule 2  . loratadine (CLARITIN) 10 MG tablet Take 1 tablet (10 mg total) by mouth daily. 30 tablet 0  . meloxicam (MOBIC) 15 MG tablet Take 1 tablet (15 mg total) by mouth daily. 30 tablet 0  . naproxen (NAPROSYN) 500 MG tablet Take 1 tablet (500 mg total) by mouth 2 (two) times daily with a meal. 30 tablet 0  . naratriptan (AMERGE) 2.5 MG tablet Take one (1) tablet at onset of headache; if returns or does not resolve, may repeat after 4 hours; do not exceed five (5) mg in 24 hrs. 10 tablet 0  . norgestimate-ethinyl estradiol (ORTHO-CYCLEN) 0.25-35 MG-MCG tablet Take 1 tablet by mouth daily. 1 Package 11  . ondansetron (ZOFRAN) 4 MG tablet Take 1 tablet (4 mg total) by mouth every 8 (eight) hours as needed. 20 tablet 0  . Prenatal Vit-Fe Fumarate-FA (MULTIVITAMIN-PRENATAL) 27-0.8 MG TABS tablet Take 1 tablet by mouth daily at 12 noon.    . saccharomyces boulardii (FLORASTOR) 250 MG capsule Take 1 capsule (250 mg total) by mouth 2 (two) times daily.    . sertraline (ZOLOFT) 50 MG tablet Take 1 tablet (50 mg total) by mouth daily. 30 tablet 5   No current facility-administered medications on file prior to visit.    BP 100/61 (BP Location: Left Arm, Patient Position: Sitting, Cuff Size: Normal)   Pulse 77   Temp 97.7 F (36.5 C) (Temporal)   Resp 18   Ht 5\' 1"  (1.549 m)   Wt 115 lb (52.2 kg)   LMP 01/29/2020   SpO2 98%   BMI 21.73 kg/m       Objective:   Physical Exam  General Mental Status- Alert. General Appearance- Not in acute distress.    Skin General: Color- Normal Color. Moisture- Normal Moisture.  Neck Carotid Arteries- Normal color. Moisture- Normal Moisture. No carotid bruits. No JVD.  Chest and Lung Exam Auscultation: Breath Sounds:-Normal.  Cardiovascular Auscultation:Rythm- Regular. Murmurs & Other Heart Sounds:Auscultation of the heart reveals- No Murmurs.  Abdomen Inspection:-Inspeection Normal. Palpation/Percussion:Note:No mass. Palpation and Percussion of the abdomen reveal- Non Tender, Non Distended + BS, no rebound or  guarding.    Neurologic Cranial Nerve exam:- CN III-XII intact(No nystagmus), symmetric smile. Drift Test:- No drift. .Finger to Nose:- Normal/Intact Strength:- 5/5 equal and symmetric strength both upper and lower extremities. Light sensitive.       Assessment & Plan:  You do describe migraine type headache.  Will give Toradol 60 mg IM and see how you respond.  If headache persists let us know.  If no response or worsening signs/symptoms then recommend ED evaluation.  I do recommend you go ahead and try to fill the prescription out of amerge as this could stop your headaches early on.  Follow-up in 2 weeks or as needed.  If you do get emergency field and it does not help then might consider prescription of Topamax for going ahead and referring you to neurologist.  Esperanza Richters, PA-C   Time spent with patient today was 20  minutes which consisted of  discussing diagnosis, treatment and documentation.

## 2020-02-01 NOTE — Telephone Encounter (Signed)
Patient is requesting a note to return to work tomorrow

## 2020-02-01 NOTE — Telephone Encounter (Signed)
Note added to [employer]

## 2020-02-01 NOTE — Patient Instructions (Signed)
You do describe migraine type headache.  Will give Toradol 60 mg IM and see how you respond.  If headache persists let us know.  If no response or worsening signs/symptoms then recommend ED evaluation.  I do recommend you go ahead and try to fill the prescription out of amerge as this could stop your headaches early on.  Follow-up in 2 weeks or as needed.  If you do get emergency field and it does not help then might consider prescription of Topamax for going ahead and referring you to neurologist.

## 2020-03-06 MED FILL — hydrOXYzine HCL 25 MG TABS: 25 | 30 days supply | Qty: 90 | Fill #0

## 2020-03-06 MED FILL — ESCITALOPRAM 10 MG TABLET: 10 | 30 days supply | Qty: 30 | Fill #0

## 2020-06-24 ENCOUNTER — Other Ambulatory Visit: Payer: Self-pay

## 2020-06-24 ENCOUNTER — Ambulatory Visit (HOSPITAL_BASED_OUTPATIENT_CLINIC_OR_DEPARTMENT_OTHER)
Admission: RE | Admit: 2020-06-24 | Discharge: 2020-06-24 | Disposition: A | Discharge status: Home / Self Care | Payer: Managed Care, Other (non HMO) | Source: Ambulatory Visit | Attending: Medical | Admitting: Medical

## 2020-06-24 ENCOUNTER — Other Ambulatory Visit: Payer: Self-pay | Admitting: Medical

## 2020-06-24 ENCOUNTER — Ambulatory Visit (INDEPENDENT_AMBULATORY_CARE_PROVIDER_SITE_OTHER): Payer: Managed Care, Other (non HMO) | Admitting: Medical

## 2020-06-24 ENCOUNTER — Encounter: Payer: Self-pay | Admitting: Medical

## 2020-06-24 VITALS — BP 102/63 | HR 74 | Resp 16 | Ht 61.0 in | Wt 116.2 lb

## 2020-06-24 DIAGNOSIS — M545 Low back pain, unspecified: Secondary | ICD-10-CM

## 2020-06-24 MED ORDER — CYCLOBENZAPRINE HCL 10 MG PO TABS: ORAL_TABLET | ORAL | 0 refills | Status: DC

## 2020-06-24 MED ORDER — DICLOFENAC SODIUM 75 MG PO TBEC: 75.0000 mg | DELAYED_RELEASE_TABLET | Freq: Two times a day (BID) | ORAL | 0 refills | Status: DC

## 2020-06-24 MED FILL — DICLOFENAC SODIUM 75 MG TAB: 75 | 10 days supply | Qty: 20 | Fill #0

## 2020-06-24 MED FILL — CYCLOBENZAPRINE HCL 10 MG T: 10 | 30 days supply | Qty: 30 | Fill #0

## 2020-06-24 NOTE — Progress Notes (Signed)
Subjective:    Patient ID: Tammy Mejia, female    DOB: 06-15-1991, 29 y.o.   MRN: [id number]  HPI   Pt in with low back pain recently. She states also told early in years she had some degenerative changes to in back and scoliosis. Incidental finding on US abdomen.  Pt states for 3-4 months some lower back tightening sensation and occasional spasms. But since Sunday pain all of sudden severe. Since Sunday some radicular pain to rt lower ext and some mild weakness. No incontinence reported.  LMP- sept 12-16 th.  Controlled med review site shows no meds.  Pt has tried tylenol and ibuprofen. Pain level recenlty 6-7/10.     Review of Systems  Constitutional: Negative for chills, fatigue and fever.  Respiratory: Negative for cough, chest tightness, shortness of breath and wheezing.   Cardiovascular: Negative for chest pain and palpitations.  Gastrointestinal: Negative for abdominal pain.  Musculoskeletal: Positive for back pain. Negative for neck pain and neck stiffness.  Skin: Negative for rash.  Neurological: Negative for dizziness, seizures and light-headedness.  Hematological: Negative for adenopathy. Does not bruise/bleed easily.    Past Medical History:  Diagnosis Date   Anemia    Anxiety    ASCUS with positive high risk HPV cervical 12/12/2017   Back pain    Deficiency of potassium    Depression    HPV in female    Hypokalemia    Migraine    PID (pelvic inflammatory disease)      Social History   Socioeconomic History   Marital status: Married    Spouse name: Not on file   Number of children: Not on file   Years of education: Not on file   Highest education level: Not on file  Occupational History   Not on file  Tobacco Use   Smoking status: Never Smoker   Smokeless tobacco: Never Used  Vaping Use   Vaping Use: Never used  Substance and Sexual Activity   Alcohol use: No   Drug use: No   Sexual activity: Yes    Birth  control/protection: None  Other Topics Concern   Not on file  Social History Narrative   Not on file   Social Determinants of Health   Financial Resource Strain:    Difficulty of Paying Living Expenses: Not on file  Food Insecurity:    Worried About Running Out of Food in the Last Year: Not on file   [employer] of Food in the Last Year: Not on file  Transportation Needs:    Lack of Transportation (Medical): Not on file   Lack of Transportation (Non-Medical): Not on file  Physical Activity:    Days of Exercise per Week: Not on file   Minutes of Exercise per Session: Not on file  Stress:    Feeling of Stress : Not on file  Social Connections:    Frequency of Communication with Friends and Family: Not on file   Frequency of Social Gatherings with Friends and Family: Not on file   Attends Religious Services: Not on file   Active Member of Clubs or Organizations: Not on file   Attends Banker Meetings: Not on file   Marital Status: Not on file  Intimate Partner Violence:    Fear of Current or Ex-Partner: Not on file   Emotionally Abused: Not on file   Physically Abused: Not on file   Sexually Abused: Not on file    Past Surgical History:  Procedure Laterality Date   DILATION AND CURETTAGE OF UTERUS     DILATION AND EVACUATION N/A 05/13/2017   Procedure: DILATATION AND EVACUATION;  Surgeon: Reva Bores, MD;  Location: [facility];  Service: Gynecology;  Laterality: N/A;    Family History  Problem Relation Age of Onset   COPD Mother    Diabetes Father    Heart disease Father    Cancer Neg Hx    Hypertension Neg Hx    Stroke Neg Hx     No Known Allergies  Current Outpatient Medications on File Prior to Visit  Medication Sig Dispense Refill   cefdinir (OMNICEF) 300 MG capsule Take 1 capsule (300 mg total) by mouth 2 (two) times daily. 20 capsule 0   chlorhexidine (HIBICLENS) 4 % external liquid Apply topically every other day. 120  mL 0   cyclobenzaprine (FLEXERIL) 10 MG tablet Take 0.5-1 tablets (5-10 mg total) by mouth 3 (three) times daily as needed for muscle spasms. 21 tablet 0   diclofenac (VOLTAREN) 75 MG EC tablet Take 1 tablet (75 mg total) by mouth 2 (two) times daily with a meal. 60 tablet 2   dicyclomine (BENTYL) 10 MG capsule Take 1 capsule (10 mg total) by mouth 4 (four) times daily -  before meals and at bedtime. 30 capsule 0   Fe Fum-FePoly-Vit C-Vit B3 (INTEGRA) 62.5-62.5-40-3 MG CAPS Take 1 capsule by mouth 2 (two) times daily. 60 capsule 2   loratadine (CLARITIN) 10 MG tablet Take 1 tablet (10 mg total) by mouth daily. 30 tablet 0   naratriptan (AMERGE) 2.5 MG tablet Take one (1) tablet at onset of headache; if returns or does not resolve, may repeat after 4 hours; do not exceed five (5) mg in 24 hrs. 10 tablet 0   norgestimate-ethinyl estradiol (ORTHO-CYCLEN) 0.25-35 MG-MCG tablet Take 1 tablet by mouth daily. 1 Package 11   ondansetron (ZOFRAN) 4 MG tablet Take 1 tablet (4 mg total) by mouth every 8 (eight) hours as needed. 20 tablet 0   Prenatal Vit-Fe Fumarate-FA (MULTIVITAMIN-PRENATAL) 27-0.8 MG TABS tablet Take 1 tablet by mouth daily at 12 noon.     saccharomyces boulardii (FLORASTOR) 250 MG capsule Take 1 capsule (250 mg total) by mouth 2 (two) times daily.     sertraline (ZOLOFT) 50 MG tablet Take 1 tablet (50 mg total) by mouth daily. 30 tablet 5   No current facility-administered medications on file prior to visit.    BP 102/63    Pulse 74    Resp 16    Ht 5\' 1"  (1.549 m)    Wt 116 lb 3.2 oz (52.7 kg)    LMP 06/01/2020    SpO2 100%    BMI 21.96 kg/m       Objective:   Physical Exam   General Appearance- Not in acute distress.    Chest and Lung Exam Auscultation: Breath sounds:-Normal. Clear even and unlabored. Adventitious sounds:- No Adventitious sounds.  Cardiovascular Auscultation:Rythm - Regular, rate and rythm. Heart Sounds -Normal heart  sounds.  Abdomen Inspection:-Inspection Normal.  Palpation/Perucssion: Palpation and Percussion of the abdomen reveal- Non Tender, No Rebound tenderness, No rigidity(Guarding) and No Palpable abdominal masses.  Liver:-Normal.  Spleen:- Normal.   Back Mid lumbar spine tenderness to palpation. But pain rt si area as well. No pain on straight leg lift. Pain on lateral movements and flexion/extension of the spine.  Lower ext neurologic  L5-S1 sensation intact bilaterally. Normal patellar reflexes bilaterally. No foot drop bilaterally.  Assessment & Plan:  For back pain with radiating features rx diclofenac and flexeril. Rx advisement given. Stop nsaids otc.  Back stretching exercises as tolerated. If pain persist consider sport med referral and PT.  Give [place] update tomorrow or Thursday how you are doing with the above.  Update you on xray result when results are in.  Follow up 7-10 days.

## 2020-06-24 NOTE — Patient Instructions (Addendum)
For back pain with radiating features rx diclofenac and flexeril. Rx advisement given. Stop nsaids otc.  Back stretching exercises as tolerated. If pain persist consider sport med referral and PT.  Give [place] update tomorrow or Thursday how you are doing with the above.  Update you on xray result when results are in.  Follow up 7-10 days.   Back Exercises These exercises help to make your trunk and back strong. They also help to keep the lower back flexible. Doing these exercises can help to prevent back pain or lessen existing pain.  If you have back pain, try to do these exercises 2-3 times each day or as told by your doctor.  As you get better, do the exercises once each day. Repeat the exercises more often as told by your doctor.  To stop back pain from coming back, do the exercises once each day, or as told by your doctor. Exercises Single knee to chest Do these steps 3-5 times in a row for each leg: 1. Lie on your back on a firm bed or the floor with your legs stretched out. 2. Bring one knee to your chest. 3. Grab your knee or thigh with both hands and hold them it in place. 4. Pull on your knee until you feel a gentle stretch in your lower back or buttocks. 5. Keep doing the stretch for 10-30 seconds. 6. Slowly let go of your leg and straighten it. Pelvic tilt Do these steps 5-10 times in a row: 1. Lie on your back on a firm bed or the floor with your legs stretched out. 2. Bend your knees so they point up to the ceiling. Your feet should be flat on the floor. 3. Tighten your lower belly (abdomen) muscles to press your lower back against the floor. This will make your tailbone point up to the ceiling instead of pointing down to your feet or the floor. 4. Stay in this position for 5-10 seconds while you gently tighten your muscles and breathe evenly. Cat-cow Do these steps until your lower back bends more easily: 1. Get on your hands and knees on a firm surface. Keep your  hands under your shoulders, and keep your knees under your hips. You may put padding under your knees. 2. Let your head hang down toward your chest. Tighten (contract) the muscles in your belly. Point your tailbone toward the floor so your lower back becomes rounded like the back of a cat. 3. Stay in this position for 5 seconds. 4. Slowly lift your head. Let the muscles of your belly relax. Point your tailbone up toward the ceiling so your back forms a sagging arch like the back of a cow. 5. Stay in this position for 5 seconds.  Press-ups Do these steps 5-10 times in a row: 1. Lie on your belly (face-down) on the floor. 2. Place your hands near your head, about shoulder-width apart. 3. While you keep your back relaxed and keep your hips on the floor, slowly straighten your arms to raise the top half of your body and lift your shoulders. Do not use your back muscles. You may change where you place your hands in order to make yourself more comfortable. 4. Stay in this position for 5 seconds. 5. Slowly return to lying flat on the floor.  Bridges Do these steps 10 times in a row: 1. Lie on your back on a firm surface. 2. Bend your knees so they point up to the ceiling. Your feet  should be flat on the floor. Your arms should be flat at your sides, next to your body. 3. Tighten your butt muscles and lift your butt off the floor until your waist is almost as high as your knees. If you do not feel the muscles working in your butt and the back of your thighs, slide your feet 1-2 inches farther away from your butt. 4. Stay in this position for 3-5 seconds. 5. Slowly lower your butt to the floor, and let your butt muscles relax. If this exercise is too easy, try doing it with your arms crossed over your chest. Belly crunches Do these steps 5-10 times in a row: 1. Lie on your back on a firm bed or the floor with your legs stretched out. 2. Bend your knees so they point up to the ceiling. Your feet  should be flat on the floor. 3. Cross your arms over your chest. 4. Tip your chin a little bit toward your chest but do not bend your neck. 5. Tighten your belly muscles and slowly raise your chest just enough to lift your shoulder blades a tiny bit off of the floor. Avoid raising your body higher than that, because it can put too much stress on your low back. 6. Slowly lower your chest and your head to the floor. Back lifts Do these steps 5-10 times in a row: 1. Lie on your belly (face-down) with your arms at your sides, and rest your forehead on the floor. 2. Tighten the muscles in your legs and your butt. 3. Slowly lift your chest off of the floor while you keep your hips on the floor. Keep the back of your head in line with the curve in your back. Look at the floor while you do this. 4. Stay in this position for 3-5 seconds. 5. Slowly lower your chest and your face to the floor. Contact a doctor if:  Your back pain gets a lot worse when you do an exercise.  Your back pain does not get better 2 hours after you exercise. If you have any of these problems, stop doing the exercises. Do not do them again unless your doctor says it is okay. Get help right away if:  You have sudden, very bad back pain. If this happens, stop doing the exercises. Do not do them again unless your doctor says it is okay. This information is not intended to replace advice given to you by your health care provider. Make sure you discuss any questions you have with your health care provider. Document Revised: 06/01/2018 Document Reviewed: 06/01/2018 Elsevier Patient Education  2020 [employer].

## 2020-08-28 ENCOUNTER — Encounter (HOSPITAL_BASED_OUTPATIENT_CLINIC_OR_DEPARTMENT_OTHER): Payer: Self-pay | Admitting: Emergency Medicine

## 2020-08-28 ENCOUNTER — Emergency Department (HOSPITAL_BASED_OUTPATIENT_CLINIC_OR_DEPARTMENT_OTHER)
Admission: EM | Admit: 2020-08-28 | Discharge: 2020-08-28 | Disposition: A | Discharge status: Home / Self Care | Payer: Managed Care, Other (non HMO) | Attending: Emergency Medicine | Admitting: Emergency Medicine

## 2020-08-28 ENCOUNTER — Other Ambulatory Visit: Payer: Self-pay

## 2020-08-28 DIAGNOSIS — R110 Nausea: Secondary | ICD-10-CM | POA: Insufficient documentation

## 2020-08-28 DIAGNOSIS — R1030 Lower abdominal pain, unspecified: Secondary | ICD-10-CM | POA: Insufficient documentation

## 2020-08-28 DIAGNOSIS — O26891 Other specified pregnancy related conditions, first trimester: Secondary | ICD-10-CM | POA: Diagnosis not present

## 2020-08-28 DIAGNOSIS — Z5321 Procedure and treatment not carried out due to patient leaving prior to being seen by health care provider: Secondary | ICD-10-CM | POA: Diagnosis not present

## 2020-08-28 DIAGNOSIS — Z3A00 Weeks of gestation of pregnancy not specified: Secondary | ICD-10-CM | POA: Insufficient documentation

## 2020-08-28 LAB — CBC
HCT: 44.2 % (ref 36.0–46.0)
Hemoglobin: 14.8 g/dL (ref 12.0–15.0)
MCH: 30.1 pg (ref 26.0–34.0)
MCHC: 33.5 g/dL (ref 30.0–36.0)
MCV: 90 fL (ref 80.0–100.0)
Platelets: 285 10*3/uL (ref 150–400)
RBC: 4.91 MIL/uL (ref 3.87–5.11)
RDW: 11.7 % (ref 11.5–15.5)
WBC: 7.2 10*3/uL (ref 4.0–10.5)
nRBC: 0 % (ref 0.0–0.2)

## 2020-08-28 LAB — BASIC METABOLIC PANEL
Anion gap: 8 (ref 5–15)
BUN: 7 mg/dL (ref 6–20)
CO2: 24 mmol/L (ref 22–32)
Calcium: 8.7 mg/dL — ABNORMAL LOW (ref 8.9–10.3)
Chloride: 103 mmol/L (ref 98–111)
Creatinine, Ser: 0.61 mg/dL (ref 0.44–1.00)
GFR, Estimated: >60 mL/min (ref >60)
Glucose, Bld: 91 mg/dL (ref 70–99)
Potassium: 3.8 mmol/L (ref 3.5–5.1)
Sodium: 135 mmol/L (ref 135–145)

## 2020-08-28 LAB — URINALYSIS, ROUTINE W REFLEX MICROSCOPIC
Bilirubin Urine: NEGATIVE
Glucose, UA: NEGATIVE mg/dL
Hgb urine dipstick: NEGATIVE
Ketones, ur: NEGATIVE mg/dL
Leukocytes,Ua: NEGATIVE
Nitrite: NEGATIVE
Protein, ur: NEGATIVE mg/dL
Specific Gravity, Urine: <1.005 — ABNORMAL LOW (ref 1.005–1.030)
pH: 6.5 (ref 5.0–8.0)

## 2020-08-28 LAB — HCG, QUANTITATIVE, PREGNANCY: hCG, Beta Chain, Quant, S: 250 m[IU]/mL — ABNORMAL HIGH (ref <5)

## 2020-08-28 NOTE — ED Notes (Signed)
Pt made aware that there is no [place] available. She stated she was leaving to go to another hospital.

## 2020-08-28 NOTE — ED Triage Notes (Signed)
Pt sent from PCP with positive pregnancy test and low abd pain x 3 days. Endorses nausea. Sent to r/o ectopic pregnancy.

## 2020-10-03 ENCOUNTER — Ambulatory Visit (INDEPENDENT_AMBULATORY_CARE_PROVIDER_SITE_OTHER): Payer: Managed Care, Other (non HMO) | Admitting: Obstetrics & Gynecology

## 2020-10-03 ENCOUNTER — Encounter: Payer: Self-pay | Admitting: Obstetrics & Gynecology

## 2020-10-03 ENCOUNTER — Other Ambulatory Visit (HOSPITAL_COMMUNITY)
Admission: RE | Admit: 2020-10-03 | Discharge: 2020-10-03 | Disposition: A | Discharge status: Home / Self Care | Payer: Managed Care, Other (non HMO) | Source: Ambulatory Visit | Attending: Obstetrics & Gynecology | Admitting: Obstetrics & Gynecology

## 2020-10-03 ENCOUNTER — Other Ambulatory Visit: Payer: Self-pay

## 2020-10-03 ENCOUNTER — Telehealth: Payer: Self-pay | Admitting: General Practice

## 2020-10-03 ENCOUNTER — Encounter: Payer: Managed Care, Other (non HMO) | Admitting: Obstetrics & Gynecology

## 2020-10-03 VITALS — BP 109/73 | HR 80 | Wt 113.0 lb

## 2020-10-03 DIAGNOSIS — Z3A09 9 weeks gestation of pregnancy: Secondary | ICD-10-CM | POA: Insufficient documentation

## 2020-10-03 DIAGNOSIS — Z3480 Encounter for supervision of other normal pregnancy, unspecified trimester: Secondary | ICD-10-CM

## 2020-10-03 DIAGNOSIS — R87620 Atypical squamous cells of undetermined significance on cytologic smear of vagina (ASC-US): Secondary | ICD-10-CM

## 2020-10-03 DIAGNOSIS — R87811 Vaginal high risk human papillomavirus (HPV) DNA test positive: Secondary | ICD-10-CM

## 2020-10-03 NOTE — Progress Notes (Signed)
History:   Tammy Mejia is a 30 y.o. G4P1020 at [redacted]w[redacted]d by early ultrasound being seen today for her first obstetrical visit.  Her obstetrical history is significant for two prior miscarriages. Patient does intend to breast feed. Pregnancy history fully reviewed.  Patient reports some cramping.  She had a little bit of bleeding 09/24/2020.       HISTORY: OB History  Gravida Para Term Preterm AB Living  4 1 1  0 2 0  SAB IAB Ectopic Multiple Live Births  2 0 0 0 0    # Outcome Date GA Lbr Len/2nd Weight Sex Delivery Anes PTL Lv  4 Current           3 Term 07/20/18 [redacted]w[redacted]d  6 lb 7 oz (2.92 kg) M Vag-Spont     2 SAB 07/2017          1 SAB 04/2017            Last pap smear was done 02/2019 and was normal with neg HR HPV  Past Medical History:  Diagnosis Date  . Anemia   . Anxiety   . 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 Back pain   . Deficiency of potassium   . Depression   . HPV in female   . Hypokalemia   . Migraine   . PID (pelvic inflammatory disease)    Past Surgical History:  Procedure Laterality Date  . DILATION AND EVACUATION N/A 05/13/2017   Procedure: DILATATION AND EVACUATION;  Surgeon: 05/15/2017, MD;  Location: [facility];  Service: Gynecology;  Laterality: N/A;   Family History  Problem Relation Age of Onset  . COPD Mother   . Diabetes Father   . Heart disease Father   . Cancer Neg Hx   . Hypertension Neg Hx   . Stroke Neg Hx    Social History   Tobacco Use  . Smoking status: Never Smoker  . Smokeless tobacco: Never Used  Vaping Use  . Vaping Use: Never used  Substance Use Topics  . Alcohol use: No  . Drug use: No   No Known Allergies Current Outpatient Medications on File Prior to Visit  Medication Sig Dispense Refill  . chlorhexidine (HIBICLENS) 4 % external liquid Apply topically every other day. (Patient not taking: Reported on 10/03/2020) 120 mL 0  . cyclobenzaprine (FLEXERIL) 10 MG tablet Take 0.5-1 tablets (5-10 mg total)  by mouth 3 (three) times daily as needed for muscle spasms. (Patient not taking: Reported on 10/03/2020) 21 tablet 0  . cyclobenzaprine (FLEXERIL) 10 MG tablet 1 tab po q hs prn sever pain or muscle spasms. (Patient not taking: Reported on 10/03/2020) 30 tablet 0  . diclofenac (VOLTAREN) 75 MG EC tablet Take 1 tablet (75 mg total) by mouth 2 (two) times daily with a meal. (Patient not taking: Reported on 10/03/2020) 60 tablet 2  . Prenatal Vit-Fe Fumarate-FA (MULTIVITAMIN-PRENATAL) 27-0.8 MG TABS tablet Take 1 tablet by mouth daily at 12 noon. (Patient not taking: Reported on 10/03/2020)     No current facility-administered medications on file prior to visit.    Review of Systems Pertinent items noted in HPI and remainder of comprehensive ROS otherwise negative.  Physical Exam:   Vitals:   10/03/20 1017  BP: 109/73  Pulse: 80  Weight: 113 lb (51.3 kg)     Bedside Ultrasound for FHR check: Viable intrauterine pregnancy with positive cardiac activity noted, fetal heart rate 174bpm Patient informed that  the ultrasound is considered a limited obstetric ultrasound and is not intended to be a complete ultrasound exam.  Patient also informed that the ultrasound is not being completed with the intent of assessing for fetal or placental anomalies or any pelvic abnormalities.  Explained that the purpose of today's ultrasound is to assess for fetal heart rate.  Patient acknowledges the purpose of the exam and the limitations of the study. General: well-developed, well-nourished female in no acute distress  Breasts:  normal appearance, no masses or tenderness bilaterally  Skin: normal coloration and turgor, no rashes  Neurologic: oriented, normal, negative, normal mood  Extremities: normal strength, tone, and muscle mass, ROM of all joints is normal  HEENT PERRLA, extraocular movement intact and sclera clear, anicteric  Neck supple and no masses  Cardiovascular: regular rate and rhythm  Respiratory:   no respiratory distress, normal breath sounds  Abdomen: soft, non-tender; bowel sounds normal; no masses,  no organomegaly  Pelvic: normal external genitalia, no lesions, normal vaginal mucosa, normal vaginal discharge, normal cervix, no  pap smear done. Uterine size:  10 weeks, soft    Assessment:    Pregnancy: [id number] Patient Active Problem List   Diagnosis Date Noted  . Depression affecting pregnancy, antepartum 01/30/2018  . 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 Anxiety and depression 03/16/2016  . Migraine 08/23/2014  . Palpitations 06/29/2013     Plan:    1. [redacted] weeks gestation of pregnancy - Enroll Patient in Babyscripts - Babyscripts Schedule Optimization - Urine Culture - CBC/D/Plt+RPR+Rh+ABO+Rub Ab... - GC/Chlamydia probe amp ([place])not at [facility]  2. ASCUS with positive high risk human papillomavirus of vagina - pap was 2019 with colpo showing no CIN.  Follow up pap/HR HPV 02/2019 was normal.  Follow up 3 years recommended per ASCCP guidelines.  3. Supervision of other normal pregnancy, antepartum   Initial labs drawn. Continue prenatal vitamins. Problem list reviewed and updated. Genetic Screening discussed, Quad screen and NIPS: requested. Ultrasound discussed; fetal anatomic survey: requested. Anticipatory guidance about prenatal visits given including labs, ultrasounds, and testing. Discussed usage of Babyscripts and virtual visits as additional source of managing and completing prenatal visits in midst of coronavirus and pandemic.   Encouraged to complete MyChart Registration for her ability to review results, send requests, and have questions addressed.  The nature of [place] [facility]Faculty Practice with multiple MDs and Advanced Practice Providers was explained to patient; also emphasized that residents, students are part of our team. Routine obstetric precautions reviewed. Encouraged to seek out care at office  or emergency room [facility] preferred) for urgent and/or emergent concerns. No follow-ups on file.     Lum Keas, MD, FACOG Obstetrician & Gynecologist, [facility] for [facility], [facility]

## 2020-10-03 NOTE — Progress Notes (Signed)
Patient states she had some spotting last week on Jan 5th, 2022.   Patient on her cell phone texting during most of intake. Armandina Stammer RN   DATING AND VIABILITY SONOGRAM   Tammy Mejia is a 30 y.o. year old G73P1020 with LMP Patient's last menstrual period was 07/27/2020. which would correlate to  [redacted]w[redacted]d weeks gestation.  She has regular menstrual cycles.   She is here today for a confirmatory initial sonogram.    GESTATION: SINGLETON     FETAL ACTIVITY:          Heart rate        174          The fetus is active.    ADNEXA: The ovaries are normal.   GESTATIONAL AGE AND  BIOMETRICS:  Gestational criteria: Estimated Date of Delivery: 05/03/21 by LMP now at [redacted]w[redacted]d  Previous Scans:0      CROWN RUMP LENGTH           2.15 cm        8-6 weeks   2.29 cm   9-0 weeks                                                                           AVERAGE EGA(BY THIS SCAN):  8-6weeks  EDC:05-09-2021     TECHNICIAN COMMENTS: Patient informed that the ultrasound is considered a limited obstetric ultrasound and is not intended to be a complete ultrasound exam. Patient also informed that the ultrasound is not being completed with the intent of assessing for fetal or placental anomalies or any pelvic abnormalities. Explained that the purpose of today's ultrasound is to assess for fetal heart rate. Patient acknowledges the purpose of the exam and the limitations of the study.     Armandina Stammer 10/03/2020 10:43 AM

## 2020-10-04 DIAGNOSIS — Z3480 Encounter for supervision of other normal pregnancy, unspecified trimester: Secondary | ICD-10-CM | POA: Insufficient documentation

## 2020-10-04 LAB — CBC/D/PLT+RPR+RH+ABO+RUB AB...
Antibody Screen: NEGATIVE
Basophils Absolute: 0 10*3/uL (ref 0.0–0.2)
Basos: 0 %
EOS (ABSOLUTE): 0.1 10*3/uL (ref 0.0–0.4)
Eos: 1 %
HCV Ab: <0.1 s/co ratio (ref 0.0–0.9)
HIV Screen 4th Generation wRfx: NONREACTIVE
Hematocrit: 38.1 % (ref 34.0–46.6)
Hemoglobin: 13 g/dL (ref 11.1–15.9)
Hepatitis B Surface Ag: NEGATIVE
Immature Grans (Abs): 0 10*3/uL (ref 0.0–0.1)
Immature Granulocytes: 0 %
Lymphocytes Absolute: 1.8 10*3/uL (ref 0.7–3.1)
Lymphs: 18 %
MCH: 29.9 pg (ref 26.6–33.0)
MCHC: 34.1 g/dL (ref 31.5–35.7)
MCV: 88 fL (ref 79–97)
Monocytes Absolute: 0.9 10*3/uL (ref 0.1–0.9)
Monocytes: 10 %
Neutrophils Absolute: 6.8 10*3/uL (ref 1.4–7.0)
Neutrophils: 71 %
Platelets: 332 10*3/uL (ref 150–450)
RBC: 4.35 x10E6/uL (ref 3.77–5.28)
RDW: 12 % (ref 11.7–15.4)
RPR Ser Ql: NONREACTIVE
Rh Factor: POSITIVE
Rubella Antibodies, IGG: 2.82 index (ref >0.99)
WBC: 9.6 10*3/uL (ref 3.4–10.8)

## 2020-10-04 LAB — HCV INTERPRETATION

## 2020-10-06 LAB — GC/CHLAMYDIA PROBE AMP (~~LOC~~) NOT AT ARMC
Chlamydia: NEGATIVE
Comment: NEGATIVE
Comment: NORMAL
Neisseria Gonorrhea: NEGATIVE

## 2020-10-06 LAB — URINE CULTURE: Organism ID, Bacteria: NO GROWTH

## 2020-10-09 ENCOUNTER — Telehealth (INDEPENDENT_AMBULATORY_CARE_PROVIDER_SITE_OTHER): Payer: Managed Care, Other (non HMO) | Admitting: Family

## 2020-10-09 ENCOUNTER — Other Ambulatory Visit: Payer: Self-pay

## 2020-10-09 ENCOUNTER — Other Ambulatory Visit (HOSPITAL_BASED_OUTPATIENT_CLINIC_OR_DEPARTMENT_OTHER): Payer: Self-pay | Admitting: Family

## 2020-10-09 DIAGNOSIS — G43909 Migraine, unspecified, not intractable, without status migrainosus: Secondary | ICD-10-CM

## 2020-10-09 MED ORDER — ACETAMINOPHEN-CODEINE #3 300-30 MG PO TABS
1.0000 | ORAL_TABLET | Freq: Four times a day (QID) | ORAL | 0 refills | Status: DC | PRN
Start: 2020-10-09 — End: 2020-12-04

## 2020-10-09 MED FILL — ACETAMINOPHEN-COD #3 TABLET: 300-30 | 3 days supply | Qty: 10 | Fill #0

## 2020-10-09 NOTE — Progress Notes (Signed)
Virtual Visit via Video Note  I connected with Tammy Mejia on 10/09/20 at  2:20 PM EST by a video enabled telemedicine application and verified that I am speaking with the correct person using two identifiers.  Location: Patient: home Provider: work    I discussed the limitations of evaluation and management by telemedicine and the availability of in person appointments. The patient expressed understanding and agreed to proceed. Only the patient and myself were present for today's video call.   History of Present Illness:  Patient is a 30 yr old female ([redacted] weeks pregnant) who presents today with chief complaint of migraine headache. She has hx of migraines.  This migraine began mid-day yesterday. She reports that her headache is in the front of her head.  She reports that it feels "like somebody is knocking."  She reports light and noise sensitivity.  She reports + nausea, no vomiting.  She has tried tylenol without improvement.    Observations/Objective:   Gen: Awake, alert, no acute distress Resp: Breathing is even and non-labored Psych: calm/pleasant demeanor Neuro: Alert and Oriented x 3, + facial symmetry, speech is clear.   Assessment and Plan:  Migraine- not improved with tylenol. Will give a few tylenol #3 to use sparingly for severe headache. She is advised to follow up with her OB if symptoms fail to improve or if they become recurrent in nature.  Plan reviewed with Dr. Abner Greenspan.   Follow Up Instructions:    I discussed the assessment and treatment plan with the patient. The patient was provided an opportunity to ask questions and all were answered. The patient agreed with the plan and demonstrated an understanding of the instructions.   The patient was advised to call back or seek an in-person evaluation if the symptoms worsen or if the condition fails to improve as anticipated.  Lemont Fillers, NP

## 2020-10-22 ENCOUNTER — Encounter: Payer: Self-pay | Admitting: Family Medicine

## 2020-10-22 ENCOUNTER — Ambulatory Visit (INDEPENDENT_AMBULATORY_CARE_PROVIDER_SITE_OTHER): Payer: Managed Care, Other (non HMO) | Admitting: Family Medicine

## 2020-10-22 ENCOUNTER — Other Ambulatory Visit: Payer: Self-pay

## 2020-10-22 VITALS — BP 102/56 | HR 54 | Wt 113.0 lb

## 2020-10-22 DIAGNOSIS — Z3480 Encounter for supervision of other normal pregnancy, unspecified trimester: Secondary | ICD-10-CM

## 2020-10-22 DIAGNOSIS — Z3A11 11 weeks gestation of pregnancy: Secondary | ICD-10-CM

## 2020-10-22 DIAGNOSIS — R87610 Atypical squamous cells of undetermined significance on cytologic smear of cervix (ASC-US): Secondary | ICD-10-CM

## 2020-10-22 DIAGNOSIS — R87810 Cervical high risk human papillomavirus (HPV) DNA test positive: Secondary | ICD-10-CM

## 2020-10-22 NOTE — Addendum Note (Signed)
Addended by: Anell Barr on: 10/22/2020 02:47 PM   Modules accepted: Orders

## 2020-10-22 NOTE — Progress Notes (Signed)
   PRENATAL VISIT NOTE  Subjective:  Tammy Mejia is a 30 y.o. G4P1020 at [redacted]w[redacted]d being seen today for ongoing prenatal care.  She is currently monitored for the following issues for this low-risk pregnancy and has Palpitations; Migraine; Anxiety and depression; ASCUS with positive high risk HPV cervical; and Supervision of other normal pregnancy, antepartum on their problem list.  Patient reports no complaints.   .  .   . Denies leaking of fluid.   The following portions of the patient's history were reviewed and updated as appropriate: allergies, current medications, past family history, past medical history, past social history, past surgical history and problem list.   Objective:   Vitals:   10/22/20 1354  BP: (!) 102/56  Pulse: (!) 54  Weight: 113 lb (51.3 kg)    Fetal Status:           General:  Alert, oriented and cooperative. Patient is in no acute distress.  Skin: Skin is warm and dry. No rash noted.   Cardiovascular: Normal heart rate noted  Respiratory: Normal respiratory effort, no problems with respiration noted  Abdomen: Soft, gravid, appropriate for gestational age.        Pelvic: Cervical exam deferred        Extremities: Normal range of motion.     Mental Status: Normal mood and affect. Normal behavior. Normal judgment and thought content.   Assessment and Plan:  Pregnancy: G4P1020 at [redacted]w[redacted]d 1. Supervision of other normal pregnancy, antepartum FHT and FH normal  2. ASCUS with positive high risk HPV cervical Last pap normal (02/2019)  Preterm labor symptoms and general obstetric precautions including but not limited to vaginal bleeding, contractions, leaking of fluid and fetal movement were reviewed in detail with the patient. Please refer to After Visit Summary for other counseling recommendations.   No follow-ups on file.  No future appointments.  Levie Heritage, DO

## 2020-10-30 ENCOUNTER — Other Ambulatory Visit: Payer: Self-pay

## 2020-10-30 ENCOUNTER — Encounter (HOSPITAL_BASED_OUTPATIENT_CLINIC_OR_DEPARTMENT_OTHER): Payer: Self-pay | Admitting: Emergency Medicine

## 2020-10-30 ENCOUNTER — Emergency Department (HOSPITAL_BASED_OUTPATIENT_CLINIC_OR_DEPARTMENT_OTHER)
Admission: EM | Admit: 2020-10-30 | Discharge: 2020-10-30 | Disposition: A | Discharge status: Home / Self Care | Payer: Managed Care, Other (non HMO) | Attending: Emergency Medicine | Admitting: Emergency Medicine

## 2020-10-30 ENCOUNTER — Emergency Department (HOSPITAL_BASED_OUTPATIENT_CLINIC_OR_DEPARTMENT_OTHER): Payer: Managed Care, Other (non HMO)

## 2020-10-30 DIAGNOSIS — Z3A12 12 weeks gestation of pregnancy: Secondary | ICD-10-CM | POA: Insufficient documentation

## 2020-10-30 DIAGNOSIS — R1031 Right lower quadrant pain: Secondary | ICD-10-CM | POA: Diagnosis not present

## 2020-10-30 DIAGNOSIS — O26891 Other specified pregnancy related conditions, first trimester: Secondary | ICD-10-CM | POA: Diagnosis present

## 2020-10-30 DIAGNOSIS — R8271 Bacteriuria: Secondary | ICD-10-CM

## 2020-10-30 DIAGNOSIS — O2391 Unspecified genitourinary tract infection in pregnancy, first trimester: Secondary | ICD-10-CM | POA: Diagnosis not present

## 2020-10-30 LAB — WET PREP, GENITAL
Clue Cells Wet Prep HPF POC: NONE SEEN
Sperm: NONE SEEN
Trich, Wet Prep: NONE SEEN
Yeast Wet Prep HPF POC: NONE SEEN

## 2020-10-30 LAB — COMPREHENSIVE METABOLIC PANEL
ALT: 8 U/L (ref 0–44)
AST: 15 U/L (ref 15–41)
Albumin: 3.2 g/dL — ABNORMAL LOW (ref 3.5–5.0)
Alkaline Phosphatase: 64 U/L (ref 38–126)
Anion gap: 9 (ref 5–15)
BUN: 5 mg/dL — ABNORMAL LOW (ref 6–20)
CO2: 22 mmol/L (ref 22–32)
Calcium: 8.6 mg/dL — ABNORMAL LOW (ref 8.9–10.3)
Chloride: 104 mmol/L (ref 98–111)
Creatinine, Ser: 0.5 mg/dL (ref 0.44–1.00)
GFR, Estimated: >60 mL/min (ref >60)
Glucose, Bld: 87 mg/dL (ref 70–99)
Potassium: 3.8 mmol/L (ref 3.5–5.1)
Sodium: 135 mmol/L (ref 135–145)
Total Bilirubin: 0.3 mg/dL (ref 0.3–1.2)
Total Protein: 7 g/dL (ref 6.5–8.1)

## 2020-10-30 LAB — URINALYSIS, ROUTINE W REFLEX MICROSCOPIC
Bilirubin Urine: NEGATIVE
Glucose, UA: NEGATIVE mg/dL
Hgb urine dipstick: NEGATIVE
Ketones, ur: NEGATIVE mg/dL
Nitrite: NEGATIVE
Protein, ur: NEGATIVE mg/dL
Specific Gravity, Urine: 1.01 (ref 1.005–1.030)
pH: 7.5 (ref 5.0–8.0)

## 2020-10-30 LAB — CBC
HCT: 36.7 % (ref 36.0–46.0)
Hemoglobin: 12.5 g/dL (ref 12.0–15.0)
MCH: 30.4 pg (ref 26.0–34.0)
MCHC: 34.1 g/dL (ref 30.0–36.0)
MCV: 89.3 fL (ref 80.0–100.0)
Platelets: 283 10*3/uL (ref 150–400)
RBC: 4.11 MIL/uL (ref 3.87–5.11)
RDW: 12.7 % (ref 11.5–15.5)
WBC: 8.6 10*3/uL (ref 4.0–10.5)
nRBC: 0 % (ref 0.0–0.2)

## 2020-10-30 LAB — PREGNANCY, URINE: Preg Test, Ur: POSITIVE — AB

## 2020-10-30 LAB — LIPASE, BLOOD: Lipase: 29 U/L (ref 11–51)

## 2020-10-30 LAB — URINALYSIS, MICROSCOPIC (REFLEX)

## 2020-10-30 LAB — HCG, QUANTITATIVE, PREGNANCY: hCG, Beta Chain, Quant, S: 83966 m[IU]/mL — ABNORMAL HIGH (ref <5)

## 2020-10-30 MED ORDER — SODIUM CHLORIDE 0.9 % IV BOLUS: 1000.0000 mL | Freq: Once | INTRAVENOUS | Status: DC

## 2020-10-30 MED ORDER — ACETAMINOPHEN 325 MG PO TABS
650.0000 mg | ORAL_TABLET | Freq: Once | ORAL | Status: AC
  Administered 2020-10-30: 650 mg via ORAL
  Filled 2020-10-30: qty 2

## 2020-10-30 MED ORDER — CEPHALEXIN 500 MG PO CAPS: 500.0000 mg | ORAL_CAPSULE | Freq: Two times a day (BID) | ORAL | 0 refills | Status: AC

## 2020-10-30 NOTE — Discharge Instructions (Addendum)
I was unable to rule out appendicitis based on your work-up today. I recommended additional imaging, an MRI to rule this out which would require transfer to another facility. You will need to reassess your symptoms within 24 hours. I am concerned that there could be another reason for your pain other than related to your pregnancy. If your symptoms worsen in any time before or after then, please return to this ER or any other ER for additional work-up to rule out appendicitis or other structural cause of your symptoms. We discussed the risk of CT to the fetus which is why I feel an MRI would be best. In the meantime we will treat with antibiotics as you had bacteria in your urine and though you are asymptomatic, it is recommended to treat pregnant women with antibiotics. Return to the ER for worsening pain, fever, shortness of breath, vaginal bleeding.

## 2020-10-30 NOTE — ED Triage Notes (Addendum)
Reports RLQ abdominal pain since Tuesday with some nausea.  Also reports no bm for several days.  Had hard small stool this morning.  Also reports being [redacted] weeks pregnant.

## 2020-10-30 NOTE — ED Provider Notes (Signed)
[facility] EMERGENCY DEPARTMENT Provider Note   CSN: [id number] Arrival date & time: 10/30/20  0944     History Chief Complaint  Patient presents with  . Abdominal Pain    Tammy Mejia is a 30 y.o. female who is currently pregnant at 6612 weeks gestation, history of PID presenting to the ED with a chief complaint of right lower quadrant abdominal pain.  For the past 2 days has been having constant right lower quadrant pain that will intermittently radiate to her right lower back.  She has been pregnant in the past and states that this does not feel like anything related to the pregnancy.  Patient had her last OB ultrasound about 1 week ago and was told that "everything was okay, the baby was in the right spot."  She denies any pregnancy related complaints such as abnormal vaginal bleeding, vaginal discharge.  She does report some vomiting.  She has had a decrease in bowel movements over the past 3 to 4 days.  States that "I am having bowel movements but I have to strain for them."  Denies any bloody stools.  No prior abdominal surgeries.  Denies any dysuria, hematuria, fever, history of pyelonephritis.  HPI     Past Medical History:  Diagnosis Date  . Anemia   . Anxiety   . 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 Back pain   . Deficiency of potassium   . Depression   . HPV in female   . Hypokalemia   . Migraine   . PID (pelvic inflammatory disease)     Patient Active Problem List   Diagnosis Date Noted  . Supervision of other normal pregnancy, antepartum 10/04/2020  . 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 Anxiety and depression 03/16/2016  . Migraine 08/23/2014  . Palpitations 06/29/2013    Past Surgical History:  Procedure Laterality Date  . DILATION AND EVACUATION N/A 05/13/2017   Procedure: DILATATION AND EVACUATION;  Surgeon: Reva BoresPratt, Tanya S, MD;  Location: [facility];  Service: Gynecology;  Laterality: N/A;     OB History     Gravida  4   Para  1   Term  1   Preterm      AB  2   Living  0     SAB  2   IAB      Ectopic      Multiple  0   Live Births              Family History  Problem Relation Age of Onset  . COPD Mother   . Diabetes Father   . Heart disease Father   . Cancer Neg Hx   . Hypertension Neg Hx   . Stroke Neg Hx     Social History   Tobacco Use  . Smoking status: Never Smoker  . Smokeless tobacco: Never Used  . Tobacco comment: smokes hooka without nicotine  Vaping Use  . Vaping Use: Never used  Substance Use Topics  . Alcohol use: No  . Drug use: No    Home Medications Prior to Admission medications   Medication Sig Start Date End Date Taking? Authorizing Provider  cephALEXin (KEFLEX) 500 MG capsule Take 1 capsule (500 mg total) by mouth 2 (two) times daily for 7 days. 10/30/20 11/06/20 Yes Ceanna Wareing, PA-C  acetaminophen-codeine (TYLENOL #3) 300-30 MG tablet Take 1 tablet by mouth every 6 (six) hours as needed for severe pain.  Patient not taking: Reported on 10/22/2020 10/09/20   Sandford Craze, NP  cyclobenzaprine (FLEXERIL) 10 MG tablet 1 tab po q hs prn sever pain or muscle spasms. Patient not taking: No sig reported 06/24/20   Saguier, Ramon Dredge, PA-C  diclofenac (VOLTAREN) 75 MG EC tablet Take 1 tablet (75 mg total) by mouth 2 (two) times daily with a meal. Patient not taking: No sig reported 10/10/18   Levie Heritage, DO  Prenatal Vit-Fe Fumarate-FA (MULTIVITAMIN-PRENATAL) 27-0.8 MG TABS tablet Take 1 tablet by mouth daily at 12 noon.    [provider]    Allergies    Patient has no known allergies.  Review of Systems   Review of Systems  Constitutional: Negative for appetite change, chills and fever.  HENT: Negative for ear pain, rhinorrhea, sneezing and sore throat.   Eyes: Negative for photophobia and visual disturbance.  Respiratory: Negative for cough, chest tightness, shortness of breath and wheezing.   Cardiovascular: Negative  for chest pain and palpitations.  Gastrointestinal: Positive for abdominal pain. Negative for blood in stool, constipation, diarrhea, nausea and vomiting.  Genitourinary: Negative for dysuria, hematuria, urgency, vaginal bleeding and vaginal discharge.  Musculoskeletal: Negative for myalgias.  Skin: Negative for rash.  Neurological: Negative for dizziness, weakness and light-headedness.    Physical Exam Updated Vital Signs BP 94/63 (BP Location: Left Arm)   Pulse 73   Temp 98.6 F (37 C) (Oral)   Resp 16   Ht 5\' 1"  (1.549 m)   Wt 51.3 kg   LMP 07/27/2020   SpO2 98%   BMI 21.35 kg/m   Physical Exam Vitals and nursing note reviewed. Exam conducted with a chaperone present.  Constitutional:      General: She is not in acute distress.    Appearance: She is well-developed and well-nourished.  HENT:     Head: Normocephalic and atraumatic.     Nose: Nose normal.  Eyes:     General: No scleral icterus.       Left eye: No discharge.     Extraocular Movements: EOM normal.     Conjunctiva/sclera: Conjunctivae normal.  Cardiovascular:     Rate and Rhythm: Normal rate and regular rhythm.     Pulses: Intact distal pulses.     Heart sounds: Normal heart sounds. No murmur heard. No friction rub. No gallop.   Pulmonary:     Effort: Pulmonary effort is normal. No respiratory distress.     Breath sounds: Normal breath sounds.  Abdominal:     General: Bowel sounds are normal. There is no distension.     Palpations: Abdomen is soft.     Tenderness: There is abdominal tenderness in the right lower quadrant. There is no guarding.  Genitourinary:    Vagina: No foreign body. Vaginal discharge present.     Cervix: No cervical motion tenderness or discharge.     Uterus: Not tender.      Adnexa:        Right: Tenderness present.        Left: No tenderness.       Comments: Pelvic exam: normal external genitalia without evidence of trauma. VULVA: normal appearing vulva with no masses,  tenderness or lesion. VAGINA: normal appearing vagina with normal color and discharge, no lesions. CERVIX: normal appearing cervix without lesions, cervical motion tenderness absent, cervical os closed with out purulent discharge; No vaginal discharge. Wet prep and DNA probe for chlamydia and GC obtained.   ADNEXA: normal adnexa in size, some tenderness  noted on right adnexa on exam.  No masses. UTERUS: uterus is normal size, shape, consistency and nontender.   Musculoskeletal:        General: No edema. Normal range of motion.     Cervical back: Normal range of motion and neck supple.  Skin:    General: Skin is warm and dry.     Findings: No rash.  Neurological:     Mental Status: She is alert.     Motor: No abnormal muscle tone.     Coordination: Coordination normal.  Psychiatric:        Mood and Affect: Mood and affect normal.     ED Results / Procedures / Treatments   Labs (all labs ordered are listed, but only abnormal results are displayed) Labs Reviewed  WET PREP, GENITAL - Abnormal; Notable for the following components:      Result Value   WBC, Wet Prep HPF POC MANY (*)    All other components within normal limits  COMPREHENSIVE METABOLIC PANEL - Abnormal; Notable for the following components:   BUN 5 (*)    Calcium 8.6 (*)    Albumin 3.2 (*)    All other components within normal limits  URINALYSIS, ROUTINE W REFLEX MICROSCOPIC - Abnormal; Notable for the following components:   APPearance HAZY (*)    Leukocytes,Ua SMALL (*)    All other components within normal limits  URINALYSIS, MICROSCOPIC (REFLEX) - Abnormal; Notable for the following components:   Bacteria, UA MANY (*)    All other components within normal limits  HCG, QUANTITATIVE, PREGNANCY - Abnormal; Notable for the following components:   hCG, Beta Chain, Quant, S 83,966 (*)    All other components within normal limits  PREGNANCY, URINE - Abnormal; Notable for the following components:   Preg Test, Ur  POSITIVE (*)    All other components within normal limits  URINE CULTURE  LIPASE, BLOOD  CBC  GC/CHLAMYDIA PROBE AMP ([place]) NOT AT [facility]    EKG None  Radiology US OB Comp < 14 Wks  Result Date: 10/30/2020 CLINICAL DATA:  Right lower quadrant abdominal pain, quantitative beta hCG = 83,966 EXAM: OBSTETRIC <14 WK ULTRASOUND TECHNIQUE: Transabdominal ultrasound was performed for evaluation of the gestation as well as the maternal uterus and adnexal regions. COMPARISON:  None. FINDINGS: Intrauterine gestational sac: Single Yolk sac:  Not Visualized. Embryo:  Visualized. Cardiac Activity: Visualized. Heart Rate: 139 bpm CRL:   67.8 mm   13 w 1 d                  [place] EDC: 05/06/2021 Subchorionic hemorrhage:  None visualized. Maternal uterus/adnexae: Maternal uterus is otherwise unremarkable. Thick-walled peripherally vascular probable corpus luteum seen in the left ovary measuring 1.2 x 1.5 x 1.3 cm. No concerning adnexal lesions. No pelvic free fluid is seen. IMPRESSION: Single viable intrauterine gestation at 13 weeks, 1 days by crown-rump length sonographic estimation. No acute sonographic complication. Nonvisualization of the yolk sac likely reflects typical physiologic involution which occurs after the tenth week. Probable left ovarian corpus luteum. Electronically Signed   By: Kreg Shropshire M.D.   On: 10/30/2020 16:49   US APPENDIX (ABDOMEN LIMITED)  Result Date: 10/30/2020 CLINICAL DATA:  Right lower quadrant pain, pregnant, WBC equals 8.6k EXAM: ULTRASOUND ABDOMEN LIMITED TECHNIQUE: Wallace Cullens scale imaging of the right lower quadrant was performed to evaluate for suspected appendicitis. Standard imaging planes and graded compression technique were utilized. COMPARISON:  None. FINDINGS: The appendix is not  visualized. Ancillary findings: None. Factors affecting image quality: None. Other findings: None. IMPRESSION: Non visualization of the appendix. Non-visualization of appendix by [place] does not  definitely exclude appendicitis. Electronically Signed   By: Kreg Shropshire M.D.   On: 10/30/2020 16:53    Procedures Procedures   Medications Ordered in ED Medications  sodium chloride 0.9 % bolus 1,000 mL (0 mLs Intravenous Hold 10/30/20 1355)  acetaminophen (TYLENOL) tablet 650 mg (650 mg Oral Given 10/30/20 1525)    ED Course  I have reviewed the triage vital signs and the nursing notes.  Pertinent labs & imaging results that were available during my care of the patient were reviewed by me and considered in my medical decision making (see chart for details).  Clinical Course as of 10/30/20 1756  Thu Oct 30, 2020  1400 I spoke to radiologist regarding imaging choice for concern for appendicitis in this pregnant female.  He states that we can start with an ultrasound as this can show an appendicitis and therefore will not need additional imaging.  He does recommend that if additional imaging is warranted that she be transferred to [facility] for an MRI if that is a reasonable option.  I discussed this with the patient and she agrees that she does not want a CT scan but is willing to start with an ultrasound to rule out appendicitis. [HK]  1727 Patient now tells me that she is considering CT scan.  She would like someone to explain to her the risk of radiation exposure to her fetus.  I explained to her that MRI would be safest in this instance due to her pregnancy but she states that "I do not want to get transferred and then have to wait another 8 hours for something." [HK]    Clinical Course User Index [HK] Dietrich Pates, PA-C   MDM Rules/Calculators/A&P                          30 year old female who is currently pregnant at [redacted] weeks gestation with history of PID presenting to the ED with a chief complaint of right lower quadrant abdominal pain. Reports for the past 2 days has been having constant right lower quadrant abdominal pain that will intermittently radiate to her right lower back. She  has been pregnant in the past and states that this does not feel like anything related to her pregnancy. Last OB ultrasound was 1 week ago and she was told that there was no complications noted. Denies any vaginal bleeding, vaginal discharge. She has had a decrease in bowel movements over the past 2 days as well but states that she is still having bowel movements. No bloody stools. No dysuria, hematuria, fever. On exam there is tenderness to the right lower quadrant. No specific CVA tenderness. Pelvic exam revealed right adnexal tenderness without any uterine tenderness or cervical motion tenderness. Work-up here significant for bacteriuria with many bacteria and small leukocytes. hCG is elevated to 83,000. CMP unremarkable. CBC without leukocytosis. Due to her persistent right lower quadrant pain despite her normal WBC count I am concerned that she may have appendicitis. I had a lengthy discussion with the patient regarding imaging modalities as well as risk for each. We started with a pelvic ultrasound as well as an appendix ultrasound as this would be safest and may offer insight into her reason for pain. Pelvic ultrasound as well as appendix ultrasound are unremarkable, appendix was not visualized. Patient initially told  me that she would be willing to be transferred to [facility] for an MRI as after speaking to the radiologist I feel that this would be safest as this is an option for [place]. After her ultrasound results, I went back and told her and she instead told me that she would like to discuss what specifically risks are associated with the CT. I got in touch with our radiology tech who gave patient informed consent paperwork which listed the quantified risk. I did explain to patient multiple times that the safest imaging modality again would be MRI. She is frustrated because she states that "I don't want to be transferred and then have to wait another 8 hours for results." I did understand her frustration but I  thoroughly went over risk numerous not only of CT scan but also undiagnosed appendicitis that could lead to rupture, severe infection or death, or any other intra-abdominal pathology that we are unable to diagnose at this time. I mentioned this several times. Told her that if she absolutely does not want to be transferred we could do a CT scan.  Patient does not feel completely comfortable with this either.  At the end of our lengthy discussion, she prefers being discharged home for possible recheck in 24 hours if her symptoms worsen.  I told her that even if her symptoms worsen prior to 24-hour check she will need to come back to this ER or any other ER to be evaluated again as this could signify surgical or medical emergency.  Patient agrees to plan and states that she will come back if she feels worse.  In the meantime I will treat her with antibiotics for her asymptomatic bacteriuria.  An After Visit Summary was printed and given to the patient.   Portions of this note were generated with Scientist, clinical (histocompatibility and immunogenetics). Dictation errors may occur despite best attempts at proofreading.  Final Clinical Impression(s) / ED Diagnoses Final diagnoses:  RLQ abdominal pain  Asymptomatic bacteriuria    Rx / DC Orders ED Discharge Orders         Ordered    cephALEXin (KEFLEX) 500 MG capsule  2 times daily        10/30/20 1753           Dietrich Pates, PA-C 10/30/20 1814    Vanetta Mulders, MD 10/31/20 [phone]

## 2020-10-31 ENCOUNTER — Telehealth: Payer: Self-pay | Admitting: Family Medicine

## 2020-10-31 LAB — GC/CHLAMYDIA PROBE AMP (~~LOC~~) NOT AT ARMC
Chlamydia: NEGATIVE
Comment: NEGATIVE
Comment: NORMAL
Neisseria Gonorrhea: NEGATIVE

## 2020-10-31 LAB — URINE CULTURE: Culture: <10000 — AB

## 2020-10-31 NOTE — Telephone Encounter (Signed)
Patient is having right side pain she requesting an MRI so she can rule out appendicitis

## 2020-10-31 NOTE — Telephone Encounter (Signed)
She needs to schedule an appointment with Dr. Carmelia Roller to be evaluated

## 2020-10-31 NOTE — Telephone Encounter (Signed)
Patient called to check the status of order placement for MRI

## 2020-11-10 ENCOUNTER — Telehealth: Payer: Self-pay

## 2020-11-10 NOTE — Telephone Encounter (Signed)
Pt called stating she started feeling light headed and dizzy at work. Pt states she checked her BP. BP was 112/85 and 118/79. Pt aware that BP is normal and the dizziness and lightheadedness could be due to hormonal changes in pregnancy she should try to rest for the rest of the day if possible. Pt advised to go to [facility] at [facility] if she notices any leakage of fluids, bleeding, or if she feels symptoms have worsened.Understanding was voiced. Christiana Gurevich l Carsten Carstarphen, CMA

## 2020-11-19 ENCOUNTER — Other Ambulatory Visit: Payer: Self-pay

## 2020-11-19 ENCOUNTER — Ambulatory Visit (INDEPENDENT_AMBULATORY_CARE_PROVIDER_SITE_OTHER): Payer: Managed Care, Other (non HMO) | Admitting: Family Medicine

## 2020-11-19 VITALS — BP 110/67 | HR 70 | Wt 115.0 lb

## 2020-11-19 DIAGNOSIS — Z3480 Encounter for supervision of other normal pregnancy, unspecified trimester: Secondary | ICD-10-CM

## 2020-11-19 DIAGNOSIS — Z3A15 15 weeks gestation of pregnancy: Secondary | ICD-10-CM

## 2020-11-19 NOTE — Progress Notes (Signed)
   PRENATAL VISIT NOTE  Subjective:  Tammy Mejia is a 30 y.o. G4P1020 at [redacted]w[redacted]d being seen today for ongoing prenatal care.  She is currently monitored for the following issues for this low-risk pregnancy and has Palpitations; Migraine; Anxiety and depression; ASCUS with positive high risk HPV cervical; and Supervision of other normal pregnancy, antepartum on their problem list.  Patient reports no complaints.  Contractions: Not present. Vag. Bleeding: None.  Movement: Absent. Denies leaking of fluid.   The following portions of the patient's history were reviewed and updated as appropriate: allergies, current medications, past family history, past medical history, past social history, past surgical history and problem list.   Objective:   Vitals:   11/19/20 1348  BP: 110/67  Pulse: 70  Weight: 115 lb (52.2 kg)    Fetal Status: Fetal Heart Rate (bpm): 155 Fundal Height: 15 cm Movement: Absent     General:  Alert, oriented and cooperative. Patient is in no acute distress.  Skin: Skin is warm and dry. No rash noted.   Cardiovascular: Normal heart rate noted  Respiratory: Normal respiratory effort, no problems with respiration noted  Abdomen: Soft, gravid, appropriate for gestational age.  Pain/Pressure: Absent     Pelvic: Cervical exam deferred        Extremities: Normal range of motion.  Edema: Trace  Mental Status: Normal mood and affect. Normal behavior. Normal judgment and thought content.   Assessment and Plan:  Pregnancy: G4P1020 at [redacted]w[redacted]d 1. [redacted] weeks gestation of pregnancy  2. Supervision of other normal pregnancy, antepartum FHT and FH normal. Has [place] scheduled  Preterm labor symptoms and general obstetric precautions including but not limited to vaginal bleeding, contractions, leaking of fluid and fetal movement were reviewed in detail with the patient. Please refer to After Visit Summary for other counseling recommendations.   Return in about 4 weeks (around  12/17/2020) for OB f/u, In Office.  Future Appointments  Date Time Provider Department Center  12/12/2020 12:45 PM [facility]  12/18/2020  2:45 PM Levie Heritage, DO [facility] None    Levie Heritage, DO

## 2020-12-04 ENCOUNTER — Other Ambulatory Visit: Payer: Self-pay

## 2020-12-04 ENCOUNTER — Other Ambulatory Visit: Payer: Self-pay | Admitting: Obstetrics & Gynecology

## 2020-12-04 ENCOUNTER — Ambulatory Visit (INDEPENDENT_AMBULATORY_CARE_PROVIDER_SITE_OTHER): Payer: Managed Care, Other (non HMO) | Admitting: Obstetrics & Gynecology

## 2020-12-04 ENCOUNTER — Encounter: Payer: Self-pay | Admitting: General Practice

## 2020-12-04 VITALS — BP 96/50 | HR 61 | Wt 116.0 lb

## 2020-12-04 DIAGNOSIS — O99342 Other mental disorders complicating pregnancy, second trimester: Secondary | ICD-10-CM

## 2020-12-04 DIAGNOSIS — Z3480 Encounter for supervision of other normal pregnancy, unspecified trimester: Secondary | ICD-10-CM

## 2020-12-04 DIAGNOSIS — O2340 Unspecified infection of urinary tract in pregnancy, unspecified trimester: Secondary | ICD-10-CM

## 2020-12-04 DIAGNOSIS — F32A Depression, unspecified: Secondary | ICD-10-CM

## 2020-12-04 LAB — POCT URINALYSIS DIPSTICK
Appearance: NORMAL
Bilirubin, UA: NEGATIVE
Glucose, UA: NEGATIVE
Ketones, UA: NEGATIVE
Nitrite, UA: NEGATIVE
Protein, UA: NEGATIVE
Spec Grav, UA: 1.015 (ref 1.010–1.025)
Urobilinogen, UA: 1 E.U./dL
pH, UA: 7.5 (ref 5.0–8.0)

## 2020-12-04 MED ORDER — SERTRALINE HCL 50 MG PO TABS: 50.0000 mg | ORAL_TABLET | Freq: Every day | ORAL | 3 refills | Status: DC

## 2020-12-04 MED ORDER — NITROFURANTOIN MONOHYD MACRO 100 MG PO CAPS: 100.0000 mg | ORAL_CAPSULE | Freq: Two times a day (BID) | ORAL | 0 refills | Status: DC

## 2020-12-04 NOTE — Progress Notes (Signed)
Pt presents today with complaints of left lower pain. Pt states it feels like a sharp shooting pain right above pubic bone. Pt denies any unusual vaginal discharge. Pt states she is having frequency and urgency with urination. Pt denies any burning. Pt states she has constant back pain so she is unable to determine if the lower pelvic pain radiates.

## 2020-12-04 NOTE — Progress Notes (Signed)
   PRENATAL VISIT NOTE  Subjective:  Tammy Mejia is a 30 y.o. G4P1020 at [redacted]w[redacted]d being seen today for ongoing prenatal care.  She is currently monitored for the following issues for this low-risk pregnancy and has Palpitations; Migraine; Anxiety and depression; ASCUS with positive high risk HPV cervical; and Supervision of other normal pregnancy, antepartum on their problem list.  Patient reports pain in her LLQ. She reports freq urination of small amounts. The pain started on yesterday. She denies bleeding or abnormal vaginal discharge. Pt has a h/o depression and she reports that her sx have returned. She is off her meds. She was prev on zoloft with her other pregnancy. She feels that her sx are worse this pregnancy.   Contractions: Not present. Vag. Bleeding: None.  Movement: Present. Denies leaking of fluid.   The following portions of the patient's history were reviewed and updated as appropriate: allergies, current medications, past family history, past medical history, past social history, past surgical history and problem list.   Objective:   Vitals:   12/04/20 1305  BP: (!) 96/50  Pulse: 61  Weight: 116 lb (52.6 kg)    Fetal Status: Fetal Heart Rate (bpm): 152   Movement: Present     General:  Alert, oriented and cooperative. Patient is in no acute distress.  Skin: Skin is warm and dry. No rash noted.   Cardiovascular: Normal heart rate noted  Respiratory: Normal respiratory effort, no problems with respiration noted  Abdomen: Soft, gravid, appropriate for gestational age.  Pain/Pressure: Present  +suprapubic tenderness; no rebound or guarding.   Pelvic: Cervical exam deferred        Extremities: Normal range of motion.  Edema: None  Mental Status: Normal mood and affect. Normal behavior. Normal judgment and thought content.   Assessment and Plan:  Carmilla was seen today for routine prenatal visit.  Diagnoses and all orders for this visit:  Supervision of other normal  pregnancy, antepartum  Depression affecting pregnancy in second trimester, antepartum -     sertraline (ZOLOFT) 50 MG tablet; Take 1 tablet (50 mg total) by mouth daily.  UTI (urinary tract infection) in pregnancy, antepartum -     POCT urinalysis dipstick -     Urine Culture -     nitrofurantoin, macrocrystal-monohydrate, (MACROBID) 100 MG capsule; Take 1 capsule (100 mg total) by mouth 2 (two) times daily.   Preterm labor symptoms and general obstetric precautions including but not limited to vaginal bleeding, contractions, leaking of fluid and fetal movement were reviewed in detail with the patient. Please refer to After Visit Summary for other counseling recommendations.   No follow-ups on file.  Future Appointments  Date Time Provider Department Center  12/08/2020  3:00 PM Sharlene Dory, [place] [facility]  12/12/2020 12:45 PM [facility]  12/18/2020  2:45 PM [place], Rhona Raider, DO [facility] None    Willodean Rosenthal, MD

## 2020-12-06 LAB — URINE CULTURE

## 2020-12-08 ENCOUNTER — Ambulatory Visit: Payer: Managed Care, Other (non HMO) | Admitting: Family Medicine

## 2020-12-12 ENCOUNTER — Other Ambulatory Visit: Payer: Self-pay

## 2020-12-12 ENCOUNTER — Ambulatory Visit: Payer: Managed Care, Other (non HMO) | Attending: Obstetrics and Gynecology

## 2020-12-12 DIAGNOSIS — Z3480 Encounter for supervision of other normal pregnancy, unspecified trimester: Secondary | ICD-10-CM | POA: Insufficient documentation

## 2020-12-18 ENCOUNTER — Ambulatory Visit (INDEPENDENT_AMBULATORY_CARE_PROVIDER_SITE_OTHER): Payer: Managed Care, Other (non HMO) | Admitting: Family Medicine

## 2020-12-18 ENCOUNTER — Other Ambulatory Visit: Payer: Self-pay

## 2020-12-18 VITALS — BP 100/65 | HR 88 | Wt 120.0 lb

## 2020-12-18 DIAGNOSIS — R87610 Atypical squamous cells of undetermined significance on cytologic smear of cervix (ASC-US): Secondary | ICD-10-CM

## 2020-12-18 DIAGNOSIS — R87810 Cervical high risk human papillomavirus (HPV) DNA test positive: Secondary | ICD-10-CM

## 2020-12-18 DIAGNOSIS — Z3480 Encounter for supervision of other normal pregnancy, unspecified trimester: Secondary | ICD-10-CM

## 2020-12-18 NOTE — Progress Notes (Signed)
   PRENATAL VISIT NOTE  Subjective:  Tammy Mejia is a 30 y.o. G4P1020 at [redacted]w[redacted]d being seen today for ongoing prenatal care.  She is currently monitored for the following issues for this low-risk pregnancy and has Palpitations; Migraine; Anxiety and depression; ASCUS with positive high risk HPV cervical; and Supervision of other normal pregnancy, antepartum on their problem list.  Patient reports no complaints.  Contractions: Not present. Vag. Bleeding: None.  Movement: Present. Denies leaking of fluid.   The following portions of the patient's history were reviewed and updated as appropriate: allergies, current medications, past family history, past medical history, past social history, past surgical history and problem list.   Objective:   Vitals:   12/18/20 1453  BP: 100/65  Pulse: 88  Weight: 120 lb (54.4 kg)    Fetal Status:   Fundal Height: 20 cm Movement: Present     General:  Alert, oriented and cooperative. Patient is in no acute distress.  Skin: Skin is warm and dry. No rash noted.   Cardiovascular: Normal heart rate noted  Respiratory: Normal respiratory effort, no problems with respiration noted  Abdomen: Soft, gravid, appropriate for gestational age.  Pain/Pressure: Present     Pelvic: Cervical exam deferred        Extremities: Normal range of motion.  Edema: None  Mental Status: Normal mood and affect. Normal behavior. Normal judgment and thought content.   Assessment and Plan:  Pregnancy: G4P1020 at [redacted]w[redacted]d 1. Supervision of other normal pregnancy, antepartum FHT and FH normal  2. ASCUS with positive high risk HPV cervical ASCUS in 2019, but PAP 2020 normal. Wants to wait until after pregnancy for PAP.    Preterm labor symptoms and general obstetric precautions including but not limited to vaginal bleeding, contractions, leaking of fluid and fetal movement were reviewed in detail with the patient. Please refer to After Visit Summary for other counseling  recommendations.   Return in about 4 weeks (around 01/15/2021) for OB f/u.  No future appointments.  Levie Heritage, DO

## 2021-01-14 ENCOUNTER — Telehealth: Payer: Managed Care, Other (non HMO) | Admitting: Family

## 2021-01-14 ENCOUNTER — Ambulatory Visit (INDEPENDENT_AMBULATORY_CARE_PROVIDER_SITE_OTHER): Payer: Managed Care, Other (non HMO) | Admitting: Family Medicine

## 2021-01-14 ENCOUNTER — Encounter: Payer: Self-pay | Admitting: Family Medicine

## 2021-01-14 ENCOUNTER — Other Ambulatory Visit: Payer: Self-pay

## 2021-01-14 VITALS — BP 122/68 | HR 90 | Temp 97.9°F | Ht 61.0 in | Wt 122.1 lb

## 2021-01-14 DIAGNOSIS — Z3A24 24 weeks gestation of pregnancy: Secondary | ICD-10-CM

## 2021-01-14 DIAGNOSIS — M545 Low back pain, unspecified: Secondary | ICD-10-CM | POA: Diagnosis not present

## 2021-01-14 DIAGNOSIS — Z3490 Encounter for supervision of normal pregnancy, unspecified, unspecified trimester: Secondary | ICD-10-CM

## 2021-01-14 MED ORDER — NAPROXEN 500 MG PO TABS: 500.0000 mg | ORAL_TABLET | Freq: Two times a day (BID) | ORAL | 0 refills | Status: DC

## 2021-01-14 MED ORDER — BACLOFEN 10 MG PO TABS: 10.0000 mg | ORAL_TABLET | Freq: Three times a day (TID) | ORAL | 0 refills | Status: DC

## 2021-01-14 NOTE — Patient Instructions (Signed)
Get your husband to massage your back.   Have HR fax [place] your FMLA form.   OK to take Tylenol 1000 mg (2 extra strength tabs) or 975 mg (3 regular strength tabs) every 6 hours as needed.  Ice/cold pack over area for 10-15 min twice daily.  Heat (pad or rice pillow in microwave) over affected area, 10-15 minutes twice daily.   Let us know if you need anything.  EXERCISES  RANGE OF MOTION (ROM) AND STRETCHING EXERCISES - Low Back Pain Most people with lower back pain will find that their symptoms get worse with excessive bending forward (flexion) or arching at the lower back (extension). The exercises that will help resolve your symptoms will focus on the opposite motion.  If you have pain, numbness or tingling which travels down into your buttocks, leg or foot, the goal of the therapy is for these symptoms to move closer to your back and eventually resolve. Sometimes, these leg symptoms will get better, but your lower back pain may worsen. This is often an indication of progress in your rehabilitation. Be very alert to any changes in your symptoms and the activities in which you participated in the 24 hours prior to the change. Sharing this information with your caregiver will allow him or her to most efficiently treat your condition. These exercises may help you when beginning to rehabilitate your injury. Your symptoms may resolve with or without further involvement from your physician, physical therapist or athletic trainer. While completing these exercises, remember:   Restoring tissue flexibility helps normal motion to return to the joints. This allows healthier, less painful movement and activity.  An effective stretch should be held for at least 30 seconds.  A stretch should never be painful. You should only feel a gentle lengthening or release in the stretched tissue. FLEXION RANGE OF MOTION AND STRETCHING EXERCISES:  STRETCH - Flexion, Single Knee to Chest   Lie on a firm bed or  floor with both legs extended in front of you.  Keeping one leg in contact with the floor, bring your opposite knee to your chest. Hold your leg in place by either grabbing behind your thigh or at your knee.  Pull until you feel a gentle stretch in your low back. Hold 30 seconds.  Slowly release your grasp and repeat the exercise with the opposite side. Repeat 2 times. Complete this exercise 3 times per week.   STRETCH - Flexion, Double Knee to Chest  Lie on a firm bed or floor with both legs extended in front of you.  Keeping one leg in contact with the floor, bring your opposite knee to your chest.  Tense your stomach muscles to support your back and then lift your other knee to your chest. Hold your legs in place by either grabbing behind your thighs or at your knees.  Pull both knees toward your chest until you feel a gentle stretch in your low back. Hold 30 seconds.  Tense your stomach muscles and slowly return one leg at a time to the floor. Repeat 2 times. Complete this exercise 3 times per week.   STRETCH - Low Trunk Rotation  Lie on a firm bed or floor. Keeping your legs in front of you, bend your knees so they are both pointed toward the ceiling and your feet are flat on the floor.  Extend your arms out to the side. This will stabilize your upper body by keeping your shoulders in contact with the floor.  Gently and slowly drop both knees together to one side until you feel a gentle stretch in your low back. Hold for 30 seconds.  Tense your stomach muscles to support your lower back as you bring your knees back to the starting position. Repeat the exercise to the other side. Repeat 2 times. Complete this exercise at least 3 times per week.   EXTENSION RANGE OF MOTION AND FLEXIBILITY EXERCISES:  STRETCH - Extension, Prone on Elbows   Lie on your stomach on the floor, a bed will be too soft. Place your palms about shoulder width apart and at the height of your  head.  Place your elbows under your shoulders. If this is too painful, stack pillows under your chest.  Allow your body to relax so that your hips drop lower and make contact more completely with the floor.  Hold this position for 30 seconds.  Slowly return to lying flat on the floor. Repeat 2 times. Complete this exercise 3 times per week.   RANGE OF MOTION - Extension, Prone Press Ups  Lie on your stomach on the floor, a bed will be too soft. Place your palms about shoulder width apart and at the height of your head.  Keeping your back as relaxed as possible, slowly straighten your elbows while keeping your hips on the floor. You may adjust the placement of your hands to maximize your comfort. As you gain motion, your hands will come more underneath your shoulders.  Hold this position 30 seconds.  Slowly return to lying flat on the floor. Repeat 2 times. Complete this exercise 3 times per week.   RANGE OF MOTION- Quadruped, Neutral Spine   Assume a hands and knees position on a firm surface. Keep your hands under your shoulders and your knees under your hips. You may place padding under your knees for comfort.  Drop your head and point your tailbone toward the ground below you. This will round out your lower back like an angry cat. Hold this position for 30 seconds.  Slowly lift your head and release your tail bone so that your back sags into a large arch, like an old horse.  Hold this position for 30 seconds.  Repeat this until you feel limber in your low back.  Now, find your "sweet spot." This will be the most comfortable position somewhere between the two previous positions. This is your neutral spine. Once you have found this position, tense your stomach muscles to support your low back.  Hold this position for 30 seconds. Repeat 2 times. Complete this exercise 3 times per week.   STRENGTHENING EXERCISES - Low Back Sprain These exercises may help you when beginning to  rehabilitate your injury. These exercises should be done near your "sweet spot." This is the neutral, low-back arch, somewhere between fully rounded and fully arched, that is your least painful position. When performed in this safe range of motion, these exercises can be used for people who have either a flexion or extension based injury. These exercises may resolve your symptoms with or without further involvement from your physician, physical therapist or athletic trainer. While completing these exercises, remember:   Muscles can gain both the endurance and the strength needed for everyday activities through controlled exercises.  Complete these exercises as instructed by your physician, physical therapist or athletic trainer. Increase the resistance and repetitions only as guided.  You may experience muscle soreness or fatigue, but the pain or discomfort you are trying to eliminate  should never worsen during these exercises. If this pain does worsen, stop and make certain you are following the directions exactly. If the pain is still present after adjustments, discontinue the exercise until you can discuss the trouble with your caregiver.  STRENGTHENING - Deep Abdominals, Pelvic Tilt   Lie on a firm bed or floor. Keeping your legs in front of you, bend your knees so they are both pointed toward the ceiling and your feet are flat on the floor.  Tense your lower abdominal muscles to press your low back into the floor. This motion will rotate your pelvis so that your tail bone is scooping upwards rather than pointing at your feet or into the floor. With a gentle tension and even breathing, hold this position for 3 seconds. Repeat 2 times. Complete this exercise 3 times per week.   STRENGTHENING - Abdominals, Crunches   Lie on a firm bed or floor. Keeping your legs in front of you, bend your knees so they are both pointed toward the ceiling and your feet are flat on the floor. Cross your arms over  your chest.  Slightly tip your chin down without bending your neck.  Tense your abdominals and slowly lift your trunk high enough to just clear your shoulder blades. Lifting higher can put excessive stress on the lower back and does not further strengthen your abdominal muscles.  Control your return to the starting position. Repeat 2 times. Complete this exercise 3 times per week.   STRENGTHENING - Quadruped, Opposite UE/LE Lift   Assume a hands and knees position on a firm surface. Keep your hands under your shoulders and your knees under your hips. You may place padding under your knees for comfort.  Find your neutral spine and gently tense your abdominal muscles so that you can maintain this position. Your shoulders and hips should form a rectangle that is parallel with the floor and is not twisted.  Keeping your trunk steady, lift your right hand no higher than your shoulder and then your left leg no higher than your hip. Make sure you are not holding your breath. Hold this position for 30 seconds.  Continuing to keep your abdominal muscles tense and your back steady, slowly return to your starting position. Repeat with the opposite arm and leg. Repeat 2 times. Complete this exercise 3 times per week.   STRENGTHENING - Abdominals and Quadriceps, Straight Leg Raise   Lie on a firm bed or floor with both legs extended in front of you.  Keeping one leg in contact with the floor, bend the other knee so that your foot can rest flat on the floor.  Find your neutral spine, and tense your abdominal muscles to maintain your spinal position throughout the exercise.  Slowly lift your straight leg off the floor about 6 inches for a count of 3, making sure to not hold your breath.  Still keeping your neutral spine, slowly lower your leg all the way to the floor. Repeat this exercise with each leg 2 times. Complete this exercise 3 times per week.  POSTURE AND BODY MECHANICS CONSIDERATIONS -  Low Back Sprain Keeping correct posture when sitting, standing or completing your activities will reduce the stress put on different body tissues, allowing injured tissues a chance to heal and limiting painful experiences. The following are general guidelines for improved posture.  While reading these guidelines, remember:  The exercises prescribed by your provider will help you have the flexibility and strength to maintain  correct postures.  The correct posture provides the best environment for your joints to work. All of your joints have less wear and tear when properly supported by a spine with good posture. This means you will experience a healthier, less painful body.  Correct posture must be practiced with all of your activities, especially prolonged sitting and standing. Correct posture is as important when doing repetitive low-stress activities (typing) as it is when doing a single heavy-load activity (lifting).  RESTING POSITIONS Consider which positions are most painful for you when choosing a resting position. If you have pain with flexion-based activities (sitting, bending, stooping, squatting), choose a position that allows you to rest in a less flexed posture. You would want to avoid curling into a fetal position on your side. If your pain worsens with extension-based activities (prolonged standing, working overhead), avoid resting in an extended position such as sleeping on your stomach. Most people will find more comfort when they rest with their spine in a more neutral position, neither too rounded nor too arched. Lying on a non-sagging bed on your side with a pillow between your knees, or on your back with a pillow under your knees will often provide some relief. Keep in mind, being in any one position for a prolonged period of time, no matter how correct your posture, can still lead to stiffness.  PROPER SITTING POSTURE In order to minimize stress and discomfort on your spine, you  must sit with correct posture. Sitting with good posture should be effortless for a healthy body. Returning to good posture is a gradual process. Many people can work toward this most comfortably by using various supports until they have the flexibility and strength to maintain this posture on their own. When sitting with proper posture, your ears will fall over your shoulders and your shoulders will fall over your hips. You should use the back of the chair to support your upper back. Your lower back will be in a neutral position, just slightly arched. You may place a small pillow or folded towel at the base of your lower back for  support.  When working at a desk, create an environment that supports good, upright posture. Without extra support, muscles tire, which leads to excessive strain on joints and other tissues. Keep these recommendations in mind:  CHAIR:  A chair should be able to slide under your desk when your back makes contact with the back of the chair. This allows you to work closely.  The chair's height should allow your eyes to be level with the upper part of your monitor and your hands to be slightly lower than your elbows.  BODY POSITION  Your feet should make contact with the floor. If this is not possible, use a foot rest.  Keep your ears over your shoulders. This will reduce stress on your neck and low back.  INCORRECT SITTING POSTURES  If you are feeling tired and unable to assume a healthy sitting posture, do not slouch or slump. This puts excessive strain on your back tissues, causing more damage and pain. Healthier options include:  Using more support, like a lumbar pillow.  Switching tasks to something that requires you to be upright or walking.  Talking a brief walk.  Lying down to rest in a neutral-spine position.  PROLONGED STANDING WHILE SLIGHTLY LEANING FORWARD  When completing a task that requires you to lean forward while standing in one place for a long  time, place either foot up on   a stationary 2-4 inch high object to help maintain the best posture. When both feet are on the ground, the lower back tends to lose its slight inward curve. If this curve flattens (or becomes too large), then the back and your other joints will experience too much stress, tire more quickly, and can cause pain.  CORRECT STANDING POSTURES Proper standing posture should be assumed with all daily activities, even if they only take a few moments, like when brushing your teeth. As in sitting, your ears should fall over your shoulders and your shoulders should fall over your hips. You should keep a slight tension in your abdominal muscles to brace your spine. Your tailbone should point down to the ground, not behind your body, resulting in an over-extended swayback posture.   INCORRECT STANDING POSTURES  Common incorrect standing postures include a forward head, locked knees and/or an excessive swayback. WALKING Walk with an upright posture. Your ears, shoulders and hips should all line-up.  PROLONGED ACTIVITY IN A FLEXED POSITION When completing a task that requires you to bend forward at your waist or lean over a low surface, try to find a way to stabilize 3 out of 4 of your limbs. You can place a hand or elbow on your thigh or rest a knee on the surface you are reaching across. This will provide you more stability, so that your muscles do not tire as quickly. By keeping your knees relaxed, or slightly bent, you will also reduce stress across your lower back. CORRECT LIFTING TECHNIQUES  DO :  Assume a wide stance. This will provide you more stability and the opportunity to get as close as possible to the object which you are lifting.  Tense your abdominals to brace your spine. Bend at the knees and hips. Keeping your back locked in a neutral-spine position, lift using your leg muscles. Lift with your legs, keeping your back straight.  Test the weight of unknown objects  before attempting to lift them.  Try to keep your elbows locked down at your sides in order get the best strength from your shoulders when carrying an object.     Always ask for help when lifting heavy or awkward objects. INCORRECT LIFTING TECHNIQUES DO NOT:   Lock your knees when lifting, even if it is a small object.  Bend and twist. Pivot at your feet or move your feet when needing to change directions.  Assume that you can safely pick up even a paperclip without proper posture.   

## 2021-01-14 NOTE — Progress Notes (Signed)
Musculoskeletal Exam  Patient: Tammy Mejia DOB: Jun 15, 1991  DOS: 01/14/2021  SUBJECTIVE:  Chief Complaint:   Chief Complaint  Patient presents with  . Back Pain    Tammy Mejia is a 30 y.o.  female for evaluation and treatment of back pain.   Onset:  2 days ago.  Picked something up while at work and heard a pop.  She is [redacted] weeks pregnant and has been having back pain off and on throughout.  Location: lower R Character:  dull and sharp alternating  Progression of issue:  is unchanged Associated symptoms: no bruising, redness, swelling Denies bowel/bladder incontinence or weakness Treatment: to date has been none.   Neurovascular symptoms: no  Past Medical History:  Diagnosis Date  . Anemia   . Anxiety   . 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 Back pain   . Deficiency of potassium   . Depression   . HPV in female   . Hypokalemia   . Migraine   . PID (pelvic inflammatory disease)     Objective:  VITAL SIGNS: BP 122/68 (BP Location: Left Arm, Patient Position: Sitting, Cuff Size: Normal)   Pulse 90   Temp 97.9 F (36.6 C) (Oral)   Ht 5\' 1"  (1.549 m)   Wt 122 lb 2 oz (55.4 kg)   LMP 07/27/2020   SpO2 99%   BMI 23.08 kg/m  Constitutional: Well formed, well developed. No acute distress. HENT: Normocephalic, atraumatic.  Thorax & Lungs:  No accessory muscle use Musculoskeletal: low back.   Tenderness to palpation: Yes over R lumbar spin at L2-4 Deformity: no Ecchymosis: no Straight leg test: negative for Poor hamstring flexibility on R, decent on L. Neurologic: Normal sensory function. No focal deficits noted. DTR's equal and symmetric in LE's. No clonus. Psychiatric: Normal mood. Age appropriate judgment and insight. Alert & oriented x 3.    Assessment:  Acute right-sided low back pain without sciatica  [redacted] weeks gestation of pregnancy  Plan: Stretches/exercises, heat, ice, Tylenol, avoid NSAIDs. Will fill out FMLA while she  finishes her pregnancy. Will allow for 1 episode per week last 2 days per episode.  F/u prn. The patient voiced understanding and agreement to the plan.   13/03/2020 Tye, DO 01/14/21  1:32 PM

## 2021-01-14 NOTE — Progress Notes (Signed)
Based on what you shared with me, I feel your condition warrants further evaluation and I recommend that you be seen in a face to face office visit.    Given you are pregnant and having back pain you need to be seen face to face. I also see you were treated for a UTI and needs this rechecked as this could be causing some of your pain.    NOTE: If you entered your credit card information for this eVisit, you will not be charged. You may see a "hold" on your card for the $35 but that hold will drop off and you will not have a charge processed.   If you are having a true medical emergency please call 911.      For an urgent face to face visit, [facility] has six urgent care centers for your convenience:     [facility] at [facility] Directions [phone] [address] [place], [place] [address] . 8 am - 4 pm Monday - Friday    [facility]) Get Driving Directions [phone] [address] [place], [place] [address] . 8 am to 8 pm Monday-Friday . 10 am to 6 pm [facility] Urgent [facility]) Get Driving Directions [phone]  [address] [place],  [place]  [address] . 8 am to 8 pm Monday-Friday . 8 am to 4 pm [facility] Urgent Care at [facility] Get Driving Directions [phone] [address] [place], [place] [address] . 8 am to 8 pm Monday-Friday . 8 am to 4 pm [facility] Urgent Care at [facility] Get Driving Directions  [phone] [address] [place], [place] [address] . 8 am to 8 pm Monday-Friday . 8 am to 4 pm [facility] Urgent Care at [facility] Directions [phone] [address], [address] [place], [place] [address] . 8 am to 8 pm Monday-Friday . 8 am to 4 pm Saturday-Sunday     Your MyChart E-visit  questionnaire answers were reviewed by a board certified advanced clinical practitioner to complete your personal care plan based on your specific symptoms.  Thank you for using e-Visits.

## 2021-01-15 ENCOUNTER — Other Ambulatory Visit (HOSPITAL_BASED_OUTPATIENT_CLINIC_OR_DEPARTMENT_OTHER): Payer: Self-pay

## 2021-01-15 ENCOUNTER — Encounter: Payer: Self-pay | Admitting: Family Medicine

## 2021-01-15 ENCOUNTER — Ambulatory Visit (INDEPENDENT_AMBULATORY_CARE_PROVIDER_SITE_OTHER): Payer: Managed Care, Other (non HMO) | Admitting: Family Medicine

## 2021-01-15 VITALS — BP 98/69 | HR 83 | Wt 121.0 lb

## 2021-01-15 DIAGNOSIS — R87810 Cervical high risk human papillomavirus (HPV) DNA test positive: Secondary | ICD-10-CM

## 2021-01-15 DIAGNOSIS — R87610 Atypical squamous cells of undetermined significance on cytologic smear of cervix (ASC-US): Secondary | ICD-10-CM

## 2021-01-15 DIAGNOSIS — R5383 Other fatigue: Secondary | ICD-10-CM

## 2021-01-15 DIAGNOSIS — Z3480 Encounter for supervision of other normal pregnancy, unspecified trimester: Secondary | ICD-10-CM

## 2021-01-15 DIAGNOSIS — Z3A23 23 weeks gestation of pregnancy: Secondary | ICD-10-CM

## 2021-01-15 MED ORDER — CYCLOBENZAPRINE HCL 10 MG PO TABS
10.0000 mg | ORAL_TABLET | Freq: Three times a day (TID) | ORAL | 2 refills | Status: DC | PRN
  Filled 2021-01-15: qty 30, 10d supply, fill #0
  Filled 2021-02-20: qty 30, 10d supply, fill #1

## 2021-01-15 NOTE — Progress Notes (Signed)
   PRENATAL VISIT NOTE  Subjective:  Tammy Mejia is a 30 y.o. G4P1020 at [redacted]w[redacted]d being seen today for ongoing prenatal care.  She is currently monitored for the following issues for this low-risk pregnancy and has Palpitations; Migraine; Anxiety and depression; ASCUS with positive high risk HPV cervical; and Supervision of other normal pregnancy, antepartum on their problem list.  Patient reports fatigue, difficulty sleeping due to restless legs.   Contractions: Not present. Vag. Bleeding: None.  Movement: Present. Denies leaking of fluid.   The following portions of the patient's history were reviewed and updated as appropriate: allergies, current medications, past family history, past medical history, past social history, past surgical history and problem list.   Objective:   Vitals:   01/15/21 1529  BP: 98/69  Pulse: 83  Weight: 121 lb (54.9 kg)    Fetal Status: Fetal Heart Rate (bpm): 140   Movement: Present     General:  Alert, oriented and cooperative. Patient is in no acute distress.  Skin: Skin is warm and dry. No rash noted.   Cardiovascular: Normal heart rate noted  Respiratory: Normal respiratory effort, no problems with respiration noted  Abdomen: Soft, gravid, appropriate for gestational age.  Pain/Pressure: Absent     Pelvic: Cervical exam deferred        Extremities: Normal range of motion.  Edema: None  Mental Status: Normal mood and affect. Normal behavior. Normal judgment and thought content.   Assessment and Plan:  Pregnancy: G4P1020 at [redacted]w[redacted]d 1. [redacted] weeks gestation of pregnancy  2. Supervision of other normal pregnancy, antepartum FHT and FH normal  3. ASCUS with positive high risk HPV cervical ASCUS in 2019, but PAP 2020 normal. Wants to wait until after pregnancy for PAP.    4. Fatigue, unspecified type Check CBC today.  - CBC   Preterm labor symptoms and general obstetric precautions including but not limited to vaginal bleeding, contractions,  leaking of fluid and fetal movement were reviewed in detail with the patient. Please refer to After Visit Summary for other counseling recommendations.   No follow-ups on file.  Future Appointments  Date Time Provider Department Center  02/11/2021 10:30 AM Venora Maples, MD [facility] None    Levie Heritage, DO

## 2021-01-16 ENCOUNTER — Other Ambulatory Visit (HOSPITAL_BASED_OUTPATIENT_CLINIC_OR_DEPARTMENT_OTHER): Payer: Self-pay

## 2021-01-16 LAB — CBC
Hematocrit: 31.8 % — ABNORMAL LOW (ref 34.0–46.6)
Hemoglobin: 10.4 g/dL — ABNORMAL LOW (ref 11.1–15.9)
MCH: 27.9 pg (ref 26.6–33.0)
MCHC: 32.7 g/dL (ref 31.5–35.7)
MCV: 85 fL (ref 79–97)
Platelets: 342 10*3/uL (ref 150–450)
RBC: 3.73 x10E6/uL — ABNORMAL LOW (ref 3.77–5.28)
RDW: 11.4 % — ABNORMAL LOW (ref 11.7–15.4)
WBC: 12.2 10*3/uL — ABNORMAL HIGH (ref 3.4–10.8)

## 2021-01-16 MED ORDER — FERROUS GLUCONATE 240 (27 FE) MG PO TABS
1.0000 | ORAL_TABLET | ORAL | 3 refills | Status: DC
  Filled 2021-01-16: qty 90, 180d supply, fill #0
  Filled 2021-01-19: qty 100, 200d supply, fill #0

## 2021-01-16 NOTE — Addendum Note (Signed)
Addended by: Levie Heritage on: 01/16/2021 08:23 AM   Modules accepted: Orders

## 2021-01-19 ENCOUNTER — Other Ambulatory Visit (HOSPITAL_BASED_OUTPATIENT_CLINIC_OR_DEPARTMENT_OTHER): Payer: Self-pay

## 2021-01-20 ENCOUNTER — Other Ambulatory Visit (HOSPITAL_BASED_OUTPATIENT_CLINIC_OR_DEPARTMENT_OTHER): Payer: Self-pay

## 2021-02-11 ENCOUNTER — Ambulatory Visit (INDEPENDENT_AMBULATORY_CARE_PROVIDER_SITE_OTHER): Payer: Managed Care, Other (non HMO) | Admitting: Family Medicine

## 2021-02-11 ENCOUNTER — Encounter: Payer: Self-pay | Admitting: Family Medicine

## 2021-02-11 ENCOUNTER — Other Ambulatory Visit: Payer: Self-pay

## 2021-02-11 VITALS — BP 102/62 | HR 59 | Wt 124.0 lb

## 2021-02-11 DIAGNOSIS — Z3480 Encounter for supervision of other normal pregnancy, unspecified trimester: Secondary | ICD-10-CM

## 2021-02-11 DIAGNOSIS — Z23 Encounter for immunization: Secondary | ICD-10-CM | POA: Diagnosis not present

## 2021-02-11 DIAGNOSIS — Z3A27 27 weeks gestation of pregnancy: Secondary | ICD-10-CM | POA: Diagnosis not present

## 2021-02-11 DIAGNOSIS — R87810 Cervical high risk human papillomavirus (HPV) DNA test positive: Secondary | ICD-10-CM

## 2021-02-11 DIAGNOSIS — F419 Anxiety disorder, unspecified: Secondary | ICD-10-CM

## 2021-02-11 DIAGNOSIS — R87610 Atypical squamous cells of undetermined significance on cytologic smear of cervix (ASC-US): Secondary | ICD-10-CM

## 2021-02-11 DIAGNOSIS — F32A Depression, unspecified: Secondary | ICD-10-CM

## 2021-02-11 NOTE — Progress Notes (Signed)
+   Fetal movement. No complaints.  

## 2021-02-11 NOTE — Progress Notes (Signed)
   Subjective:  Tammy Mejia is a 30 y.o. [phone] at [redacted]w[redacted]d being seen today for ongoing prenatal care.  She is currently monitored for the following issues for this low-risk pregnancy and has Palpitations; Migraine; Anxiety and depression; ASCUS with positive high risk HPV cervical; and Supervision of other normal pregnancy, antepartum on their problem list.  Patient reports no complaints.  Contractions: Not present. Vag. Bleeding: None.  Movement: Present. Denies leaking of fluid.   The following portions of the patient's history were reviewed and updated as appropriate: allergies, current medications, past family history, past medical history, past social history, past surgical history and problem list. Problem list updated.  Objective:   Vitals:   02/11/21 1059  BP: 102/62  Pulse: (!) 59  Weight: 124 lb (56.2 kg)    Fetal Status: Fetal Heart Rate (bpm): 147   Movement: Present     General:  Alert, oriented and cooperative. Patient is in no acute distress.  Skin: Skin is warm and dry. No rash noted.   Cardiovascular: Normal heart rate noted  Respiratory: Normal respiratory effort, no problems with respiration noted  Abdomen: Soft, gravid, appropriate for gestational age. Pain/Pressure: Present     Pelvic: Vag. Bleeding: None     Cervical exam deferred        Extremities: Normal range of motion.  Edema: None  Mental Status: Normal mood and affect. Normal behavior. Normal judgment and thought content.   Urinalysis:      Assessment and Plan:  Pregnancy: [id number] at [redacted]w[redacted]d  1. [redacted] weeks gestation of pregnancy - RPR - CBC - Glucose Tolerance, 2 Hours w/1 Hour - HIV Antibody (routine testing w rflx) - Tdap vaccine greater than or equal to 7yo IM  2. Supervision of other normal pregnancy, antepartum BP and FHR normal 28wk labs and TDaP today Discussed contraception, she is still considering  3. ASCUS with positive high risk HPV cervical Pap pp per patient preference   4.  Anxiety and depression Mood stable  Preterm labor symptoms and general obstetric precautions including but not limited to vaginal bleeding, contractions, leaking of fluid and fetal movement were reviewed in detail with the patient. Please refer to After Visit Summary for other counseling recommendations.  Return in 2 weeks (on 02/25/2021) for ob visit.   Venora Maples, MD

## 2021-02-11 NOTE — Patient Instructions (Signed)
 Contraception Choices Contraception, also called birth control, refers to methods or devices that prevent pregnancy. Hormonal methods Contraceptive implant A contraceptive implant is a thin, plastic tube that contains a hormone that prevents pregnancy. It is different from an intrauterine device (IUD). It is inserted into the upper part of the arm by a health care provider. Implants can be effective for up to 3 years. Progestin-only injections Progestin-only injections are injections of progestin, a synthetic form of the hormone progesterone. They are given every 3 months by a health care provider. Birth control pills Birth control pills are pills that contain hormones that prevent pregnancy. They must be taken once a day, preferably at the same time each day. A prescription is needed to use this method of contraception. Birth control patch The birth control patch contains hormones that prevent pregnancy. It is placed on the skin and must be changed once a week for three weeks and removed on the fourth week. A prescription is needed to use this method of contraception. Vaginal ring A vaginal ring contains hormones that prevent pregnancy. It is placed in the vagina for three weeks and removed on the fourth week. After that, the process is repeated with a new ring. A prescription is needed to use this method of contraception. Emergency contraceptive Emergency contraceptives prevent pregnancy after unprotected sex. They come in pill form and can be taken up to 5 days after sex. They work best the sooner they are taken after having sex. Most emergency contraceptives are available without a prescription. This method should not be used as your only form of birth control.   Barrier methods Female condom A female condom is a thin sheath that is worn over the penis during sex. Condoms keep sperm from going inside a woman's body. They can be used with a sperm-killing substance (spermicide) to increase their  effectiveness. They should be thrown away after one use. Female condom A female condom is a soft, loose-fitting sheath that is put into the vagina before sex. The condom keeps sperm from going inside a woman's body. They should be thrown away after one use. Diaphragm A diaphragm is a soft, dome-shaped barrier. It is inserted into the vagina before sex, along with a spermicide. The diaphragm blocks sperm from entering the uterus, and the spermicide kills sperm. A diaphragm should be left in the vagina for 6-8 hours after sex and removed within 24 hours. A diaphragm is prescribed and fitted by a health care provider. A diaphragm should be replaced every 1-2 years, after giving birth, after gaining more than 15 lb (6.8 kg), and after pelvic surgery. Cervical cap A cervical cap is a round, soft latex or plastic cup that fits over the cervix. It is inserted into the vagina before sex, along with spermicide. It blocks sperm from entering the uterus. The cap should be left in place for 6-8 hours after sex and removed within 48 hours. A cervical cap must be prescribed and fitted by a health care provider. It should be replaced every 2 years. Sponge A sponge is a soft, circular piece of polyurethane foam with spermicide in it. The sponge helps block sperm from entering the uterus, and the spermicide kills sperm. To use it, you make it wet and then insert it into the vagina. It should be inserted before sex, left in for at least 6 hours after sex, and removed and thrown away within 30 hours. Spermicides Spermicides are chemicals that kill or block sperm from entering the   cervix and uterus. They can come as a cream, jelly, suppository, foam, or tablet. A spermicide should be inserted into the vagina with an applicator at least 10-15 minutes before sex to allow time for it to work. The process must be repeated every time you have sex. Spermicides do not require a prescription.   Intrauterine  contraception Intrauterine device (IUD) An IUD is a T-shaped device that is put in a woman's uterus. There are two types:  Hormone IUD.This type contains progestin, a synthetic form of the hormone progesterone. This type can stay in place for 3-5 years.  Copper IUD.This type is wrapped in copper wire. It can stay in place for 10 years. Permanent methods of contraception Female tubal ligation In this method, a woman's fallopian tubes are sealed, tied, or blocked during surgery to prevent eggs from traveling to the uterus. Hysteroscopic sterilization In this method, a small, flexible insert is placed into each fallopian tube. The inserts cause scar tissue to form in the fallopian tubes and block them, so sperm cannot reach an egg. The procedure takes about 3 months to be effective. Another form of birth control must be used during those 3 months. Female sterilization This is a procedure to tie off the tubes that carry sperm (vasectomy). After the procedure, the man can still ejaculate fluid (semen). Another form of birth control must be used for 3 months after the procedure. Natural planning methods Natural family planning In this method, a couple does not have sex on days when the woman could become pregnant. Calendar method In this method, the woman keeps track of the length of each menstrual cycle, identifies the days when pregnancy can happen, and does not have sex on those days. Ovulation method In this method, a couple avoids sex during ovulation. Symptothermal method This method involves not having sex during ovulation. The woman typically checks for ovulation by watching changes in her temperature and in the consistency of cervical mucus. Post-ovulation method In this method, a couple waits to have sex until after ovulation. Where to find more information  Centers for Disease Control and Prevention: www.cdc.gov Summary  Contraception, also called birth control, refers to methods or  devices that prevent pregnancy.  Hormonal methods of contraception include implants, injections, pills, patches, vaginal rings, and emergency contraceptives.  Barrier methods of contraception can include female condoms, female condoms, diaphragms, cervical caps, sponges, and spermicides.  There are two types of IUDs (intrauterine devices). An IUD can be put in a woman's uterus to prevent pregnancy for 3-5 years.  Permanent sterilization can be done through a procedure for males and females. Natural family planning methods involve nothaving sex on days when the woman could become pregnant. This information is not intended to replace advice given to you by your health care provider. Make sure you discuss any questions you have with your health care provider. Document Revised: 02/11/2020 Document Reviewed: 02/11/2020 Elsevier Patient Education  2021 [employer]   Breastfeeding  Choosing to breastfeed is one of the best decisions you can make for yourself and your baby. A change in hormones during pregnancy causes your breasts to make breast milk in your milk-producing glands. Hormones prevent breast milk from being released before your baby is born. They also prompt milk flow after birth. Once breastfeeding has begun, thoughts of your baby, as well as his or her sucking or crying, can stimulate the release of milk from your milk-producing glands. Benefits of breastfeeding Research shows that breastfeeding offers many health benefits   for infants and mothers. It also offers a cost-free and convenient way to feed your baby. For your baby  Your first milk (colostrum) helps your baby's digestive system to function better.  Special cells in your milk (antibodies) help your baby to fight off infections.  Breastfed babies are less likely to develop asthma, allergies, obesity, or type 2 diabetes. They are also at lower risk for sudden infant death syndrome (SIDS).  Nutrients in breast milk are better  able to meet your baby's needs compared to infant formula.  Breast milk improves your baby's brain development. For you  Breastfeeding helps to create a very special bond between you and your baby.  Breastfeeding is convenient. Breast milk costs nothing and is always available at the correct temperature.  Breastfeeding helps to burn calories. It helps you to lose the weight that you gained during pregnancy.  Breastfeeding makes your uterus return faster to its size before pregnancy. It also slows bleeding (lochia) after you give birth.  Breastfeeding helps to lower your risk of developing type 2 diabetes, osteoporosis, rheumatoid arthritis, cardiovascular disease, and breast, ovarian, uterine, and endometrial cancer later in life. Breastfeeding basics Starting breastfeeding  Find a comfortable place to sit or lie down, with your neck and back well-supported.  Place a pillow or a rolled-up blanket under your baby to bring him or her to the level of your breast (if you are seated). Nursing pillows are specially designed to help support your arms and your baby while you breastfeed.  Make sure that your baby's tummy (abdomen) is facing your abdomen.  Gently massage your breast. With your fingertips, massage from the outer edges of your breast inward toward the nipple. This encourages milk flow. If your milk flows slowly, you may need to continue this action during the feeding.  Support your breast with 4 fingers underneath and your thumb above your nipple (make the letter "C" with your hand). Make sure your fingers are well away from your nipple and your baby's mouth.  Stroke your baby's lips gently with your finger or nipple.  When your baby's mouth is open wide enough, quickly bring your baby to your breast, placing your entire nipple and as much of the areola as possible into your baby's mouth. The areola is the colored area around your nipple. ? More areola should be visible above your  baby's upper lip than below the lower lip. ? Your baby's lips should be opened and extended outward (flanged) to ensure an adequate, comfortable latch. ? Your baby's tongue should be between his or her lower gum and your breast.  Make sure that your baby's mouth is correctly positioned around your nipple (latched). Your baby's lips should create a seal on your breast and be turned out (everted).  It is common for your baby to suck about 2-3 minutes in order to start the flow of breast milk. Latching Teaching your baby how to latch onto your breast properly is very important. An improper latch can cause nipple pain, decreased milk supply, and poor weight gain in your baby. Also, if your baby is not latched onto your nipple properly, he or she may swallow some air during feeding. This can make your baby fussy. Burping your baby when you switch breasts during the feeding can help to get rid of the air. However, teaching your baby to latch on properly is still the best way to prevent fussiness from swallowing air while breastfeeding. Signs that your baby has successfully latched onto   your nipple  Silent tugging or silent sucking, without causing you pain. Infant's lips should be extended outward (flanged).  Swallowing heard between every 3-4 sucks once your milk has started to flow (after your let-down milk reflex occurs).  Muscle movement above and in front of his or her ears while sucking. Signs that your baby has not successfully latched onto your nipple  Sucking sounds or smacking sounds from your baby while breastfeeding.  Nipple pain. If you think your baby has not latched on correctly, slip your finger into the corner of your baby's mouth to break the suction and place it between your baby's gums. Attempt to start breastfeeding again. Signs of successful breastfeeding Signs from your baby  Your baby will gradually decrease the number of sucks or will completely stop sucking.  Your baby  will fall asleep.  Your baby's body will relax.  Your baby will retain a small amount of milk in his or her mouth.  Your baby will let go of your breast by himself or herself. Signs from you  Breasts that have increased in firmness, weight, and size 1-3 hours after feeding.  Breasts that are softer immediately after breastfeeding.  Increased milk volume, as well as a change in milk consistency and color by the fifth day of breastfeeding.  Nipples that are not sore, cracked, or bleeding. Signs that your baby is getting enough milk  Wetting at least 1-2 diapers during the first 24 hours after birth.  Wetting at least 5-6 diapers every 24 hours for the first week after birth. The urine should be clear or pale yellow by the age of 5 days.  Wetting 6-8 diapers every 24 hours as your baby continues to grow and develop.  At least 3 stools in a 24-hour period by the age of 5 days. The stool should be soft and yellow.  At least 3 stools in a 24-hour period by the age of 7 days. The stool should be seedy and yellow.  No loss of weight greater than 10% of birth weight during the first 3 days of life.  Average weight gain of 4-7 oz (113-198 g) per week after the age of 4 days.  Consistent daily weight gain by the age of 5 days, without weight loss after the age of 2 weeks. After a feeding, your baby may spit up a small amount of milk. This is normal. Breastfeeding frequency and duration Frequent feeding will help you make more milk and can prevent sore nipples and extremely full breasts (breast engorgement). Breastfeed when you feel the need to reduce the fullness of your breasts or when your baby shows signs of hunger. This is called "breastfeeding on demand." Signs that your baby is hungry include:  Increased alertness, activity, or restlessness.  Movement of the head from side to side.  Opening of the mouth when the corner of the mouth or cheek is stroked (rooting).  Increased  sucking sounds, smacking lips, cooing, sighing, or squeaking.  Hand-to-mouth movements and sucking on fingers or hands.  Fussing or crying. Avoid introducing a pacifier to your baby in the first 4-6 weeks after your baby is born. After this time, you may choose to use a pacifier. Research has shown that pacifier use during the first year of a baby's life decreases the risk of sudden infant death syndrome (SIDS). Allow your baby to feed on each breast as long as he or she wants. When your baby unlatches or falls asleep while feeding from the   first breast, offer the second breast. Because newborns are often sleepy in the first few weeks of life, you may need to awaken your baby to get him or her to feed. Breastfeeding times will vary from baby to baby. However, the following rules can serve as a guide to help you make sure that your baby is properly fed:  Newborns (babies 4 weeks of age or younger) may breastfeed every 1-3 hours.  Newborns should not go without breastfeeding for longer than 3 hours during the day or 5 hours during the night.  You should breastfeed your baby a minimum of 8 times in a 24-hour period. Breast milk pumping Pumping and storing breast milk allows you to make sure that your baby is exclusively fed your breast milk, even at times when you are unable to breastfeed. This is especially important if you go back to work while you are still breastfeeding, or if you are not able to be present during feedings. Your lactation consultant can help you find a method of pumping that works best for you and give you guidelines about how long it is safe to store breast milk.      Caring for your breasts while you breastfeed Nipples can become dry, cracked, and sore while breastfeeding. The following recommendations can help keep your breasts moisturized and healthy:  Avoid using soap on your nipples.  Wear a supportive bra designed especially for nursing. Avoid wearing underwire-style  bras or extremely tight bras (sports bras).  Air-dry your nipples for 3-4 minutes after each feeding.  Use only cotton bra pads to absorb leaked breast milk. Leaking of breast milk between feedings is normal.  Use lanolin on your nipples after breastfeeding. Lanolin helps to maintain your skin's normal moisture barrier. Pure lanolin is not harmful (not toxic) to your baby. You may also hand express a few drops of breast milk and gently massage that milk into your nipples and allow the milk to air-dry. In the first few weeks after giving birth, some women experience breast engorgement. Engorgement can make your breasts feel heavy, warm, and tender to the touch. Engorgement peaks within 3-5 days after you give birth. The following recommendations can help to ease engorgement:  Completely empty your breasts while breastfeeding or pumping. You may want to start by applying warm, moist heat (in the shower or with warm, water-soaked hand towels) just before feeding or pumping. This increases circulation and helps the milk flow. If your baby does not completely empty your breasts while breastfeeding, pump any extra milk after he or she is finished.  Apply ice packs to your breasts immediately after breastfeeding or pumping, unless this is too uncomfortable for you. To do this: ? Put ice in a plastic bag. ? Place a towel between your skin and the bag. ? Leave the ice on for 20 minutes, 2-3 times a day.  Make sure that your baby is latched on and positioned properly while breastfeeding. If engorgement persists after 48 hours of following these recommendations, contact your health care provider or a lactation consultant. Overall health care recommendations while breastfeeding  Eat 3 healthy meals and 3 snacks every day. Well-nourished mothers who are breastfeeding need an additional 450-500 calories a day. You can meet this requirement by increasing the amount of a balanced diet that you eat.  Drink  enough water to keep your urine pale yellow or clear.  Rest often, relax, and continue to take your prenatal vitamins to prevent fatigue, stress, and low   vitamin and mineral levels in your body (nutrient deficiencies).  Do not use any products that contain nicotine or tobacco, such as cigarettes and e-cigarettes. Your baby may be harmed by chemicals from cigarettes that pass into breast milk and exposure to secondhand smoke. If you need help quitting, ask your health care provider.  Avoid alcohol.  Do not use illegal drugs or marijuana.  Talk with your health care provider before taking any medicines. These include over-the-counter and prescription medicines as well as vitamins and herbal supplements. Some medicines that may be harmful to your baby can pass through breast milk.  It is possible to become pregnant while breastfeeding. If birth control is desired, ask your health care provider about options that will be safe while breastfeeding your baby. Where to find more information: [employer]: www.llli.org Contact a health care provider if:  You feel like you want to stop breastfeeding or have become frustrated with breastfeeding.  Your nipples are cracked or bleeding.  Your breasts are red, tender, or warm.  You have: ? Painful breasts or nipples. ? A swollen area on either breast. ? A fever or chills. ? Nausea or vomiting. ? Drainage other than breast milk from your nipples.  Your breasts do not become full before feedings by the fifth day after you give birth.  You feel sad and depressed.  Your baby is: ? Too sleepy to eat well. ? Having trouble sleeping. ? More than 1 week old and wetting fewer than 6 diapers in a 24-hour period. ? Not gaining weight by 5 days of age.  Your baby has fewer than 3 stools in a 24-hour period.  Your baby's skin or the white parts of his or her eyes become yellow. Get help right away if:  Your baby is overly tired  (lethargic) and does not want to wake up and feed.  Your baby develops an unexplained fever. Summary  Breastfeeding offers many health benefits for infant and mothers.  Try to breastfeed your infant when he or she shows early signs of hunger.  Gently tickle or stroke your baby's lips with your finger or nipple to allow the baby to open his or her mouth. Bring the baby to your breast. Make sure that much of the areola is in your baby's mouth. Offer one side and burp the baby before you offer the other side.  Talk with your health care provider or lactation consultant if you have questions or you face problems as you breastfeed. This information is not intended to replace advice given to you by your health care provider. Make sure you discuss any questions you have with your health care provider. Document Revised: 12/01/2017 Document Reviewed: 10/08/2016 Elsevier Patient Education  2021 [employer].  

## 2021-02-12 ENCOUNTER — Encounter: Payer: Self-pay | Admitting: Family Medicine

## 2021-02-12 LAB — CBC
Hematocrit: 30.8 % — ABNORMAL LOW (ref 34.0–46.6)
Hemoglobin: 10 g/dL — ABNORMAL LOW (ref 11.1–15.9)
MCH: 26.8 pg (ref 26.6–33.0)
MCHC: 32.5 g/dL (ref 31.5–35.7)
MCV: 83 fL (ref 79–97)
Platelets: 352 10*3/uL (ref 150–450)
RBC: 3.73 x10E6/uL — ABNORMAL LOW (ref 3.77–5.28)
RDW: 11.5 % — ABNORMAL LOW (ref 11.7–15.4)
WBC: 10.8 10*3/uL (ref 3.4–10.8)

## 2021-02-12 LAB — RPR: RPR Ser Ql: NONREACTIVE

## 2021-02-12 LAB — GLUCOSE TOLERANCE, 2 HOURS W/ 1HR
Glucose, 1 hour: 119 mg/dL (ref 65–179)
Glucose, 2 hour: 105 mg/dL (ref 65–152)
Glucose, Fasting: 75 mg/dL (ref 65–91)

## 2021-02-12 LAB — HIV ANTIBODY (ROUTINE TESTING W REFLEX): HIV Screen 4th Generation wRfx: NONREACTIVE

## 2021-02-18 ENCOUNTER — Other Ambulatory Visit (HOSPITAL_BASED_OUTPATIENT_CLINIC_OR_DEPARTMENT_OTHER): Payer: Self-pay

## 2021-02-18 ENCOUNTER — Inpatient Hospital Stay (HOSPITAL_COMMUNITY)
Admission: AD | Admit: 2021-02-18 | Discharge: 2021-02-18 | Disposition: A | Discharge status: Home / Self Care | Payer: Managed Care, Other (non HMO) | Attending: Obstetrics and Gynecology | Admitting: Obstetrics and Gynecology

## 2021-02-18 ENCOUNTER — Encounter (HOSPITAL_COMMUNITY): Payer: Self-pay | Admitting: Obstetrics and Gynecology

## 2021-02-18 ENCOUNTER — Other Ambulatory Visit: Payer: Self-pay

## 2021-02-18 DIAGNOSIS — Z0371 Encounter for suspected problem with amniotic cavity and membrane ruled out: Secondary | ICD-10-CM | POA: Insufficient documentation

## 2021-02-18 DIAGNOSIS — Z79899 Other long term (current) drug therapy: Secondary | ICD-10-CM | POA: Diagnosis not present

## 2021-02-18 DIAGNOSIS — O26893 Other specified pregnancy related conditions, third trimester: Secondary | ICD-10-CM | POA: Diagnosis present

## 2021-02-18 DIAGNOSIS — B379 Candidiasis, unspecified: Secondary | ICD-10-CM | POA: Insufficient documentation

## 2021-02-18 DIAGNOSIS — O23593 Infection of other part of genital tract in pregnancy, third trimester: Secondary | ICD-10-CM

## 2021-02-18 DIAGNOSIS — B373 Candidiasis of vulva and vagina: Secondary | ICD-10-CM

## 2021-02-18 DIAGNOSIS — Z3A28 28 weeks gestation of pregnancy: Secondary | ICD-10-CM | POA: Diagnosis not present

## 2021-02-18 HISTORY — DX: Scoliosis, unspecified: M41.9

## 2021-02-18 LAB — WET PREP, GENITAL
Clue Cells Wet Prep HPF POC: NONE SEEN
Sperm: NONE SEEN
Trich, Wet Prep: NONE SEEN

## 2021-02-18 LAB — AMNISURE RUPTURE OF MEMBRANE (ROM) NOT AT ARMC: Amnisure ROM: NEGATIVE

## 2021-02-18 MED ORDER — TERCONAZOLE 0.4 % VA CREA
1.0000 | TOPICAL_CREAM | Freq: Every day | VAGINAL | 0 refills | Status: DC
  Filled 2021-02-18: qty 45, 7d supply, fill #0

## 2021-02-18 NOTE — Discharge Instructions (Signed)
Vaginal Yeast Infection, Adult  Vaginal yeast infection is a condition that causes vaginal discharge as well as soreness, swelling, and redness (inflammation) of the vagina. This is a common condition. Some women get this infection frequently. What are the causes? This condition is caused by a change in the normal balance of the yeast (candida) and bacteria that live in the vagina. This change causes an overgrowth of yeast, which causes the inflammation. What increases the risk? The condition is more likely to develop in women who:  Take antibiotic medicines.  Have diabetes.  Take birth control pills.  Are pregnant.  Douche often.  Have a weak body defense system (immune system).  Have been taking steroid medicines for a long time.  Frequently wear tight clothing. What are the signs or symptoms? Symptoms of this condition include:  White, thick, creamy vaginal discharge.  Swelling, itching, redness, and irritation of the vagina. The lips of the vagina (vulva) may be affected as well.  Pain or a burning feeling while urinating.  Pain during sex. How is this diagnosed? This condition is diagnosed based on:  Your medical history.  A physical exam.  A pelvic exam. Your health care provider will examine a sample of your vaginal discharge under a microscope. Your health care provider may send this sample for testing to confirm the diagnosis. How is this treated? This condition is treated with medicine. Medicines may be over-the-counter or prescription. You may be told to use one or more of the following:  Medicine that is taken by mouth (orally).  Medicine that is applied as a cream (topically).  Medicine that is inserted directly into the vagina (suppository). Follow these instructions at home: Lifestyle  Do not have sex until your health care provider approves. Tell your sex partner that you have a yeast infection. That person should go to his or her health care  provider and ask if they should also be treated.  Do not wear tight clothes, such as pantyhose or tight pants.  Wear breathable cotton underwear. General instructions  Take or apply over-the-counter and prescription medicines only as told by your health care provider.  Eat more yogurt. This may help to keep your yeast infection from returning.  Do not use tampons until your health care provider approves.  Try taking a sitz bath to help with discomfort. This is a warm water bath that is taken while you are sitting down. The water should only come up to your hips and should cover your buttocks. Do this 3-4 times per day or as told by your health care provider.  Do not douche.  If you have diabetes, keep your blood sugar levels under control.  Keep all follow-up visits as told by your health care provider. This is important.   Contact a health care provider if:  You have a fever.  Your symptoms go away and then return.  Your symptoms do not get better with treatment.  Your symptoms get worse.  You have new symptoms.  You develop blisters in or around your vagina.  You have blood coming from your vagina and it is not your menstrual period.  You develop pain in your abdomen. Summary  Vaginal yeast infection is a condition that causes discharge as well as soreness, swelling, and redness (inflammation) of the vagina.  This condition is treated with medicine. Medicines may be over-the-counter or prescription.  Take or apply over-the-counter and prescription medicines only as told by your health care provider.  Do not   douche. Do not have sex or use tampons until your health care provider approves.  Contact a health care provider if your symptoms do not get better with treatment or your symptoms go away and then return. This information is not intended to replace advice given to you by your health care provider. Make sure you discuss any questions you have with your health care  provider. Document Revised: 04/06/2019 Document Reviewed: 01/23/2018 Elsevier Patient Education  2021 Elsevier Inc.  

## 2021-02-18 NOTE — MAU Note (Signed)
Started contracting early this morning, they were about 6-7 min apart.  Has a heavy feeling in pelvis and rectum.  Felt wetness around 0850, went to bathroom- a lot of fluid in underwear, has had a little since. Not wearing a pad,  No hx of PTL.

## 2021-02-18 NOTE — MAU Provider Note (Signed)
Patient Tammy Mejia is a 30 y.o.  [phone]  at [redacted]w[redacted]d here with complaints of LOF this morning at 8:55 this morning. She denies vaginal bleeding, decreased fetal movements. She reports that she started having some contractions "before she got up" this morning.   She went to work and felt that she was leaking fluid; she noticed that her underwear was wet. There was no color and no odor to the discharge.  History     CSN: [id number]  Arrival date and time: 02/18/21 1034   None     No chief complaint on file.  Vaginal Discharge The patient's primary symptoms include vaginal discharge. The patient's pertinent negatives include no genital itching. This is a new problem. The current episode started today. The problem occurs intermittently. The problem has been unchanged. Pertinent negatives include no abdominal pain, constipation, diarrhea, dysuria, fever or urgency. The vaginal discharge was clear. There has been no bleeding. She has not been passing clots.    OB History    Gravida  4   Para  1   Term  1   Preterm      AB  2   Living  1     SAB  2   IAB      Ectopic      Multiple  0   Live Births  1           Past Medical History:  Diagnosis Date  . Anemia   . Anxiety   . 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 Back pain   . Deficiency of potassium   . Depression    not currently taking meds, doesn't want to take when preg, has therapist  . HPV in female   . Hypokalemia   . Migraine   . PID (pelvic inflammatory disease)   . Scoliosis     Past Surgical History:  Procedure Laterality Date  . DILATION AND EVACUATION N/A 05/13/2017   Procedure: DILATATION AND EVACUATION;  Surgeon: Reva Bores, MD;  Location: [facility];  Service: Gynecology;  Laterality: N/A;  . NO PAST SURGERIES      Family History  Problem Relation Age of Onset  . COPD Mother   . Diabetes Father   . COPD Father   . Cancer Neg Hx   . Hypertension Neg Hx   . Stroke  Neg Hx     Social History   Tobacco Use  . Smoking status: Never Smoker  . Smokeless tobacco: Never Used  . Tobacco comment: smokes hooka without nicotine  Vaping Use  . Vaping Use: Never used  Substance Use Topics  . Alcohol use: No  . Drug use: No    Allergies: No Known Allergies  Medications Prior to Admission  Medication Sig Dispense Refill Last Dose  . cyclobenzaprine (FLEXERIL) 10 MG tablet Take 1 tablet (10 mg total) by mouth 3 (three) times daily as needed for muscle spasms. 30 tablet 2 Past Week at Unknown time  . Prenatal Vit-Fe Fumarate-FA (MULTIVITAMIN-PRENATAL) 27-0.8 MG TABS tablet Take 1 tablet by mouth daily at 12 noon.   02/17/2021 at Unknown time  . ferrous gluconate (FERGON) 240 (27 FE) MG tablet Take 1 tablet (240 mg total) by mouth every other day. (Patient not taking: No sig reported) 100 tablet 3   . nitrofurantoin, macrocrystal-monohydrate, (MACROBID) 100 MG capsule TAKE 1 CAPSULE (100 MG TOTAL) BY MOUTH 2 (TWO) TIMES DAILY. (Patient not taking: No sig reported) 14 capsule  0   . sertraline (ZOLOFT) 50 MG tablet TAKE 1 TABLET (50 MG TOTAL) BY MOUTH DAILY. 30 tablet 3     Review of Systems  Constitutional: Negative for fever.  Respiratory: Negative.   Cardiovascular: Negative.   Gastrointestinal: Negative for abdominal pain, constipation and diarrhea.  Genitourinary: Positive for vaginal discharge. Negative for dysuria and urgency.  Neurological: Negative.   Psychiatric/Behavioral: Negative.    Physical Exam   Blood pressure (!) 98/59, pulse (!) 108, temperature 98.2 F (36.8 C), temperature source Oral, resp. rate 18, height 5\' 1"  (1.549 m), weight 56.3 kg, last menstrual period 07/27/2020, SpO2 100 %, unknown if currently breastfeeding.  Physical Exam Constitutional:      Appearance: Normal appearance.  HENT:     Nose: Nose normal.  Pulmonary:     Effort: Pulmonary effort is normal.  Abdominal:     General: Abdomen is flat.  Genitourinary:     General: Normal vulva.     Vagina: Vaginal discharge present.     Comments: NEFG; reddened vaginal walls, cervix appears reddened with clumpy white discharge in the vagina. Cervix is externally 1 cm but internal os is closed, middle position, long Musculoskeletal:        General: Normal range of motion.  Neurological:     Mental Status: She is alert.     MAU Course  Procedures  MDM -Amnisure negative; wet prep: positive for yeast   NST: 140 bpm, mod var, present acel, no decels, no ctx  Patient had no LOF while in MAU.   Assessment and Plan   1. Yeast infection   -patient stable for discharge -patient discharge with RX for Terazole -patient to keep appt on 02/26/2021 -reviewed warning signs and when to return to MAU; patient verbalized understanding   04/28/2021 02/18/2021, 11:33 AM

## 2021-02-19 ENCOUNTER — Ambulatory Visit (INDEPENDENT_AMBULATORY_CARE_PROVIDER_SITE_OTHER): Payer: Managed Care, Other (non HMO) | Admitting: Family Medicine

## 2021-02-19 VITALS — BP 104/73 | HR 73 | Wt 123.0 lb

## 2021-02-19 DIAGNOSIS — O99013 Anemia complicating pregnancy, third trimester: Secondary | ICD-10-CM

## 2021-02-19 DIAGNOSIS — Z3480 Encounter for supervision of other normal pregnancy, unspecified trimester: Secondary | ICD-10-CM

## 2021-02-19 DIAGNOSIS — Z3A28 28 weeks gestation of pregnancy: Secondary | ICD-10-CM

## 2021-02-19 DIAGNOSIS — R87610 Atypical squamous cells of undetermined significance on cytologic smear of cervix (ASC-US): Secondary | ICD-10-CM

## 2021-02-19 DIAGNOSIS — R87810 Cervical high risk human papillomavirus (HPV) DNA test positive: Secondary | ICD-10-CM

## 2021-02-19 LAB — GC/CHLAMYDIA PROBE AMP (~~LOC~~) NOT AT ARMC
Chlamydia: NEGATIVE
Comment: NEGATIVE
Comment: NORMAL
Neisseria Gonorrhea: NEGATIVE

## 2021-02-19 NOTE — Progress Notes (Addendum)
   PRENATAL VISIT NOTE  Subjective:  Tammy Mejia is a 30 y.o. [phone] at [redacted]w[redacted]d being seen today for ongoing prenatal care.  She is currently monitored for the following issues for this low-risk pregnancy and has Palpitations; Migraine; Anxiety and depression; ASCUS with positive high risk HPV cervical; Anemia in pregnancy; and Supervision of other normal pregnancy, antepartum on their problem list.  Patient reports back spasms and contractions at work. Can't work more than a few hours at a time without getting these. Was seen in MAU yesterady for leaking fluid and contractions - Amnisure neg.  Contractions: Not present. Vag. Bleeding: Scant.  Movement: Present. Denies leaking of fluid.   The following portions of the patient's history were reviewed and updated as appropriate: allergies, current medications, past family history, past medical history, past social history, past surgical history and problem list.   Objective:   Vitals:   02/19/21 1104  BP: 104/73  Pulse: 73  Weight: 123 lb (55.8 kg)    Fetal Status: Fetal Heart Rate (bpm): 140 Fundal Height: 28 cm Movement: Present     General:  Alert, oriented and cooperative. Patient is in no acute distress.  Skin: Skin is warm and dry. No rash noted.   Cardiovascular: Normal heart rate noted  Respiratory: Normal respiratory effort, no problems with respiration noted  Abdomen: Soft, gravid, appropriate for gestational age.  Pain/Pressure: Present     Pelvic: Cervical exam deferred        Extremities: Normal range of motion.  Edema: Trace  Mental Status: Normal mood and affect. Normal behavior. Normal judgment and thought content.   Assessment and Plan:  Pregnancy: G4P1021 at [redacted]w[redacted]d 1. [redacted] weeks gestation of pregnancy  2. Supervision of other normal pregnancy, antepartum FHT and FH normal. Quick US done - normal AFI. Because of preterm contractions and back spasms, will take out of work.  3. Anemia during pregnancy in third  trimester On iron  4. ASCUS with positive high risk HPV cervical ASCUS in 2019, Normal PAP 2020. Rpt pain postpartum  Preterm labor symptoms and general obstetric precautions including but not limited to vaginal bleeding, contractions, leaking of fluid and fetal movement were reviewed in detail with the patient. Please refer to After Visit Summary for other counseling recommendations.   No follow-ups on file.  Future Appointments  Date Time Provider Department Center  02/26/2021  3:30 PM Levie Heritage, DO [facility] None  03/18/2021  4:00 PM Willodean Rosenthal, MD [facility] None  04/02/2021  4:00 PM Levie Heritage, DO [facility] None  04/15/2021  3:30 PM Willodean Rosenthal, MD [facility] None    Levie Heritage, DO

## 2021-02-20 ENCOUNTER — Other Ambulatory Visit (HOSPITAL_BASED_OUTPATIENT_CLINIC_OR_DEPARTMENT_OTHER): Payer: Self-pay

## 2021-02-20 MED FILL — Sertraline HCl Tab 50 MG: ORAL | 30 days supply | Qty: 30 | Fill #0 | Status: CN

## 2021-02-26 ENCOUNTER — Ambulatory Visit (INDEPENDENT_AMBULATORY_CARE_PROVIDER_SITE_OTHER): Payer: Managed Care, Other (non HMO) | Admitting: Family Medicine

## 2021-02-26 ENCOUNTER — Other Ambulatory Visit: Payer: Self-pay

## 2021-02-26 VITALS — BP 99/62 | HR 93 | Wt 124.0 lb

## 2021-02-26 DIAGNOSIS — Z3480 Encounter for supervision of other normal pregnancy, unspecified trimester: Secondary | ICD-10-CM

## 2021-02-26 DIAGNOSIS — R87810 Cervical high risk human papillomavirus (HPV) DNA test positive: Secondary | ICD-10-CM

## 2021-02-26 DIAGNOSIS — O99013 Anemia complicating pregnancy, third trimester: Secondary | ICD-10-CM

## 2021-02-26 DIAGNOSIS — R87610 Atypical squamous cells of undetermined significance on cytologic smear of cervix (ASC-US): Secondary | ICD-10-CM

## 2021-02-26 DIAGNOSIS — Z3A29 29 weeks gestation of pregnancy: Secondary | ICD-10-CM

## 2021-02-26 NOTE — Progress Notes (Signed)
   PRENATAL VISIT NOTE  Subjective:  Tammy Mejia is a 30 y.o. [phone] at [redacted]w[redacted]d being seen today for ongoing prenatal care.  She is currently monitored for the following issues for this low-risk pregnancy and has Palpitations; Migraine; Anxiety and depression; ASCUS with positive high risk HPV cervical; Anemia in pregnancy; and Supervision of other normal pregnancy, antepartum on their problem list.  Patient reports no complaints.  Contractions: Irregular. Vag. Bleeding: None.  Movement: Present. Denies leaking of fluid.   The following portions of the patient's history were reviewed and updated as appropriate: allergies, current medications, past family history, past medical history, past social history, past surgical history and problem list.   Objective:   Vitals:   02/26/21 1532  BP: 99/62  Pulse: 93  Weight: 124 lb (56.2 kg)    Fetal Status: Fetal Heart Rate (bpm): 154   Movement: Present     General:  Alert, oriented and cooperative. Patient is in no acute distress.  Skin: Skin is warm and dry. No rash noted.   Cardiovascular: Normal heart rate noted  Respiratory: Normal respiratory effort, no problems with respiration noted  Abdomen: Soft, gravid, appropriate for gestational age.  Pain/Pressure: Present     Pelvic: Cervical exam deferred        Extremities: Normal range of motion.  Edema: Trace  Mental Status: Normal mood and affect. Normal behavior. Normal judgment and thought content.   Assessment and Plan:  Pregnancy: G4P1021 at [redacted]w[redacted]d 1. [redacted] weeks gestation of pregnancy  2. Supervision of other normal pregnancy, antepartum FHT and FH normal  3. Anemia during pregnancy in third trimester On iron  4. H/o ASCUS with positive high risk HPV cervical ASCUS +HPV on 2019 with Normal in 2020. Needs PAP postpartum  Preterm labor symptoms and general obstetric precautions including but not limited to vaginal bleeding, contractions, leaking of fluid and fetal movement were  reviewed in detail with the patient. Please refer to After Visit Summary for other counseling recommendations.   No follow-ups on file.  Future Appointments  Date Time Provider Department Center  03/18/2021  4:00 PM Willodean Rosenthal, MD [facility] None  04/02/2021  4:00 PM Levie Heritage, DO [facility] None  04/15/2021  3:30 PM Willodean Rosenthal, MD [facility] None    Levie Heritage, DO

## 2021-02-26 NOTE — Progress Notes (Signed)
+   Fetal movement. No complaints.  

## 2021-02-27 ENCOUNTER — Other Ambulatory Visit (HOSPITAL_BASED_OUTPATIENT_CLINIC_OR_DEPARTMENT_OTHER): Payer: Self-pay

## 2021-03-17 ENCOUNTER — Telehealth: Payer: Self-pay

## 2021-03-17 NOTE — Telephone Encounter (Signed)
Pt called stating she is having some itching on her arms and hands and she notice a bruise on on her side the size of a quarter. She states she also has some redness and discoloration on her side. Pt was advised to go to [facility] but states [facility] is closer to her house. Pt is scheduled for a f/u  OV tomorrow. Bree Heinzelman l Kieley Akter, CMA

## 2021-03-18 ENCOUNTER — Other Ambulatory Visit: Payer: Self-pay

## 2021-03-18 ENCOUNTER — Encounter: Payer: Self-pay | Admitting: Obstetrics & Gynecology

## 2021-03-18 ENCOUNTER — Ambulatory Visit (INDEPENDENT_AMBULATORY_CARE_PROVIDER_SITE_OTHER): Payer: Managed Care, Other (non HMO) | Admitting: Obstetrics & Gynecology

## 2021-03-18 VITALS — BP 102/68 | HR 82 | Wt 128.0 lb

## 2021-03-18 DIAGNOSIS — Z3480 Encounter for supervision of other normal pregnancy, unspecified trimester: Secondary | ICD-10-CM

## 2021-03-18 DIAGNOSIS — O99019 Anemia complicating pregnancy, unspecified trimester: Secondary | ICD-10-CM

## 2021-03-18 DIAGNOSIS — Z3A32 32 weeks gestation of pregnancy: Secondary | ICD-10-CM

## 2021-03-18 NOTE — Progress Notes (Deleted)
weeks

## 2021-03-18 NOTE — Progress Notes (Signed)
   PRENATAL VISIT NOTE  Subjective:  Tammy Mejia is a 30 y.o. [phone] at [redacted]w[redacted]d being seen today for ongoing prenatal care.  She is currently monitored for the following issues for this low-risk pregnancy and has Palpitations; Migraine; Anxiety and depression; ASCUS with positive high risk HPV cervical; Anemia in pregnancy; and Supervision of other normal pregnancy, antepartum on their problem list.  Patient reports  pt reports itching all over. Was seen at [facility] and had labs which were WNL .  Contractions: Irritability. Vag. Bleeding: None.  Movement: Present. Denies leaking of fluid.   The following portions of the patient's history were reviewed and updated as appropriate: allergies, current medications, past family history, past medical history, past social history, past surgical history and problem list.   Objective:   Vitals:   03/18/21 1604  BP: 102/68  Pulse: 82  Weight: 128 lb (58.1 kg)    Fetal Status: Fetal Heart Rate (bpm): 149   Movement: Present     General:  Alert, oriented and cooperative. Patient is in no acute distress.  Skin: Skin is warm and dry. No rash noted.   Cardiovascular: Normal heart rate noted  Respiratory: Normal respiratory effort, no problems with respiration noted  Abdomen: Soft, gravid, appropriate for gestational age.  Pain/Pressure: Present     Pelvic: Cervical exam deferred        Extremities: Normal range of motion.  Edema: Trace  Mental Status: Normal mood and affect. Normal behavior. Normal judgment and thought content.   Assessment and Plan:  Pregnancy: G4P1021 at [redacted]w[redacted]d 1. [redacted] weeks gestation of pregnancy FHT and FH WNL   2. Supervision of other normal pregnancy, antepartum  3. Itching- generalized Etiology unk Labs WNL   Preterm labor symptoms and general obstetric precautions including but not limited to vaginal bleeding, contractions, leaking of fluid and fetal movement were reviewed in detail with the patient. Please refer to  After Visit Summary for other counseling recommendations.   No follow-ups on file.  Future Appointments  Date Time Provider Department Center  04/02/2021  4:00 PM Levie Heritage, DO [facility] None  04/15/2021  3:30 PM Willodean Rosenthal, MD [facility] None    Willodean Rosenthal, MD

## 2021-04-02 ENCOUNTER — Ambulatory Visit (INDEPENDENT_AMBULATORY_CARE_PROVIDER_SITE_OTHER): Payer: Managed Care, Other (non HMO) | Admitting: Family Medicine

## 2021-04-02 ENCOUNTER — Other Ambulatory Visit: Payer: Self-pay

## 2021-04-02 VITALS — BP 106/84 | HR 91 | Wt 128.0 lb

## 2021-04-02 DIAGNOSIS — Z3480 Encounter for supervision of other normal pregnancy, unspecified trimester: Secondary | ICD-10-CM

## 2021-04-02 DIAGNOSIS — Z3A34 34 weeks gestation of pregnancy: Secondary | ICD-10-CM

## 2021-04-02 DIAGNOSIS — O99019 Anemia complicating pregnancy, unspecified trimester: Secondary | ICD-10-CM

## 2021-04-02 DIAGNOSIS — R87810 Cervical high risk human papillomavirus (HPV) DNA test positive: Secondary | ICD-10-CM

## 2021-04-02 DIAGNOSIS — R87610 Atypical squamous cells of undetermined significance on cytologic smear of cervix (ASC-US): Secondary | ICD-10-CM

## 2021-04-02 NOTE — Progress Notes (Signed)
   PRENATAL VISIT NOTE  Subjective:  Tammy Mejia is a 30 y.o. [phone] at [redacted]w[redacted]d being seen today for ongoing prenatal care.  She is currently monitored for the following issues for this low-risk pregnancy and has Palpitations; Migraine; Anxiety and depression; ASCUS with positive high risk HPV cervical; Anemia in pregnancy; and Supervision of other normal pregnancy, antepartum on their problem list.  Patient reports occasional contractions.  Contractions: Irritability. Vag. Bleeding: None.  Movement: Present. Denies leaking of fluid.   The following portions of the patient's history were reviewed and updated as appropriate: allergies, current medications, past family history, past medical history, past social history, past surgical history and problem list.   Objective:   Vitals:   04/02/21 1601  BP: 106/84  Pulse: 91  Weight: 128 lb (58.1 kg)    Fetal Status: Fetal Heart Rate (bpm): 144 Fundal Height: 34 cm Movement: Present  Presentation: Vertex  General:  Alert, oriented and cooperative. Patient is in no acute distress.  Skin: Skin is warm and dry. No rash noted.   Cardiovascular: Normal heart rate noted  Respiratory: Normal respiratory effort, no problems with respiration noted  Abdomen: Soft, gravid, appropriate for gestational age.  Pain/Pressure: Present     Pelvic: Cervical exam performed in the presence of a chaperone Dilation: 1 Effacement (%): Thick Station: -3  Extremities: Normal range of motion.  Edema: Trace  Mental Status: Normal mood and affect. Normal behavior. Normal judgment and thought content.   Assessment and Plan:  Pregnancy: G4P1021 at [redacted]w[redacted]d 1. [redacted] weeks gestation of pregnancy  2. Supervision of other normal pregnancy, antepartum FHT and FH normal  3. Antepartum anemia On iron  4. ASCUS with positive high risk HPV cervical PAP PP  Preterm labor symptoms and general obstetric precautions including but not limited to vaginal bleeding, contractions,  leaking of fluid and fetal movement were reviewed in detail with the patient. Please refer to After Visit Summary for other counseling recommendations.   No follow-ups on file.  Future Appointments  Date Time Provider Department Center  04/15/2021  3:30 PM Willodean Rosenthal, MD [facility] None  04/20/2021 10:35 AM Willodean Rosenthal, MD [facility] None  04/30/2021  1:10 PM Levie Heritage, DO [facility] None    Levie Heritage, DO

## 2021-04-06 ENCOUNTER — Telehealth: Payer: Self-pay

## 2021-04-06 NOTE — Telephone Encounter (Signed)
Pt called stating she noticed swelling in her feet last night. She states she checked her BP and her BP was 136/78. She denies blurred vision, dizziness, and headaches. I advised pt to increase fluids, to check her BP again today and to call if BP is 140s over 90s. Understanding was voiced. Yahir Tavano l Freddi Forster, CMA

## 2021-04-15 ENCOUNTER — Other Ambulatory Visit: Payer: Self-pay

## 2021-04-15 ENCOUNTER — Ambulatory Visit (INDEPENDENT_AMBULATORY_CARE_PROVIDER_SITE_OTHER): Payer: Managed Care, Other (non HMO) | Admitting: Obstetrics & Gynecology

## 2021-04-15 ENCOUNTER — Other Ambulatory Visit (HOSPITAL_COMMUNITY)
Admission: RE | Admit: 2021-04-15 | Discharge: 2021-04-15 | Disposition: A | Discharge status: Home / Self Care | Payer: Managed Care, Other (non HMO) | Source: Ambulatory Visit | Attending: Obstetrics & Gynecology | Admitting: Obstetrics & Gynecology

## 2021-04-15 VITALS — BP 127/90 | HR 82 | Wt 135.8 lb

## 2021-04-15 DIAGNOSIS — O99019 Anemia complicating pregnancy, unspecified trimester: Secondary | ICD-10-CM

## 2021-04-15 DIAGNOSIS — R87810 Cervical high risk human papillomavirus (HPV) DNA test positive: Secondary | ICD-10-CM

## 2021-04-15 DIAGNOSIS — Z3480 Encounter for supervision of other normal pregnancy, unspecified trimester: Secondary | ICD-10-CM

## 2021-04-15 DIAGNOSIS — R87610 Atypical squamous cells of undetermined significance on cytologic smear of cervix (ASC-US): Secondary | ICD-10-CM

## 2021-04-15 DIAGNOSIS — F419 Anxiety disorder, unspecified: Secondary | ICD-10-CM

## 2021-04-15 DIAGNOSIS — F32A Depression, unspecified: Secondary | ICD-10-CM

## 2021-04-15 DIAGNOSIS — Z3A36 36 weeks gestation of pregnancy: Secondary | ICD-10-CM

## 2021-04-15 NOTE — Progress Notes (Signed)
   PRENATAL VISIT NOTE  Subjective:  Tammy Mejia is a 30 y.o. [phone] at [redacted]w[redacted]d being seen today for ongoing prenatal care.  She is currently monitored for the following issues for this low-risk pregnancy and has Palpitations; Migraine; Anxiety and depression; ASCUS with positive high risk HPV cervical; Anemia in pregnancy; and Supervision of other normal pregnancy, antepartum on their problem list.  Patient reports  freq contractions . Pt reports depression and anxiety. She reports that most of it is at her baseline.  Contractions: Irritability. Vag. Bleeding: None.  Movement: Present. Denies leaking of fluid.   The following portions of the patient's history were reviewed and updated as appropriate: allergies, current medications, past family history, past medical history, past social history, past surgical history and problem list.   Objective:   Vitals:   04/15/21 1538  BP: 127/90  Pulse: 82  Weight: 135 lb 12.8 oz (61.6 kg)    Fetal Status: Fetal Heart Rate (bpm): 145   Movement: Present     General:  Alert, oriented and cooperative. Patient is in no acute distress.  Skin: Skin is warm and dry. No rash noted.   Cardiovascular: Normal heart rate noted  Respiratory: Normal respiratory effort, no problems with respiration noted  Abdomen: Soft, gravid, appropriate for gestational age.  Pain/Pressure: Present     Pelvic: Cervical exam performed in the presence of a chaperone        Extremities: Normal range of motion.  Edema: Trace  Mental Status: Normal mood and affect. Normal behavior. Normal judgment and thought content.   Assessment and Plan:  Pregnancy: G4P1021 at [redacted]w[redacted]d 1. Supervision of other normal pregnancy, antepartum FHR and FH WNL  2. Antepartum anemia Pt reports that she lost her FeSO4 rx and is not taking meds.   CBC Latest Ref Rng & Units 02/11/2021 01/15/2021 10/30/2020  WBC 3.4 - 10.8 x10E3/uL 10.8 12.2(H) 8.6  Hemoglobin 11.1 - 15.9 g/dL 10.0(L) 10.4(L) 12.5   Hematocrit 34.0 - 46.6 % 30.8(L) 31.8(L) 36.7  Platelets 150 - 450 x10E3/uL 352 342 283     3. ASCUS with positive high risk HPV cervical   4. Anxiety and depression Pts PHQ9 score is pos. 18. She declines meds during pregnancy.  She reports that she has a counselor who she has not been able to reach She will commit to connecting with her this week and seeing her even if its a virtual visit (which the provider is doing now but, the pt prefers in person).   Preterm labor symptoms and general obstetric precautions including but not limited to vaginal bleeding, contractions, leaking of fluid and fetal movement were reviewed in detail with the patient. Please refer to After Visit Summary for other counseling recommendations.   Return in about 1 week (around 04/22/2021).  Future Appointments  Date Time Provider Department Center  04/20/2021 10:35 AM Willodean Rosenthal, MD [facility] None  04/30/2021  1:10 PM Levie Heritage, DO [facility] None    Willodean Rosenthal, MD

## 2021-04-17 LAB — GC/CHLAMYDIA PROBE AMP (~~LOC~~) NOT AT ARMC
Chlamydia: NEGATIVE
Comment: NEGATIVE
Comment: NORMAL
Neisseria Gonorrhea: NEGATIVE

## 2021-04-19 LAB — CULTURE, BETA STREP (GROUP B ONLY): Strep Gp B Culture: NEGATIVE

## 2021-04-20 ENCOUNTER — Other Ambulatory Visit: Payer: Self-pay

## 2021-04-20 ENCOUNTER — Ambulatory Visit (INDEPENDENT_AMBULATORY_CARE_PROVIDER_SITE_OTHER): Payer: Managed Care, Other (non HMO) | Admitting: Obstetrics & Gynecology

## 2021-04-20 VITALS — BP 139/85 | HR 82 | Wt 135.0 lb

## 2021-04-20 DIAGNOSIS — O99013 Anemia complicating pregnancy, third trimester: Secondary | ICD-10-CM

## 2021-04-20 DIAGNOSIS — F32A Depression, unspecified: Secondary | ICD-10-CM

## 2021-04-20 DIAGNOSIS — O99343 Other mental disorders complicating pregnancy, third trimester: Secondary | ICD-10-CM | POA: Diagnosis not present

## 2021-04-20 DIAGNOSIS — R87610 Atypical squamous cells of undetermined significance on cytologic smear of cervix (ASC-US): Secondary | ICD-10-CM

## 2021-04-20 DIAGNOSIS — R87810 Cervical high risk human papillomavirus (HPV) DNA test positive: Secondary | ICD-10-CM

## 2021-04-20 DIAGNOSIS — O99019 Anemia complicating pregnancy, unspecified trimester: Secondary | ICD-10-CM

## 2021-04-20 DIAGNOSIS — Z3480 Encounter for supervision of other normal pregnancy, unspecified trimester: Secondary | ICD-10-CM

## 2021-04-20 DIAGNOSIS — Z3A37 37 weeks gestation of pregnancy: Secondary | ICD-10-CM | POA: Diagnosis not present

## 2021-04-20 DIAGNOSIS — F419 Anxiety disorder, unspecified: Secondary | ICD-10-CM

## 2021-04-20 NOTE — Progress Notes (Signed)
   PRENATAL VISIT NOTE  Subjective:  Tammy Mejia is a 30 y.o. [phone] at [redacted]w[redacted]d being seen today for ongoing prenatal care.  She is currently monitored for the following issues for this low-risk pregnancy and has Palpitations; Migraine; Anxiety and depression; ASCUS with positive high risk HPV cervical; Anemia in pregnancy; and Supervision of other normal pregnancy, antepartum on their problem list.  Patient reports occasional contractions.  Contractions: Irritability. Vag. Bleeding: None.  Movement: Present. Denies leaking of fluid.   The following portions of the patient's history were reviewed and updated as appropriate: allergies, current medications, past family history, past medical history, past social history, past surgical history and problem list.   Objective:   Vitals:   04/20/21 1034  BP: (!) 142/92  Pulse: 71  Weight: 135 lb (61.2 kg)    Fetal Status: Fetal Heart Rate (bpm): 135   Movement: Present     General:  Alert, oriented and cooperative. Patient is in no acute distress.  Skin: Skin is warm and dry. No rash noted.   Cardiovascular: Normal heart rate noted  Respiratory: Normal respiratory effort, no problems with respiration noted  Abdomen: Soft, gravid, appropriate for gestational age.  Pain/Pressure: Present     Pelvic: Cervical exam deferred        Extremities: Normal range of motion.  Edema: Trace  Mental Status: Normal mood and affect. Normal behavior. Normal judgment and thought content.   Assessment and Plan:  Pregnancy: G4P1021 at [redacted]w[redacted]d 1. [redacted] weeks gestation of pregnancy Pt gets care here but, plans to deliver at [facility]. Pt again warned that this was not the best plan but, she likes her OBs jhere but, does not want to deliver at [facility].   2. Supervision of other normal pregnancy, antepartum FH and FHR WNL  3. Antepartum anemia Taking PNV.  Not taking FeSO4  4. Anxiety and depression Stable  5. ASCUS with positive high risk HPV  cervical   Term labor symptoms and general obstetric precautions including but not limited to vaginal bleeding, contractions, leaking of fluid and fetal movement were reviewed in detail with the patient. Please refer to After Visit Summary for other counseling recommendations.   Return in about 1 week (around 04/27/2021).  Future Appointments  Date Time Provider Department Center  04/30/2021  1:10 PM Levie Heritage, DO [facility] None    Willodean Rosenthal, MD

## 2021-04-20 NOTE — Progress Notes (Signed)
Patient states she is nervous today about FHR (hx of a loss). Patient brought friend for assurance today. Armandina Stammer RN

## 2021-04-29 ENCOUNTER — Encounter: Payer: Managed Care, Other (non HMO) | Admitting: Family Medicine

## 2021-04-30 ENCOUNTER — Encounter: Payer: Managed Care, Other (non HMO) | Admitting: Family Medicine

## 2021-05-08 ENCOUNTER — Ambulatory Visit: Payer: Managed Care, Other (non HMO) | Admitting: Family Medicine

## 2021-05-08 NOTE — Progress Notes (Deleted)
Delivered [facility] on 04/27/2021 vaginal delivery with postpartum hemorrhage. [facility] wanted her to follow up in one week.Armandina Stammer RN

## 2021-05-14 ENCOUNTER — Ambulatory Visit (INDEPENDENT_AMBULATORY_CARE_PROVIDER_SITE_OTHER): Payer: Managed Care, Other (non HMO) | Admitting: Family Medicine

## 2021-05-14 ENCOUNTER — Other Ambulatory Visit: Payer: Self-pay

## 2021-05-14 DIAGNOSIS — M9902 Segmental and somatic dysfunction of thoracic region: Secondary | ICD-10-CM

## 2021-05-14 DIAGNOSIS — M9903 Segmental and somatic dysfunction of lumbar region: Secondary | ICD-10-CM | POA: Diagnosis not present

## 2021-05-14 DIAGNOSIS — Z0130 Encounter for examination of blood pressure without abnormal findings: Secondary | ICD-10-CM | POA: Diagnosis not present

## 2021-05-14 DIAGNOSIS — M9904 Segmental and somatic dysfunction of sacral region: Secondary | ICD-10-CM

## 2021-05-14 DIAGNOSIS — M9905 Segmental and somatic dysfunction of pelvic region: Secondary | ICD-10-CM

## 2021-05-14 DIAGNOSIS — M546 Pain in thoracic spine: Secondary | ICD-10-CM | POA: Diagnosis not present

## 2021-05-14 DIAGNOSIS — M9908 Segmental and somatic dysfunction of rib cage: Secondary | ICD-10-CM

## 2021-05-14 NOTE — Progress Notes (Signed)
   Subjective:    Patient ID: Tammy Mejia, female    DOB: 01-Mar-1991, 30 y.o.   MRN: [id number]  HPI Patient seen 1 week postpartum for blood pressure check.  She had been diagnosed with preeclampsia during delivery at [facility].  Patient's blood pressure is normal here.  She did have a hemorrhage and had a blood transfusion and an iron infusion.  She is having no difficulty.  She does complain of back pain in her upper lumbar and lower Thoracics.  Pain is fairly constant and worse with movement and improved with rest.  No radiation of the pain.   Review of Systems    BP 102/68   Pulse 97   Wt 124 lb (56.2 kg)   LMP 07/27/2020   Breastfeeding Yes   BMI 23.43 kg/m   Objective:   Physical Exam Vitals reviewed.  Constitutional:      Appearance: Normal appearance.  Cardiovascular:     Rate and Rhythm: Normal rate and regular rhythm.     Pulses: Normal pulses.  Pulmonary:     Effort: Pulmonary effort is normal.     Breath sounds: Normal breath sounds.  Musculoskeletal:     Comments: OSE:  T10-12 FSRL Ribs 10-12 inhaled L1 ESRL,L5 ESRR L/L torsion Right ant Innom  Skin:    Capillary Refill: Capillary refill takes less than 2 seconds.  Neurological:     General: No focal deficit present.     Mental Status: She is alert.  Psychiatric:        Mood and Affect: Mood normal.        Behavior: Behavior normal.        Thought Content: Thought content normal.       Assessment & Plan:   1. Other immediate postpartum hemorrhage No symptoms. Will recheck at postpartum visit.  2. Blood pressure check BP normal  3. Acute bilateral thoracic back pain 4. Somatic dysfunction of spine, thoracic 5. Somatic dysfunction of rib 6. Somatic dysfunction of lumbar region 7. Somatic dysfunction of sacral region 8. Somatic dysfunction of pelvis region OMT done after patient permission. HVLA technique utilized. 5 areas treated with improvement of tissue texture and joint mobility. Patient  tolerated procedure well.

## 2021-06-11 ENCOUNTER — Other Ambulatory Visit: Payer: Self-pay

## 2021-06-11 ENCOUNTER — Encounter: Payer: Self-pay | Admitting: Family Medicine

## 2021-06-11 ENCOUNTER — Ambulatory Visit (INDEPENDENT_AMBULATORY_CARE_PROVIDER_SITE_OTHER): Payer: Managed Care, Other (non HMO) | Admitting: Family Medicine

## 2021-06-11 ENCOUNTER — Other Ambulatory Visit (HOSPITAL_BASED_OUTPATIENT_CLINIC_OR_DEPARTMENT_OTHER): Payer: Self-pay

## 2021-06-11 VITALS — BP 87/62 | HR 84 | Wt 121.0 lb

## 2021-06-11 DIAGNOSIS — F419 Anxiety disorder, unspecified: Secondary | ICD-10-CM | POA: Diagnosis not present

## 2021-06-11 DIAGNOSIS — F32A Depression, unspecified: Secondary | ICD-10-CM | POA: Diagnosis not present

## 2021-06-11 DIAGNOSIS — Z30013 Encounter for initial prescription of injectable contraceptive: Secondary | ICD-10-CM

## 2021-06-11 LAB — POCT URINE PREGNANCY: Preg Test, Ur: NEGATIVE

## 2021-06-11 MED ORDER — MEDROXYPROGESTERONE ACETATE 150 MG/ML IM SUSP
150.0000 mg | Freq: Once | INTRAMUSCULAR | Status: AC
  Administered 2021-06-11: 150 mg via INTRAMUSCULAR

## 2021-06-11 MED ORDER — MEDROXYPROGESTERONE ACETATE 150 MG/ML IM SUSY
150.0000 mg | PREFILLED_SYRINGE | INTRAMUSCULAR | 3 refills | Status: DC
  Filled 2021-06-11: qty 1, 90d supply, fill #0

## 2021-06-11 NOTE — Progress Notes (Signed)
Post Partum Visit Note  Tammy Mejia is a 30 y.o. [phone] female who presents for a postpartum visit. She is 6 weeks postpartum following a normal spontaneous vaginal delivery.  I have fully reviewed the prenatal and intrapartum course. The delivery was at 38 gestational weeks.  Anesthesia: epidural. Postpartum course has been normal. Baby is doing well. Baby is feeding by bottle - Parent's choice . Bleeding no bleeding. Bowel function is normal. Bladder function is normal. Patient is not sexually active. Contraception method is Depo-Provera injections. Postpartum depression screening: positive.   The pregnancy intention screening data noted above was reviewed. Potential methods of contraception were discussed. The patient elected to proceed with No data recorded.   Edinburgh Postnatal Depression Scale - 06/11/21 1529       Edinburgh Postnatal Depression Scale:  In the Past 7 Days   I have been able to laugh and see the funny side of things. 1    I have looked forward with enjoyment to things. 1    I have blamed myself unnecessarily when things went wrong. 2    I have been anxious or worried for no good reason. 2    I have felt scared or panicky for no good reason. 2    Things have been getting on top of me. 2    I have been so unhappy that I have had difficulty sleeping. 3    I have felt sad or miserable. 2    I have been so unhappy that I have been crying. 1    The thought of harming myself has occurred to me. 1    Edinburgh Postnatal Depression Scale Total 17             Health Maintenance Due  Topic Date Due   COVID-19 Vaccine (1) Never done   INFLUENZA VACCINE  04/20/2021    The following portions of the patient's history were reviewed and updated as appropriate: allergies, current medications, past family history, past medical history, past social history, past surgical history, and problem list.  Review of Systems Pertinent items are noted in HPI.  Objective:   BP (!) 87/62   Pulse 84   Wt 121 lb (54.9 kg)   LMP 07/27/2020   Breastfeeding No   BMI 22.86 kg/m    General:  alert, cooperative, and no distress   Breasts:  not indicated  Lungs: clear to auscultation bilaterally  Heart:  regular rate and rhythm, S1, S2 normal, no murmur, click, rub or gallop  Abdomen: soft, non-tender; bowel sounds normal; no masses,  no organomegaly   Wound N/a  GU exam:  not indicated       Assessment:     1. Encounter for initial prescription of injectable contraceptive   2. Postpartum care and examination   3. Anxiety and depression      Plan:   Essential components of care per ACOG recommendations:  1.  Mood and well being: Patient with positive depression screening today. Reviewed local resources for support - patient on medication and has counselor. - Patient tobacco use? No.   - hx of drug use? No.    2. Infant care and feeding:  -Patient currently breastmilk feeding? Yes. Reviewed importance of draining breast regularly to support lactation.  -Social determinants of health (SDOH) reviewed in EPIC. No concerns  3. Sexuality, contraception and birth spacing - Patient does not want a pregnancy in the next year.   - Reviewed forms of contraception  in tiered fashion. Patient desired Depo-Provera today.   - Discussed birth spacing of 18 months  4. Sleep and fatigue -Encouraged family/partner/community support of 4 hrs of uninterrupted sleep to help with mood and fatigue  5. Physical Recovery  - Discussed patients delivery and complications. She describes her labor as mixed. - Patient had a Vaginal, no problems at delivery.  - Patient has urinary incontinence? No. - Patient is safe to resume physical and sexual activity  6.  Health Maintenance - HM due items addressed Yes - Last pap smear  Diagnosis  Date Value Ref Range Status  03/01/2019   Final   NEGATIVE FOR INTRAEPITHELIAL LESIONS OR MALIGNANCY. BENIGN REACTIVE/REPARATIVE CHANGES.    Pap smear not done at today's visit.  -Breast Cancer screening indicated? No.   7. Chronic Disease/Pregnancy Condition follow up: None  - PCP follow up  Levie Heritage, DO [facility]

## 2021-07-14 ENCOUNTER — Telehealth: Payer: Self-pay | Admitting: General Practice

## 2021-07-14 NOTE — Telephone Encounter (Signed)
Patient is due for Annual exam and dep (Dec 8 - Dec 22) in December with Dr. Adrian Blackwater.  Left message on VM for patient to contact our office to schedule appt.

## 2021-09-06 ENCOUNTER — Encounter: Payer: Self-pay | Admitting: Family Medicine

## 2021-09-08 ENCOUNTER — Ambulatory Visit: Payer: Managed Care, Other (non HMO)

## 2021-09-09 ENCOUNTER — Other Ambulatory Visit (HOSPITAL_BASED_OUTPATIENT_CLINIC_OR_DEPARTMENT_OTHER): Payer: Self-pay

## 2021-09-09 ENCOUNTER — Other Ambulatory Visit: Payer: Self-pay

## 2021-09-09 MED ORDER — MEDROXYPROGESTERONE ACETATE 150 MG/ML IM SUSY
150.0000 mg | PREFILLED_SYRINGE | INTRAMUSCULAR | 0 refills | Status: DC
Start: 2021-09-09 — End: 2024-05-07
  Filled 2021-09-09: qty 1, 90d supply, fill #0

## 2021-09-10 ENCOUNTER — Ambulatory Visit: Payer: Managed Care, Other (non HMO)

## 2021-09-16 ENCOUNTER — Ambulatory Visit: Payer: Managed Care, Other (non HMO)

## 2021-09-18 ENCOUNTER — Other Ambulatory Visit (HOSPITAL_BASED_OUTPATIENT_CLINIC_OR_DEPARTMENT_OTHER): Payer: Self-pay

## 2021-10-07 ENCOUNTER — Other Ambulatory Visit (HOSPITAL_BASED_OUTPATIENT_CLINIC_OR_DEPARTMENT_OTHER): Payer: Self-pay

## 2021-10-07 ENCOUNTER — Ambulatory Visit: Payer: Self-pay

## 2021-10-07 ENCOUNTER — Other Ambulatory Visit: Payer: Self-pay

## 2021-10-07 MED ORDER — MEDROXYPROGESTERONE ACETATE 150 MG/ML IM SUSY
150.0000 mg | PREFILLED_SYRINGE | INTRAMUSCULAR | 0 refills | Status: DC
  Filled 2021-10-07: qty 1, 90d supply, fill #0

## 2021-10-07 MED ORDER — MEDROXYPROGESTERONE ACETATE 150 MG/ML IM SUSP: 150.0000 mg | Freq: Once | INTRAMUSCULAR | Status: DC

## 2021-10-08 NOTE — Progress Notes (Signed)
Patient went down to pharmacy to pick up Depo Provera prescription and then never came back to clinic. Patient told front desk she didn't have insurance and we gave her a estimate of what to pay for the day. Kathrene Alu RN

## 2021-10-15 ENCOUNTER — Other Ambulatory Visit (HOSPITAL_BASED_OUTPATIENT_CLINIC_OR_DEPARTMENT_OTHER): Payer: Self-pay

## 2022-01-03 IMAGING — US US OB COMP LESS 14 WK
1 series · 14 of 28 positions shown · non-contrast
Comparison: None.

CLINICAL DATA: Right lower quadrant abdominal pain, quantitative
beta hCG = 83,966

EXAM:
OBSTETRIC <14 WK ULTRASOUND
TECHNIQUE: Transabdominal ultrasound was performed for evaluation of the
gestation as well as the maternal uterus and adnexal regions.

[Series 1: us ob comp less 14 wk · 63 acquisitions, 14 frames shown]
[im 3/63]
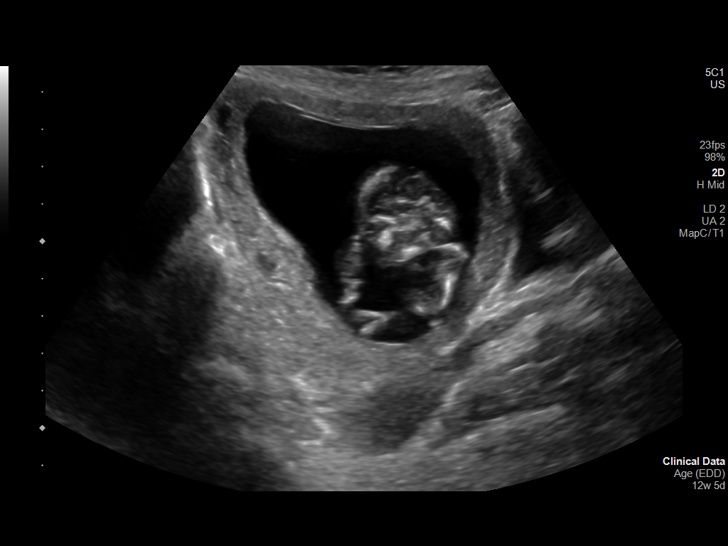
[im 7/63]
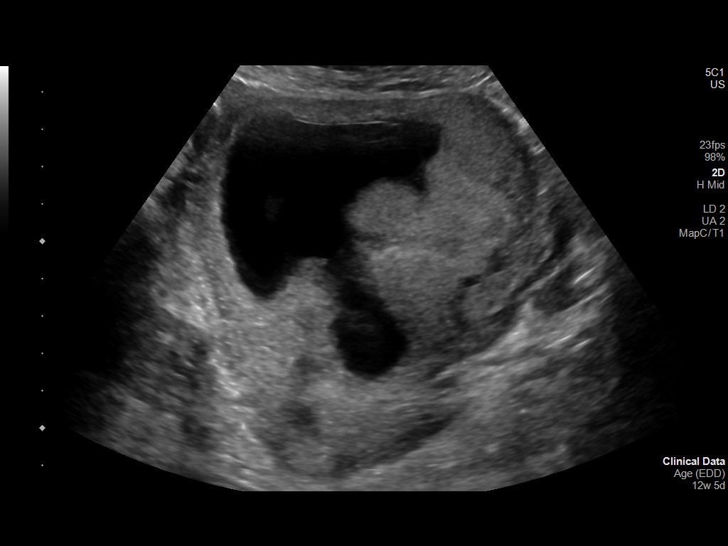
[im 12/63]
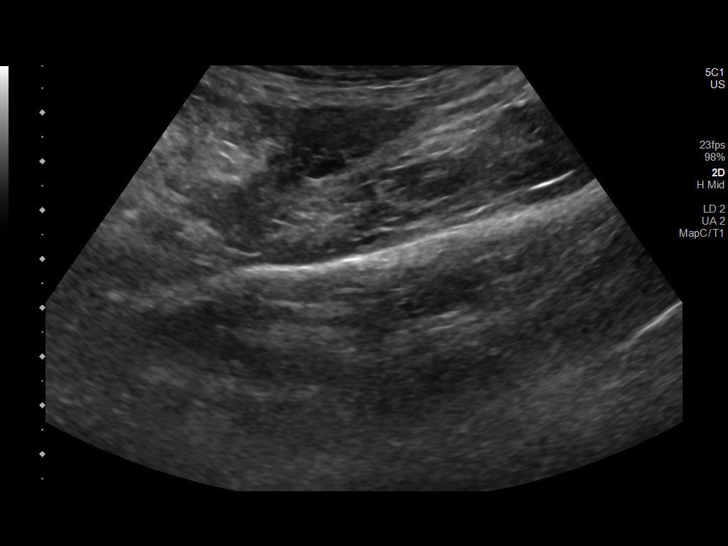
[im 17/63]
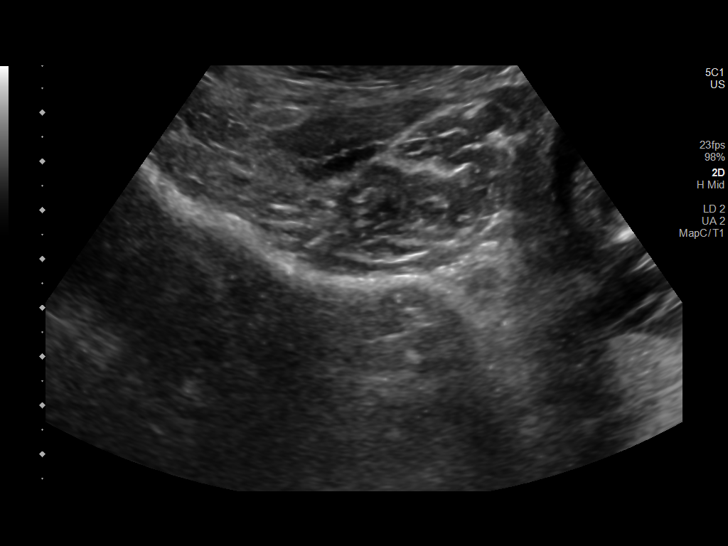
[im 21/63]
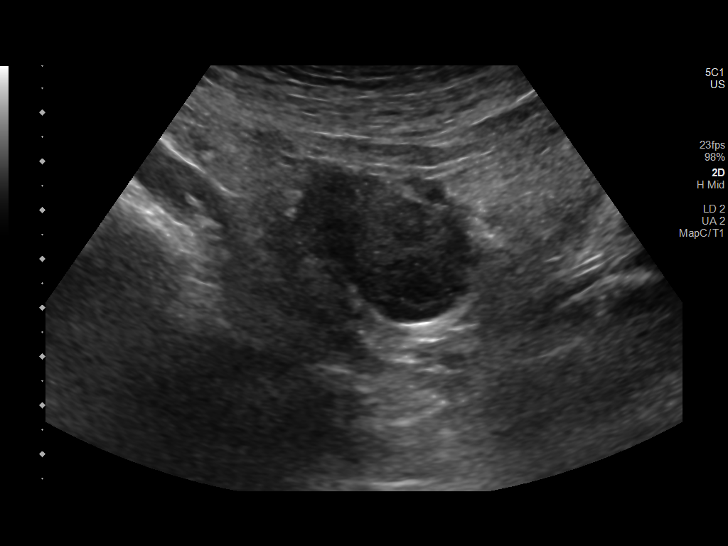
[im 26/63]
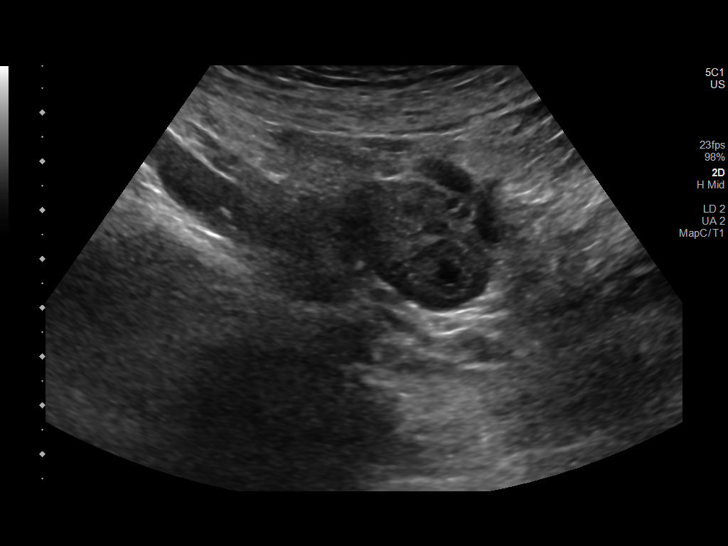
[im 30/63]
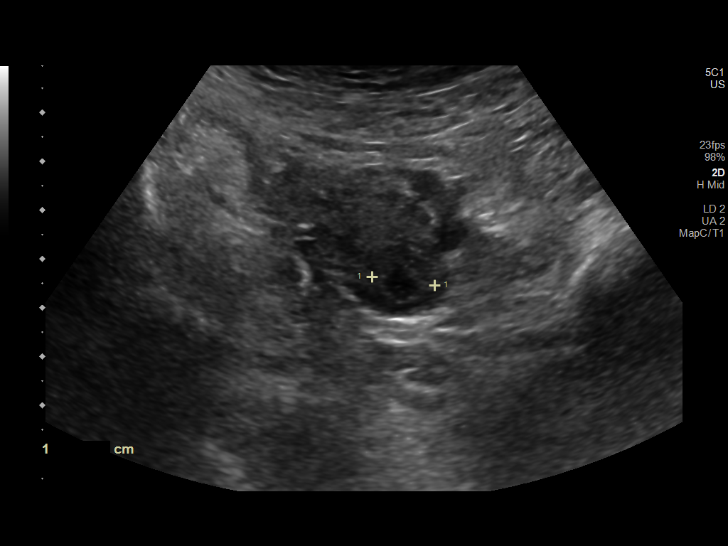
[im 35/63]
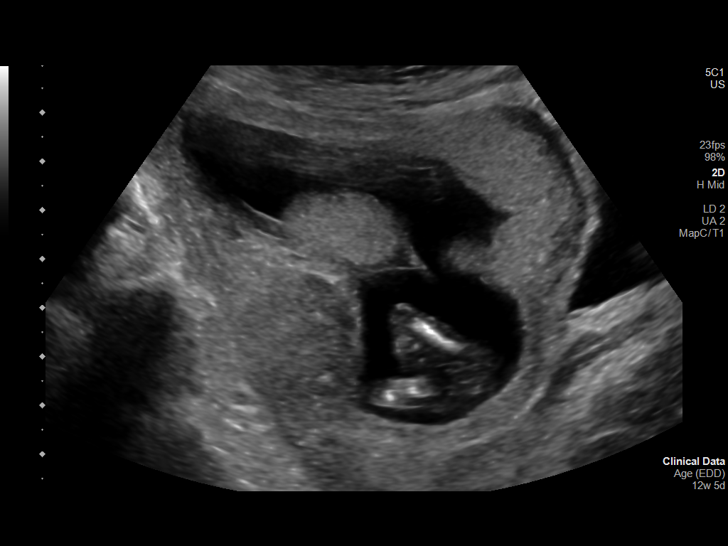
[im 40/63]
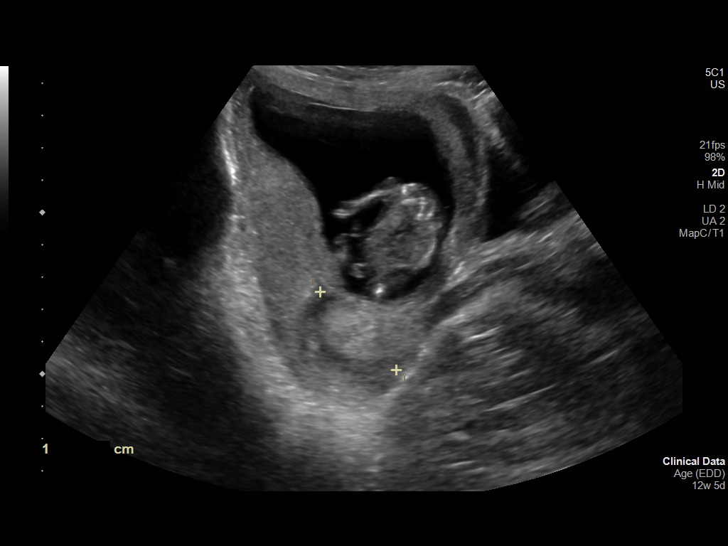
[im 44/63]
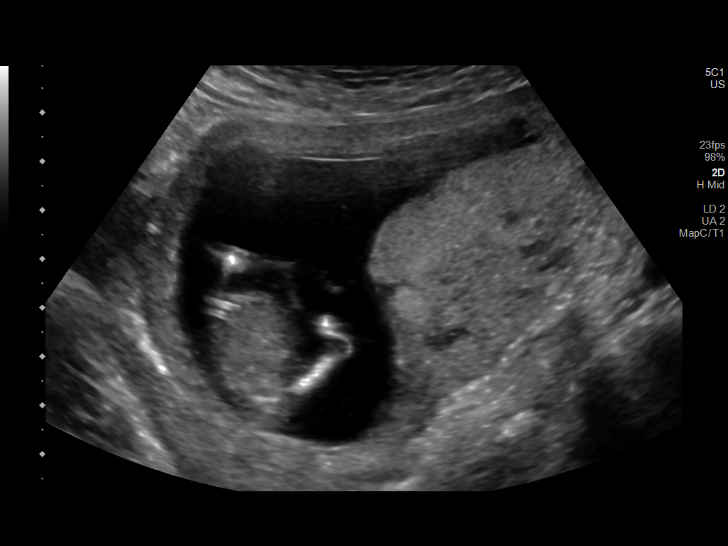
[im 49/63]
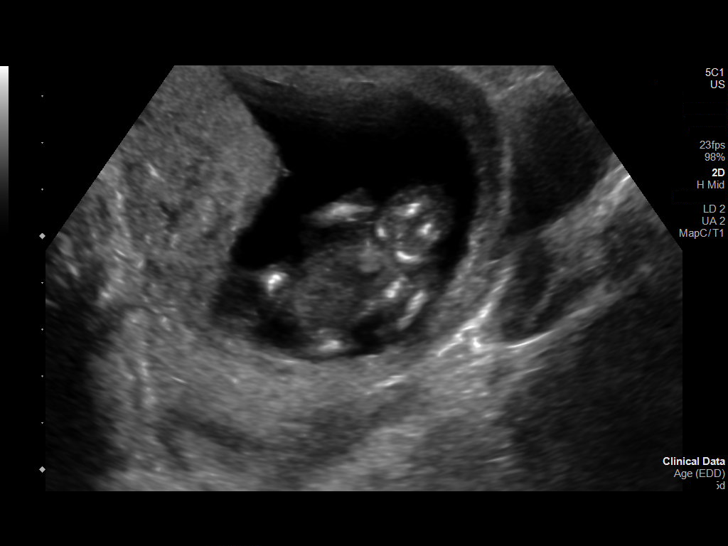
[im 53/63]
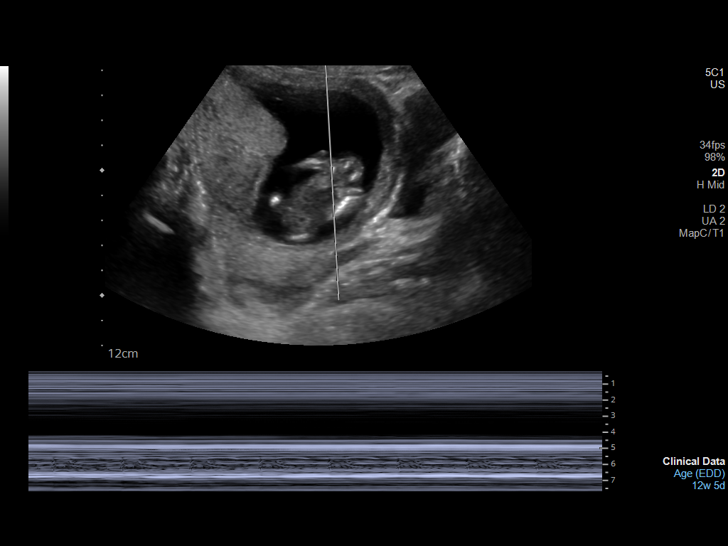
[im 58/63]
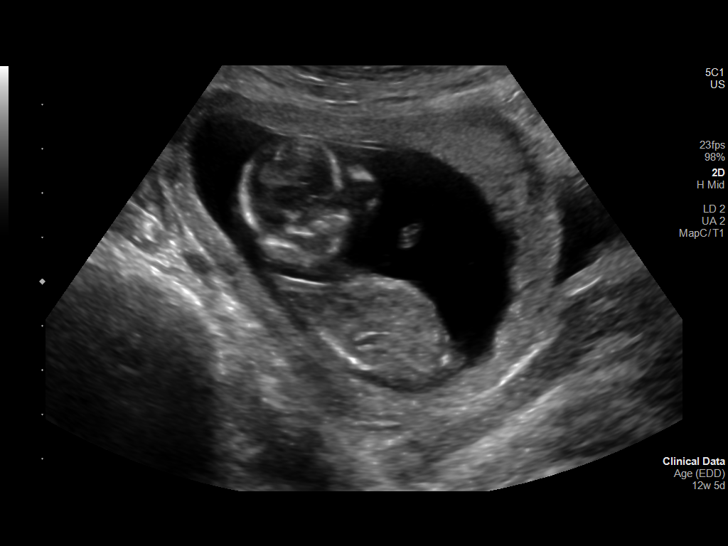
[im 63/63]
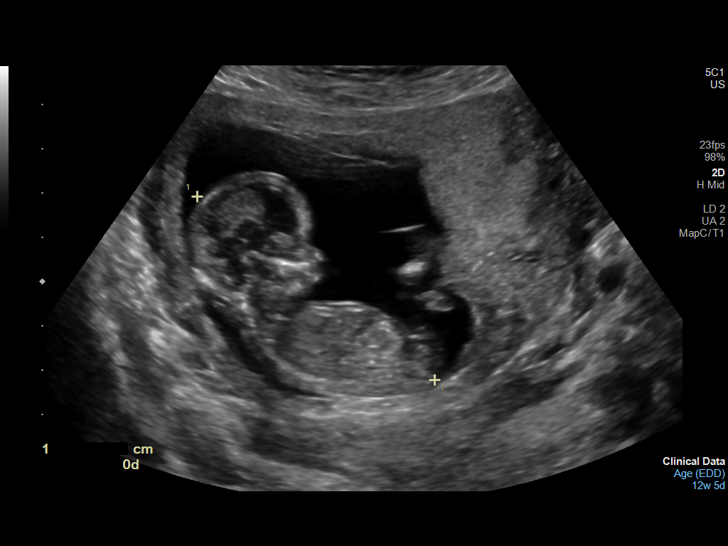

[14 of 28 positions shown; findings below may reference images not displayed]

FINDINGS: Intrauterine gestational sac: Single

Yolk sac:  Not Visualized.

Embryo:  Visualized.

Cardiac Activity: Visualized.

Heart Rate: 139 bpm

CRL:   67.8 mm   13 w 1 d                  US EDC: 05/06/2021

Subchorionic hemorrhage:  None visualized.

Maternal uterus/adnexae: Maternal uterus is otherwise unremarkable.
Thick-walled peripherally vascular probable corpus luteum seen in
the left ovary measuring 1.2 x 1.5 x 1.3 cm. No concerning adnexal
lesions. No pelvic free fluid is seen.
IMPRESSION: Single viable intrauterine gestation at 13 weeks, 1 days by
crown-rump length sonographic estimation. No acute sonographic
complication.

Nonvisualization of the yolk sac likely reflects typical physiologic
involution which occurs after the tenth week.

Probable left ovarian corpus luteum.

## 2022-04-19 ENCOUNTER — Other Ambulatory Visit (HOSPITAL_BASED_OUTPATIENT_CLINIC_OR_DEPARTMENT_OTHER): Payer: Self-pay

## 2023-02-07 ENCOUNTER — Other Ambulatory Visit (HOSPITAL_BASED_OUTPATIENT_CLINIC_OR_DEPARTMENT_OTHER): Payer: Self-pay

## 2023-03-17 ENCOUNTER — Other Ambulatory Visit: Payer: Self-pay

## 2023-03-17 ENCOUNTER — Other Ambulatory Visit (HOSPITAL_BASED_OUTPATIENT_CLINIC_OR_DEPARTMENT_OTHER): Payer: Self-pay

## 2023-03-17 ENCOUNTER — Encounter (HOSPITAL_BASED_OUTPATIENT_CLINIC_OR_DEPARTMENT_OTHER): Payer: Self-pay | Admitting: Emergency Medicine

## 2023-03-17 ENCOUNTER — Emergency Department (HOSPITAL_BASED_OUTPATIENT_CLINIC_OR_DEPARTMENT_OTHER)
Admission: EM | Admit: 2023-03-17 | Discharge: 2023-03-17 | Disposition: A | Discharge status: Home / Self Care | Payer: Self-pay | Attending: Emergency Medicine | Admitting: Emergency Medicine

## 2023-03-17 DIAGNOSIS — H60501 Unspecified acute noninfective otitis externa, right ear: Secondary | ICD-10-CM | POA: Insufficient documentation

## 2023-03-17 MED ORDER — NEOMYCIN-POLYMYXIN-DEXAMETH 3.5-10000-0.1 OP SUSP
1.0000 [drp] | Freq: Four times a day (QID) | OPHTHALMIC | 0 refills | Status: DC
  Filled 2023-03-17: qty 5, 25d supply, fill #0

## 2023-03-17 MED ORDER — NEOMYCIN-POLYMYXIN-HC 3.5-10000-1 OT SUSP
4.0000 [drp] | Freq: Three times a day (TID) | OTIC | 0 refills | Status: DC
  Filled 2023-03-17: qty 10, 17d supply, fill #0

## 2023-03-17 NOTE — ED Triage Notes (Signed)
Right ear pain since Monday after swimming on Sunday.  No drainage or fever

## 2023-03-17 NOTE — ED Provider Notes (Signed)
[place] [facility] Provider Note   CSN: [id number] Arrival date & time: 03/17/23  1250     History  Chief Complaint  Patient presents with   Otalgia    Tammy Mejia is a 32 y.o. female with past ministry of anxiety, depression, migraines presented today for evaluation of right knee pain.  States she has has pain in the right ear since Monday after swimming on Sunday.  She denies any pus or fluid drainage from her right ear.  Denies any hearing loss or pain behind her R ear.  Denies any direct trauma to her ears. Denies fever, nausea vomiting.  States she has not tried any medication for pain.   Otalgia     Past Medical History:  Diagnosis Date   Anemia    Anxiety    ASCUS with positive high risk HPV cervical 12/12/2017   Back pain    Deficiency of potassium    Depression    not currently taking meds, doesn't want to take when preg, has therapist   HPV in female    Hypokalemia    Migraine    PID (pelvic inflammatory disease)    Scoliosis    Past Surgical History:  Procedure Laterality Date   DILATION AND EVACUATION N/A 05/13/2017   Procedure: DILATATION AND EVACUATION;  Surgeon: Reva Bores, MD;  Location: [facility];  Service: Gynecology;  Laterality: N/A;   NO PAST SURGERIES       Home Medications Prior to Admission medications   Medication Sig Start Date End Date Taking? Authorizing Provider  ferrous gluconate (FERGON) 240 (27 FE) MG tablet Take 1 tablet (240 mg total) by mouth every other day. 01/16/21   Levie Heritage, DO  medroxyPROGESTERone Acetate 150 MG/ML SUSY Inject 1 mL (150 mg total) into the muscle every 3 (three) months. 06/11/21   Levie Heritage, DO  medroxyPROGESTERone Acetate 150 MG/ML SUSY Inject 1 mL (150 mg total) into the muscle every 3 (three) months. 09/09/21   Levie Heritage, DO  medroxyPROGESTERone Acetate 150 MG/ML SUSY Inject 1 mL (150 mg total) into the muscle every 3 (three) months. 10/07/21    Levie Heritage, DO      Allergies    Patient has no known allergies.    Review of Systems   Review of Systems  HENT:  Positive for ear pain.     Physical Exam Updated Vital Signs BP 99/77 (BP Location: Right Arm)   Pulse 100   Temp 98.4 F (36.9 C) (Oral)   Resp 17   Ht 5\' 1"  (1.549 m)   Wt 58.1 kg   LMP 02/21/2023   SpO2 98%   BMI 24.19 kg/m  Physical Exam Vitals and nursing note reviewed.  Constitutional:      Appearance: Normal appearance.  HENT:     Head: Normocephalic and atraumatic.     Right Ear: Tympanic membrane normal.     Left Ear: Tympanic membrane normal.     Ears:     Comments: Erythema to right ear external canal.    Mouth/Throat:     Mouth: Mucous membranes are moist.  Eyes:     General: No scleral icterus. Cardiovascular:     Rate and Rhythm: Normal rate and regular rhythm.     Pulses: Normal pulses.     Heart sounds: Normal heart sounds.  Pulmonary:     Effort: Pulmonary effort is normal.     Breath sounds: Normal  breath sounds.  Abdominal:     General: Abdomen is flat.     Palpations: Abdomen is soft.     Tenderness: There is no abdominal tenderness.  Musculoskeletal:        General: No deformity.  Skin:    General: Skin is warm.     Findings: No rash.  Neurological:     General: No focal deficit present.     Mental Status: She is alert.  Psychiatric:        Mood and Affect: Mood normal.     ED Results / Procedures / Treatments   Labs (all labs ordered are listed, but only abnormal results are displayed) Labs Reviewed - No data to display  EKG None  Radiology No results found.  Procedures Procedures    Medications Ordered in ED Medications - No data to display  ED Course/ Medical Decision Making/ A&P                             Medical Decision Making Risk Prescription drug management.   This patient presents to the ED for ear pain, this involves an extensive number of treatment options, and is a complaint  that carries with a high risk of complications and morbidity.  The differential diagnosis includes otitis media, otitis externa,.  This is not an exhaustive list.  Problem list/ ED course/ Critical interventions/ Medical management: HPI: See above Vital signs within normal range and stable throughout visit. Laboratory/imaging studies significant for: See above. On physical examination, patient is afebrile and appears in no acute distress. Erythema to the right ear external canal.  Exam and history most consistent with otitis external.  Right tympanic membrane is normal.  No diabetes or immunosuppression.  Low suspicion for mastoiditis, malignant otitis externa, acute otitis media.  Right tympanic membrane appears normal.  Will discharge patient with neomycin-polymyxin-dexamethasone which was recommended by [facility] pharmacist due to lower cost.  Advised patient to take Tylenol/ibuprofen for pain and follow-up with PCP or ENT within the week for reevaluation.  Strict return precautions discussed. I have reviewed the patient home medicines and have made adjustments as needed.  Cardiac monitoring/EKG: The patient was maintained on a cardiac monitor.  I personally reviewed and interpreted the cardiac monitor which showed an underlying rhythm of: sinus rhythm.  Additional history obtained: External records from outside source obtained and reviewed including: Chart review including previous notes, labs, imaging.  Consultations obtained:  Disposition Continued outpatient therapy. Follow-up with PCP recommended for reevaluation of symptoms. Treatment plan discussed with patient.  Pt acknowledged understanding was agreeable to the plan. Worrisome signs and symptoms were discussed with patient, and patient acknowledged understanding to return to the ED if they noticed these signs and symptoms. Patient was stable upon discharge.   This chart was dictated using voice recognition software.   Despite best efforts to proofread,  errors can occur which can change the documentation meaning.          Final Clinical Impression(s) / ED Diagnoses Final diagnoses:  Acute otitis externa of right ear, unspecified type    Rx / DC Orders ED Discharge Orders          Ordered    neomycin-polymyxin-hydrocortisone (CORTISPORIN) 3.5-10000-1 OTIC suspension  3 times daily        03/17/23 1447    neomycin-polymyxin b-dexamethasone (MAXITROL) 3.5-10000-0.1 SUSP  Every 6 hours       Note to Pharmacy: verbally  authorized for ear   03/17/23 1532              Jeanelle Malling, [place] 03/17/23 1636    Terald Sleeper, MD 03/18/23 [phone]

## 2023-03-17 NOTE — Discharge Instructions (Addendum)
Please use your eardrops as prescribed. Take tylenol/ibuprofen for pain. I recommend close follow-up with PCP or ENT in a week for reevaluation.  Please do not hesitate to return to emergency department if worrisome signs symptoms we discussed become apparent.

## 2023-04-06 ENCOUNTER — Other Ambulatory Visit: Payer: Self-pay

## 2023-04-06 ENCOUNTER — Emergency Department (HOSPITAL_BASED_OUTPATIENT_CLINIC_OR_DEPARTMENT_OTHER): Payer: Self-pay

## 2023-04-06 ENCOUNTER — Encounter (HOSPITAL_BASED_OUTPATIENT_CLINIC_OR_DEPARTMENT_OTHER): Payer: Self-pay | Admitting: Emergency Medicine

## 2023-04-06 ENCOUNTER — Emergency Department (HOSPITAL_BASED_OUTPATIENT_CLINIC_OR_DEPARTMENT_OTHER)
Admission: EM | Admit: 2023-04-06 | Discharge: 2023-04-06 | Disposition: A | Discharge status: Home / Self Care | Payer: Self-pay | Attending: Emergency Medicine | Admitting: Emergency Medicine

## 2023-04-06 DIAGNOSIS — S93402A Sprain of unspecified ligament of left ankle, initial encounter: Secondary | ICD-10-CM | POA: Insufficient documentation

## 2023-04-06 DIAGNOSIS — W1789XA Other fall from one level to another, initial encounter: Secondary | ICD-10-CM | POA: Insufficient documentation

## 2023-04-06 NOTE — ED Provider Notes (Signed)
[place] [facility] Provider Note   CSN: [id number] Arrival date & time: 04/06/23  1322     History  Chief Complaint  Patient presents with   Ankle Pain    Tammy Mejia is a 32 y.o. female with medical history of PID, migraines, HPV, depression, back pain, anxiety, anemia.  Patient presents to ED for evaluation of left ankle pain.  The patient reports that she was getting off of a lifted truck this morning when she rolled her left ankle.  She reports that she had immediate pain at this time and this occurred around 8 AM.  She reports that she went home and took Tylenol but did not alleviate her pain.  She states that she is having a hard time bearing weight on her left leg.  She denies falling or hitting her head.  Denies any other complaints.   Ankle Pain      Home Medications Prior to Admission medications   Medication Sig Start Date End Date Taking? Authorizing Provider  ferrous gluconate (FERGON) 240 (27 FE) MG tablet Take 1 tablet (240 mg total) by mouth every other day. 01/16/21   Levie Heritage, DO  medroxyPROGESTERone Acetate 150 MG/ML SUSY Inject 1 mL (150 mg total) into the muscle every 3 (three) months. 06/11/21   Levie Heritage, DO  medroxyPROGESTERone Acetate 150 MG/ML SUSY Inject 1 mL (150 mg total) into the muscle every 3 (three) months. 09/09/21   Levie Heritage, DO  medroxyPROGESTERone Acetate 150 MG/ML SUSY Inject 1 mL (150 mg total) into the muscle every 3 (three) months. 10/07/21   Levie Heritage, DO  neomycin-polymyxin b-dexamethasone (MAXITROL) 3.5-10000-0.1 SUSP Place 1 drop into the right ear every 6 (six) hours. 03/17/23   Jeanelle Malling, PA  neomycin-polymyxin-hydrocortisone (CORTISPORIN) 3.5-10000-1 OTIC suspension Place 4 drops into the right ear 3 (three) times daily. 03/17/23   Jeanelle Malling, PA      Allergies    Patient has no known allergies.    Review of Systems   Review of Systems  Musculoskeletal:  Positive for  arthralgias.  All other systems reviewed and are negative.   Physical Exam Updated Vital Signs BP 101/74 (BP Location: Right Arm)   Pulse 93   Temp 97.7 F (36.5 C)   Resp 18   Ht 5\' 1"  (1.549 m)   Wt 58.1 kg   LMP 03/28/2023   SpO2 99%   BMI 24.19 kg/m  Physical Exam Vitals and nursing note reviewed.  Constitutional:      General: She is not in acute distress.    Appearance: Normal appearance. She is not ill-appearing, toxic-appearing or diaphoretic.  HENT:     Head: Normocephalic and atraumatic.     Nose: Nose normal.     Mouth/Throat:     Mouth: Mucous membranes are moist.     Pharynx: Oropharynx is clear.  Eyes:     Extraocular Movements: Extraocular movements intact.     Conjunctiva/sclera: Conjunctivae normal.     Pupils: Pupils are equal, round, and reactive to light.  Cardiovascular:     Rate and Rhythm: Normal rate and regular rhythm.  Pulmonary:     Effort: Pulmonary effort is normal.     Breath sounds: Normal breath sounds. No wheezing.  Abdominal:     General: Abdomen is flat. Bowel sounds are normal.     Palpations: Abdomen is soft.     Tenderness: There is no abdominal tenderness.  Musculoskeletal:  Cervical back: Normal range of motion and neck supple. No tenderness.     Comments: No obvious deformity to patient left ankle.  Full range of motion.  No obvious swelling.  2+ DP pulse.  Brisk capillary refill.  Skin:    General: Skin is warm and dry.     Capillary Refill: Capillary refill takes less than 2 seconds.  Neurological:     Mental Status: She is alert and oriented to person, place, and time.     ED Results / Procedures / Treatments   Labs (all labs ordered are listed, but only abnormal results are displayed) Labs Reviewed - No data to display  EKG None  Radiology DG Ankle Complete Left  Result Date: 04/06/2023 CLINICAL DATA:  Injury EXAM: LEFT ANKLE COMPLETE - 3+ VIEW COMPARISON:  None Available. FINDINGS: There is no evidence of  fracture, dislocation, or joint effusion. There is no evidence of arthropathy or other focal bone abnormality. Soft tissues are unremarkable. IMPRESSION: Negative. Electronically Signed   By: Jasmine Pang M.D.   On: 04/06/2023 15:05    Procedures Procedures   Medications Ordered in ED Medications - No data to display  ED Course/ Medical Decision Making/ A&P  Medical Decision Making Amount and/or Complexity of Data Reviewed Radiology: ordered.   32 year old female presents to the ED for evaluation.  Please see HPI for further details.  On examination patient left ankle has no obvious deformity.  There is no obvious swelling.  There is no overlying skin change or bruising.  She has full range of motion of her ankle and tenderness to the lateral aspect of the left ankle.  She has brisk cap refill, 2+ DP pulse.  Patient plain film imaging of her left ankle is unremarkable.  Patient was placed in a cam boot and given crutches.  She will follow-up with her PCP for reevaluation and further management.  She was advised to continue taking ibuprofen and Tylenol at home.  She was also advised to initiate RICE protocol and this was explained to her.  She had all of her questions answered to her satisfaction.  She is stable to discharge over this time.   Final Clinical Impression(s) / ED Diagnoses Final diagnoses:  Sprain of left ankle, unspecified ligament, initial encounter    Rx / DC Orders ED Discharge Orders     None         Al Decant, PA-C 04/06/23 1542    Cathren Laine, MD 04/07/23 1329

## 2023-04-06 NOTE — Discharge Instructions (Addendum)
It was a pleasure taking part in your care today.  As we discussed, your x-ray of your left ankle was unremarkable.  You may have sprained your left ankle so we are placing you in a cam boot and given you crutches.  I would like for you to follow-up with your PCP for reevaluation in the next 3 days.  You may take Tylenol and ibuprofen for pain in the meantime.  You may apply ice as well.  Please remain in the cam boot while walking and utilize crutches as needed.  You may also employ RICE protocol which stands for rest, ice, compression and elevation.  Please sleep with your leg elevated and pillows propped up beneath to help with swelling.  Please read the attached guide concerning ankle sprains for further management and information.

## 2023-04-06 NOTE — ED Triage Notes (Signed)
Pt turned LT ankle today when she stepped down from a lifted truck

## 2023-04-21 ENCOUNTER — Other Ambulatory Visit (HOSPITAL_BASED_OUTPATIENT_CLINIC_OR_DEPARTMENT_OTHER): Payer: Self-pay

## 2023-06-22 ENCOUNTER — Other Ambulatory Visit (HOSPITAL_BASED_OUTPATIENT_CLINIC_OR_DEPARTMENT_OTHER): Payer: Self-pay

## 2023-09-16 ENCOUNTER — Encounter (HOSPITAL_BASED_OUTPATIENT_CLINIC_OR_DEPARTMENT_OTHER): Payer: Self-pay

## 2023-09-16 ENCOUNTER — Other Ambulatory Visit: Payer: Self-pay

## 2023-09-16 DIAGNOSIS — Z20822 Contact with and (suspected) exposure to covid-19: Secondary | ICD-10-CM | POA: Insufficient documentation

## 2023-09-16 DIAGNOSIS — J101 Influenza due to other identified influenza virus with other respiratory manifestations: Secondary | ICD-10-CM | POA: Insufficient documentation

## 2023-09-16 DIAGNOSIS — Z5321 Procedure and treatment not carried out due to patient leaving prior to being seen by health care provider: Secondary | ICD-10-CM | POA: Insufficient documentation

## 2023-09-16 LAB — RESP PANEL BY RT-PCR (RSV, FLU A&B, COVID)  RVPGX2
Influenza A by PCR: POSITIVE — AB
Influenza B by PCR: NEGATIVE
Resp Syncytial Virus by PCR: NEGATIVE
SARS Coronavirus 2 by RT PCR: NEGATIVE

## 2023-09-16 MED ORDER — ONDANSETRON 4 MG PO TBDP
ORAL_TABLET | ORAL | Status: AC
  Filled 2023-09-16: qty 1

## 2023-09-16 MED ORDER — ONDANSETRON 4 MG PO TBDP
4.0000 mg | ORAL_TABLET | Freq: Once | ORAL | Status: AC | PRN
  Administered 2023-09-16: 4 mg via ORAL

## 2023-09-16 NOTE — ED Triage Notes (Signed)
Pt arrives with c/o cough, congestion, n/v, headache, and bodyaches that started a few days. Pt denies fevers.

## 2023-09-17 ENCOUNTER — Emergency Department (HOSPITAL_BASED_OUTPATIENT_CLINIC_OR_DEPARTMENT_OTHER)
Admission: EM | Admit: 2023-09-17 | Discharge: 2023-09-17 | Disposition: A | Discharge status: Against Medical Advice | Payer: Self-pay | Attending: Emergency Medicine | Admitting: Emergency Medicine

## 2024-01-18 ENCOUNTER — Emergency Department (HOSPITAL_BASED_OUTPATIENT_CLINIC_OR_DEPARTMENT_OTHER)
Admission: EM | Admit: 2024-01-18 | Discharge: 2024-01-18 | Disposition: A | Discharge status: Home / Self Care | Payer: Self-pay | Attending: Emergency Medicine | Admitting: Emergency Medicine

## 2024-01-18 ENCOUNTER — Emergency Department (HOSPITAL_BASED_OUTPATIENT_CLINIC_OR_DEPARTMENT_OTHER): Payer: Self-pay

## 2024-01-18 ENCOUNTER — Encounter (HOSPITAL_BASED_OUTPATIENT_CLINIC_OR_DEPARTMENT_OTHER): Payer: Self-pay | Admitting: Emergency Medicine

## 2024-01-18 ENCOUNTER — Other Ambulatory Visit: Payer: Self-pay

## 2024-01-18 ENCOUNTER — Other Ambulatory Visit (HOSPITAL_BASED_OUTPATIENT_CLINIC_OR_DEPARTMENT_OTHER): Payer: Self-pay

## 2024-01-18 DIAGNOSIS — L03115 Cellulitis of right lower limb: Secondary | ICD-10-CM | POA: Insufficient documentation

## 2024-01-18 DIAGNOSIS — D72829 Elevated white blood cell count, unspecified: Secondary | ICD-10-CM | POA: Insufficient documentation

## 2024-01-18 LAB — CBC WITH DIFFERENTIAL/PLATELET
Abs Immature Granulocytes: 0.04 10*3/uL (ref 0.00–0.07)
Basophils Absolute: 0 10*3/uL (ref 0.0–0.1)
Basophils Relative: 0 %
Eosinophils Absolute: 0.1 10*3/uL (ref 0.0–0.5)
Eosinophils Relative: 1 %
HCT: 35.4 % — ABNORMAL LOW (ref 36.0–46.0)
Hemoglobin: 10.6 g/dL — ABNORMAL LOW (ref 12.0–15.0)
Immature Granulocytes: 0 %
Lymphocytes Relative: 10 %
Lymphs Abs: 1.2 10*3/uL (ref 0.7–4.0)
MCH: 23.9 pg — ABNORMAL LOW (ref 26.0–34.0)
MCHC: 29.9 g/dL — ABNORMAL LOW (ref 30.0–36.0)
MCV: 79.7 fL — ABNORMAL LOW (ref 80.0–100.0)
Monocytes Absolute: 1 10*3/uL (ref 0.1–1.0)
Monocytes Relative: 8 %
Neutro Abs: 9.4 10*3/uL — ABNORMAL HIGH (ref 1.7–7.7)
Neutrophils Relative %: 81 %
Platelets: 372 10*3/uL (ref 150–400)
RBC: 4.44 MIL/uL (ref 3.87–5.11)
RDW: 15.2 % (ref 11.5–15.5)
WBC: 11.7 10*3/uL — ABNORMAL HIGH (ref 4.0–10.5)
nRBC: 0 % (ref 0.0–0.2)

## 2024-01-18 LAB — COMPREHENSIVE METABOLIC PANEL WITH GFR
ALT: 9 U/L (ref 0–44)
AST: 16 U/L (ref 15–41)
Albumin: 3.9 g/dL (ref 3.5–5.0)
Alkaline Phosphatase: 87 U/L (ref 38–126)
Anion gap: 13 (ref 5–15)
BUN: 6 mg/dL (ref 6–20)
CO2: 20 mmol/L — ABNORMAL LOW (ref 22–32)
Calcium: 8.9 mg/dL (ref 8.9–10.3)
Chloride: 107 mmol/L (ref 98–111)
Creatinine, Ser: 0.65 mg/dL (ref 0.44–1.00)
GFR, Estimated: >60 mL/min (ref >60)
Glucose, Bld: 82 mg/dL (ref 70–99)
Potassium: 3.6 mmol/L (ref 3.5–5.1)
Sodium: 140 mmol/L (ref 135–145)
Total Bilirubin: 0.2 mg/dL (ref 0.0–1.2)
Total Protein: 7.2 g/dL (ref 6.5–8.1)

## 2024-01-18 LAB — PREGNANCY, URINE: Preg Test, Ur: NEGATIVE

## 2024-01-18 MED ORDER — DOXYCYCLINE HYCLATE 100 MG PO CAPS
100.0000 mg | ORAL_CAPSULE | Freq: Two times a day (BID) | ORAL | 0 refills | Status: DC
  Filled 2024-01-18: qty 14, 7d supply, fill #0

## 2024-01-18 NOTE — Discharge Instructions (Signed)
 X-ray was negative.  Take the doxycycline  as directed for the next few days.  Warm compresses to the pustule on the knee.  Return for any new or worse symptoms.  Would not expect much improvement initially but after 2 days there should be significant improvement.  Follow-up with your doctor as needed.

## 2024-01-18 NOTE — ED Triage Notes (Signed)
 C/o R knee swelling and redness since Sunday. Denies injuries.

## 2024-01-18 NOTE — ED Provider Notes (Addendum)
 [place] [facility] Provider Note   CSN: [id number] Arrival date & time: 01/18/24  8295     History  Chief Complaint  Patient presents with   Joint Swelling    Tammy Mejia is a 33 y.o. female.  Patient with a little bit of small infected skin cyst anterior portion of right knee, associated with redness and swelling since Sunday.  No direct injury to the knee.  Past medical history significant for hypokalemia migraines back pain anxiety scoliosis.  Patient denies any fevers.  Temperature 97.6 pulse 85 respirations 20 blood pressure 99/67 oxygen saturation is 96% on room air.  Patient is not concerned about being pregnant.       Home Medications Prior to Admission medications   Medication Sig Start Date End Date Taking? Authorizing Provider  ferrous gluconate  (FERGON) 240 (27 FE) MG tablet Take 1 tablet (240 mg total) by mouth every other day. 01/16/21   Stinson, Jacob J, DO  medroxyPROGESTERone  Acetate 150 MG/ML SUSY Inject 1 mL (150 mg total) into the muscle every 3 (three) months. 06/11/21   Stinson, Jacob J, DO  medroxyPROGESTERone  Acetate 150 MG/ML SUSY Inject 1 mL (150 mg total) into the muscle every 3 (three) months. 09/09/21   Stinson, Jacob J, DO  medroxyPROGESTERone  Acetate 150 MG/ML SUSY Inject 1 mL (150 mg total) into the muscle every 3 (three) months. 10/07/21   Stinson, Jacob J, DO  neomycin -polymyxin b-dexamethasone  (MAXITROL ) 3.5-10000-0.1 SUSP Place 1 drop into the right ear every 6 (six) hours. 03/17/23   Thomes Flicker, PA  neomycin -polymyxin-hydrocortisone (CORTISPORIN ) 3.5-10000-1 OTIC suspension Place 4 drops into the right ear 3 (three) times daily. 03/17/23   Thomes Flicker, PA      Allergies    Patient has no known allergies.    Review of Systems   Review of Systems  Constitutional:  Negative for chills and fever.  HENT:  Negative for ear pain and sore throat.   Eyes:  Negative for pain and visual disturbance.  Respiratory:   Negative for cough and shortness of breath.   Cardiovascular:  Negative for chest pain and palpitations.  Gastrointestinal:  Negative for abdominal pain and vomiting.  Genitourinary:  Negative for dysuria and hematuria.  Musculoskeletal:  Positive for joint swelling. Negative for arthralgias and back pain.  Skin:  Negative for color change and rash.  Neurological:  Negative for seizures and syncope.  All other systems reviewed and are negative.   Physical Exam Updated Vital Signs BP 99/67   Pulse 85   Temp 97.6 F (36.4 C) (Oral)   Resp 20   SpO2 96%  Physical Exam Vitals and nursing note reviewed.  Constitutional:      General: She is not in acute distress.    Appearance: She is well-developed. She is not ill-appearing.  HENT:     Head: Normocephalic and atraumatic.  Eyes:     Conjunctiva/sclera: Conjunctivae normal.  Cardiovascular:     Rate and Rhythm: Normal rate and regular rhythm.     Heart sounds: No murmur heard. Pulmonary:     Effort: Pulmonary effort is normal. No respiratory distress.     Breath sounds: Normal breath sounds.  Abdominal:     Palpations: Abdomen is soft.     Tenderness: There is no abdominal tenderness.  Musculoskeletal:        General: Swelling and tenderness present.     Cervical back: Neck supple.     Right lower leg: No edema.  Left lower leg: No edema.     Comments: Left knee normal.  Right knee with just below the patella with a little pustule and surrounding erythema measuring probably about 8 to 10 cm.  With some swelling to the knee.  No red streaking proximal or distal.  Neurovascularly intact to the right foot.  Skin:    General: Skin is warm and dry.     Capillary Refill: Capillary refill takes less than 2 seconds.  Neurological:     General: No focal deficit present.     Mental Status: She is alert and oriented to person, place, and time.  Psychiatric:        Mood and Affect: Mood normal.     ED Results / Procedures /  Treatments   Labs (all labs ordered are listed, but only abnormal results are displayed) Labs Reviewed  COMPREHENSIVE METABOLIC PANEL WITH GFR - Abnormal; Notable for the following components:      Result Value   CO2 20 (*)    All other components within normal limits  CBC WITH DIFFERENTIAL/PLATELET - Abnormal; Notable for the following components:   WBC 11.7 (*)    Hemoglobin 10.6 (*)    HCT 35.4 (*)    MCV 79.7 (*)    MCH 23.9 (*)    MCHC 29.9 (*)    Neutro Abs 9.4 (*)    All other components within normal limits  PREGNANCY, URINE    EKG None  Radiology No results found.  Procedures Procedures    Medications Ordered in ED Medications - No data to display  ED Course/ Medical Decision Making/ A&P                                 Medical Decision Making Amount and/or Complexity of Data Reviewed Labs: ordered. Radiology: ordered.   Clinically left knee cellulitis.  Doubt joint infection.  Patient's vital signs reassuring.  White count is elevated some at 11.7.  Hemoglobin 10.6 patient's complete metabolic panel is normal.  To include renal function.  Will also check pregnancy hCG most likely will treat with doxycycline .  Will get x-rays of the knee to evaluate of any bony abnormalities.  X-ray of the knee just shows moderate subcutaneous edema.  Which fits clinically.  No bony abnormalities or evidence of osteomyelitis.  Patient's pregnancy test is still pending.  Pregnancy test is negative.  Will go ahead and prescribe doxycycline .  Patient needs to leave right now because she needs to pick her daughter up at school.   Final Clinical Impression(s) / ED Diagnoses Final diagnoses:  Cellulitis of right knee    Rx / DC Orders ED Discharge Orders     None         Nicklas Barns, MD 01/18/24 1003    Nicklas Barns, MD 01/18/24 1033    Nicklas Barns, MD 01/18/24 1036

## 2024-05-07 ENCOUNTER — Emergency Department (HOSPITAL_BASED_OUTPATIENT_CLINIC_OR_DEPARTMENT_OTHER): Payer: Self-pay

## 2024-05-07 ENCOUNTER — Encounter (HOSPITAL_BASED_OUTPATIENT_CLINIC_OR_DEPARTMENT_OTHER): Payer: Self-pay | Admitting: Emergency Medicine

## 2024-05-07 ENCOUNTER — Emergency Department (HOSPITAL_BASED_OUTPATIENT_CLINIC_OR_DEPARTMENT_OTHER)
Admission: EM | Admit: 2024-05-07 | Discharge: 2024-05-07 | Disposition: A | Discharge status: Home / Self Care | Payer: Self-pay | Attending: Emergency Medicine | Admitting: Emergency Medicine

## 2024-05-07 ENCOUNTER — Other Ambulatory Visit (HOSPITAL_BASED_OUTPATIENT_CLINIC_OR_DEPARTMENT_OTHER): Payer: Self-pay

## 2024-05-07 ENCOUNTER — Other Ambulatory Visit: Payer: Self-pay

## 2024-05-07 DIAGNOSIS — R0789 Other chest pain: Secondary | ICD-10-CM | POA: Insufficient documentation

## 2024-05-07 DIAGNOSIS — M62838 Other muscle spasm: Secondary | ICD-10-CM | POA: Insufficient documentation

## 2024-05-07 DIAGNOSIS — R202 Paresthesia of skin: Secondary | ICD-10-CM | POA: Insufficient documentation

## 2024-05-07 LAB — BASIC METABOLIC PANEL WITH GFR
Anion gap: 11 (ref 5–15)
BUN: 7 mg/dL (ref 6–20)
CO2: 24 mmol/L (ref 22–32)
Calcium: 8.8 mg/dL — ABNORMAL LOW (ref 8.9–10.3)
Chloride: 103 mmol/L (ref 98–111)
Creatinine, Ser: 0.66 mg/dL (ref 0.44–1.00)
GFR, Estimated: >60 mL/min (ref >60)
Glucose, Bld: 92 mg/dL (ref 70–99)
Potassium: 3.7 mmol/L (ref 3.5–5.1)
Sodium: 137 mmol/L (ref 135–145)

## 2024-05-07 LAB — TROPONIN T, HIGH SENSITIVITY: Troponin T High Sensitivity: <15 ng/L (ref 0–19)

## 2024-05-07 LAB — CBC
HCT: 33.6 % — ABNORMAL LOW (ref 36.0–46.0)
Hemoglobin: 10.3 g/dL — ABNORMAL LOW (ref 12.0–15.0)
MCH: 23.8 pg — ABNORMAL LOW (ref 26.0–34.0)
MCHC: 30.7 g/dL (ref 30.0–36.0)
MCV: 77.6 fL — ABNORMAL LOW (ref 80.0–100.0)
Platelets: 327 K/uL (ref 150–400)
RBC: 4.33 MIL/uL (ref 3.87–5.11)
RDW: 13.9 % (ref 11.5–15.5)
WBC: 11.1 K/uL — ABNORMAL HIGH (ref 4.0–10.5)
nRBC: 0 % (ref 0.0–0.2)

## 2024-05-07 LAB — PREGNANCY, URINE: Preg Test, Ur: NEGATIVE

## 2024-05-07 MED ORDER — CYCLOBENZAPRINE HCL 10 MG PO TABS
10.0000 mg | ORAL_TABLET | Freq: Two times a day (BID) | ORAL | 0 refills | Status: AC | PRN
Start: 2024-05-07 — End: ?
  Filled 2024-05-07: qty 20, 10d supply, fill #0

## 2024-05-07 MED ORDER — KETOROLAC TROMETHAMINE 15 MG/ML IJ SOLN
15.0000 mg | Freq: Once | INTRAMUSCULAR | Status: AC
  Administered 2024-05-07: 15 mg via INTRAVENOUS
  Filled 2024-05-07: qty 1

## 2024-05-07 MED ORDER — NAPROXEN 500 MG PO TABS
500.0000 mg | ORAL_TABLET | Freq: Two times a day (BID) | ORAL | 0 refills | Status: AC
Start: 2024-05-07 — End: ?
  Filled 2024-05-07: qty 30, 15d supply, fill #0

## 2024-05-07 NOTE — ED Provider Notes (Signed)
 [place] [facility]  Provider Note  CSN: [id number] Arrival date & time: 05/07/24 0007  History Chief Complaint  Patient presents with   Numbness   Chest Pain    Tammy Mejia is a 33 y.o. female reports tingling and heaviness in her RUE off and on starting around 1930hrs. She has also had some chest pains off and on for 'awhile'. She would not have come for evaluation but her father convinced her to come get checked out. She has had some pain in R shoulder radiating down her arm as well. No falls or injuries. She has two young children at home   Home Medications Prior to Admission medications   Medication Sig Start Date End Date Taking? Authorizing Provider  cyclobenzaprine  (FLEXERIL ) 10 MG tablet Take 1 tablet (10 mg total) by mouth 2 (two) times daily as needed for muscle spasms. 05/07/24  Yes Roselyn Carlin KATHEE, MD  naproxen  (NAPROSYN ) 500 MG tablet Take 1 tablet (500 mg total) by mouth 2 (two) times daily. 05/07/24  Yes Roselyn Carlin KATHEE, MD     Allergies    Patient has no known allergies.   Review of Systems   Review of Systems Please see HPI for pertinent positives and negatives  Physical Exam BP 95/75   Pulse 80   Temp 98.5 F (36.9 C) (Oral)   Resp 18   Ht 5' 1 (1.549 m)   Wt 54.4 kg   SpO2 100%   BMI 22.67 kg/m   Physical Exam Vitals and nursing note reviewed.  Constitutional:      Appearance: Normal appearance.  HENT:     Head: Normocephalic and atraumatic.     Nose: Nose normal.     Mouth/Throat:     Mouth: Mucous membranes are moist.  Eyes:     Extraocular Movements: Extraocular movements intact.     Conjunctiva/sclera: Conjunctivae normal.  Cardiovascular:     Rate and Rhythm: Normal rate.     Pulses: Normal pulses.  Pulmonary:     Effort: Pulmonary effort is normal.     Breath sounds: Normal breath sounds.  Abdominal:     General: Abdomen is flat.     Palpations: Abdomen is soft.     Tenderness:  There is no abdominal tenderness.  Musculoskeletal:        General: Tenderness (R trapezius muscle tense) present. No swelling. Normal range of motion.     Cervical back: Neck supple.  Skin:    General: Skin is warm and dry.  Neurological:     General: No focal deficit present.     Mental Status: She is alert and oriented to person, place, and time.     Cranial Nerves: No cranial nerve deficit.     Sensory: No sensory deficit.     Motor: No weakness.  Psychiatric:        Mood and Affect: Mood normal.     ED Results / Procedures / Treatments   EKG EKG Interpretation Date/Time:  Monday May 07 2024 00:20:56 EDT Ventricular Rate:  70 PR Interval:  151 QRS Duration:  87 QT Interval:  389 QTC Calculation: 420 R Axis:   73  Text Interpretation: Sinus arrhythmia No significant change since last tracing Confirmed by Roselyn Carlin [phone]) on 05/07/2024 12:49:36 AM  Procedures Procedures  Medications Ordered in the ED Medications  ketorolac  (TORADOL ) 15 MG/ML injection 15 mg (15 mg Intravenous Given 05/07/24 0120)    Initial Impression and  Plan  Patient here with R arm aching, tingling most consistent with trapezius spasm and maybe mild radiculopathy. Chest pain is apparently a chronic problem and not her primary reason for ED visit. Neuro exam is normal. Labs done in triage show CBC with anemia at baseline, HCG is neg. I personally viewed the images from radiology studies and agree with radiologist interpretation: CXR is clear  ED Course   Clinical Course as of 05/07/24 0143  Mon May 07, 2024  0141 BMP and Trop are normal. No signs of ACS or other acute cardiac or central neurologic process. Recommend NSAIDs, muscle relaxer, heat, massage and rest. PCP follow up, RTED for any other concerns.   [CS]    Clinical Course User Index [CS] Roselyn Carlin NOVAK, MD     MDM Rules/Calculators/A&P Medical Decision Making Problems Addressed: Atypical chest pain: chronic illness or  injury Paresthesia: acute illness or injury Trapezius muscle spasm: acute illness or injury  Amount and/or Complexity of Data Reviewed Labs: ordered. Decision-making details documented in ED Course. Radiology: ordered and independent interpretation performed. Decision-making details documented in ED Course. ECG/medicine tests: ordered and independent interpretation performed. Decision-making details documented in ED Course.  Risk Prescription drug management.     Final Clinical Impression(s) / ED Diagnoses Final diagnoses:  Paresthesia  Trapezius muscle spasm  Atypical chest pain    Rx / DC Orders ED Discharge Orders          Ordered    naproxen  (NAPROSYN ) 500 MG tablet  2 times daily        05/07/24 0142    cyclobenzaprine  (FLEXERIL ) 10 MG tablet  2 times daily PRN        05/07/24 0142             Roselyn Carlin NOVAK, MD 05/07/24 [phone]

## 2024-05-07 NOTE — ED Triage Notes (Signed)
 Pt reports right arm numbness and chest pain since 1930 last night

## 2024-05-18 ENCOUNTER — Other Ambulatory Visit (HOSPITAL_BASED_OUTPATIENT_CLINIC_OR_DEPARTMENT_OTHER): Payer: Self-pay
